# Patient Record
Sex: Female | Born: 1970 | Race: Black or African American | Hispanic: No | Marital: Married | Smoking: Former smoker
Health system: Southern US, Community
[De-identification: ages and names within clinical notes are randomized; demographics above are authoritative.]

## PROBLEM LIST (undated history)

## (undated) DIAGNOSIS — R42 Dizziness and giddiness: Secondary | ICD-10-CM

## (undated) DIAGNOSIS — I10 Essential (primary) hypertension: Secondary | ICD-10-CM

## (undated) DIAGNOSIS — E119 Type 2 diabetes mellitus without complications: Secondary | ICD-10-CM

## (undated) HISTORY — DX: Essential (primary) hypertension: I10

## (undated) HISTORY — PX: BUNIONECTOMY: SHX129

## (undated) HISTORY — PX: HERNIA REPAIR: SHX51

## (undated) HISTORY — DX: Dizziness and giddiness: R42

## (undated) HISTORY — DX: Type 2 diabetes mellitus without complications: E11.9

## (undated) HISTORY — PX: SHOULDER ARTHROSCOPY: SHX128

## (undated) HISTORY — PX: ABDOMINAL HYSTERECTOMY: SHX81

## (undated) HISTORY — PX: CHOLECYSTECTOMY: SHX55

---

## 2002-03-09 ENCOUNTER — Encounter: Admission: RE | Admit: 2002-03-09 | Discharge: 2002-03-09 | Payer: Self-pay | Admitting: *Deleted

## 2002-04-30 ENCOUNTER — Other Ambulatory Visit: Admission: RE | Admit: 2002-04-30 | Discharge: 2002-04-30 | Payer: Self-pay | Admitting: *Deleted

## 2002-04-30 ENCOUNTER — Encounter: Admission: RE | Admit: 2002-04-30 | Discharge: 2002-04-30 | Payer: Self-pay | Admitting: *Deleted

## 2002-05-14 ENCOUNTER — Encounter: Admission: RE | Admit: 2002-05-14 | Discharge: 2002-05-14 | Payer: Self-pay | Admitting: *Deleted

## 2012-05-25 ENCOUNTER — Other Ambulatory Visit (HOSPITAL_COMMUNITY)
Admission: RE | Admit: 2012-05-25 | Discharge: 2012-05-25 | Disposition: A | Discharge status: Home / Self Care | Payer: Self-pay | Source: Ambulatory Visit | Attending: Obstetrics and Gynecology | Admitting: Obstetrics and Gynecology

## 2012-05-25 ENCOUNTER — Encounter: Payer: Self-pay | Admitting: Obstetrics and Gynecology

## 2012-05-25 ENCOUNTER — Ambulatory Visit (INDEPENDENT_AMBULATORY_CARE_PROVIDER_SITE_OTHER): Payer: Self-pay | Admitting: Obstetrics and Gynecology

## 2012-05-25 VITALS — BP 143/95 | HR 67 | Temp 98.9°F | Ht 60.0 in

## 2012-05-25 DIAGNOSIS — D259 Leiomyoma of uterus, unspecified: Secondary | ICD-10-CM | POA: Insufficient documentation

## 2012-05-25 DIAGNOSIS — N920 Excessive and frequent menstruation with regular cycle: Secondary | ICD-10-CM | POA: Insufficient documentation

## 2012-05-25 DIAGNOSIS — I10 Essential (primary) hypertension: Secondary | ICD-10-CM | POA: Insufficient documentation

## 2012-05-25 DIAGNOSIS — R102 Pelvic and perineal pain: Secondary | ICD-10-CM | POA: Insufficient documentation

## 2012-05-25 HISTORY — DX: Leiomyoma of uterus, unspecified: D25.9

## 2012-05-25 HISTORY — DX: Essential (primary) hypertension: I10

## 2012-05-25 LAB — POCT URINALYSIS DIP (DEVICE)
Glucose, UA: NEGATIVE mg/dL
Hgb urine dipstick: NEGATIVE
Nitrite: NEGATIVE
Urobilinogen, UA: 0.2 mg/dL (ref 0.0–1.0)
pH: 5.5 (ref 5.0–8.0)

## 2012-05-25 NOTE — Progress Notes (Signed)
Subjective:    Patient ID: Lauren Soto, female    DOB: 15-Mar-1971, 41 y.o.   MRN: [id number]  HPI 41 yo presenting as a referral from [facility] for management of menorrhagia and pelvic pain. Patient describes monthly menses that over the past year have lasted for 2 weeks. Prior to her pregnancy 3 years ago, patient had normal cycles lasting 4 days. Following her delivery, she was started on Implanon and reported irregular menses. Following removal of Implanon, patient was started on Micronor secondary to hypertension and has experienced menses that last 2 weeks and are heavy in flow. Patient reports onset of pelvic pain over the past 2-3 months. It starts in the suprapubic area and radiates to the bilateral lower quadrants. She reports frequency and need to strain to urinate. She also reports some constipation. Pain does not seem to have an association with her cycle and is present on a daily basis. She denies any alleviating factors (she tried ibuprofen which put her to sleep) and any worsening factors.   Past Medical History  Diagnosis Date  . Hypertension   . Diabetes mellitus without complication     gestational   Past Surgical History  Procedure Date  . Shoulder arthroscopy   . Cholecystectomy   . Bunionectomy    Family History  Problem Relation Age of Onset  . Hypertension Mother   . Hypertension Father    History  Substance Use Topics  . Smoking status: Former Smoker    Types: Cigarettes  . Smokeless tobacco: Never Used  . Alcohol Use: No     Review of Systems  All other systems reviewed and are negative.       Objective:   Physical Exam  GENERAL: Well-developed, well-nourished female in no acute distress.  HEENT: Normocephalic, atraumatic. Sclerae anicteric.  NECK: Supple. Normal thyroid.  LUNGS: Clear to auscultation bilaterally.  HEART: Regular rate and rhythm. ABDOMEN: Soft, nontender, nondistended. No organomegaly. PELVIC: Normal external  female genitalia. Vagina is pink and rugated.  Normal discharge. Normal appearing cervix. Uterus is normal in size. No adnexal mass or tenderness. EXTREMITIES: No cyanosis, clubbing, or edema, 2+ distal pulses.     04/29/2012 ultrasound- 1.3 cm probable fibroid in uterine fundus without any submucosal component. 3.7 cm simple left ovarian cyst. Right ovary not visualized Assessment & Plan:  41 yo with menorrhagia and fibroid uterus - Discussed need to rule out endometrial cancer with endometrial biopsy.  ENDOMETRIAL BIOPSY     The indications for endometrial biopsy were reviewed.   Risks of the biopsy including cramping, bleeding, infection, uterine perforation, inadequate specimen and need for additional procedures  were discussed. The patient states she understands and agrees to undergo procedure today. Consent was signed. Time out was performed. Urine HCG was negative. A sterile speculum was placed in the patient's vagina and the cervix was prepped with Betadine. A single-toothed tenaculum was placed on the anterior lip of the cervix to stabilize it. The uterine cavity was sounded to a depth of 9 cm using the uterine sound. The 3 mm pipelle was introduced into the endometrial cavity without difficulty, 2 passes were made.  A  moderate amount of tissue was  sent to pathology. The instruments were removed from the patient's vagina. Minimal bleeding from the cervix was noted. The patient tolerated the procedure well.  Routine post-procedure instructions were given to the patient. The patient will follow up in two weeks to review the results and for further management.   -  Discussed medical management with Mirena IUD or depo-provera. Patient seemed to have a preference for Mirena IUD. - A UA was obtained and found to be negative. It was explained to the patient that if her cycles normalize but her pain persists, she will have to be seen by her primary care physician for further evaluation of her  pain. - RTC in 2 weeks for results and further management.

## 2012-05-25 NOTE — Patient Instructions (Signed)

## 2012-06-08 ENCOUNTER — Encounter: Payer: Self-pay | Admitting: Obstetrics and Gynecology

## 2012-06-08 ENCOUNTER — Ambulatory Visit (INDEPENDENT_AMBULATORY_CARE_PROVIDER_SITE_OTHER): Payer: Self-pay | Admitting: Obstetrics and Gynecology

## 2012-06-08 VITALS — BP 153/112 | HR 103 | Temp 99.0°F | Ht 60.0 in | Wt 187.0 lb

## 2012-06-08 DIAGNOSIS — R102 Pelvic and perineal pain unspecified side: Secondary | ICD-10-CM | POA: Insufficient documentation

## 2012-06-08 DIAGNOSIS — N920 Excessive and frequent menstruation with regular cycle: Secondary | ICD-10-CM

## 2012-06-08 DIAGNOSIS — N949 Unspecified condition associated with female genital organs and menstrual cycle: Secondary | ICD-10-CM

## 2012-06-08 DIAGNOSIS — D259 Leiomyoma of uterus, unspecified: Secondary | ICD-10-CM

## 2012-06-08 HISTORY — DX: Pelvic and perineal pain: R10.2

## 2012-06-08 HISTORY — DX: Excessive and frequent menstruation with regular cycle: N92.0

## 2012-06-08 NOTE — Progress Notes (Signed)
Patient ID: Lauren Soto, female   DOB: [birth year], 41 y.o.   MRN: [id number] 41 yo G2P2 with menorrhagia presenting today for results of endometrial biopsy. Results of biopsy were discussed and explained to the patient which demonstrated polypoid type- endometrium and benign for hyperplasia and malignancy.  Patient desires to proceed with Mirena IUD as previously discussed.  RTC for IUD insertion

## 2014-04-21 DIAGNOSIS — J45909 Unspecified asthma, uncomplicated: Secondary | ICD-10-CM | POA: Insufficient documentation

## 2014-04-21 HISTORY — DX: Unspecified asthma, uncomplicated: J45.909

## 2014-05-02 ENCOUNTER — Encounter: Payer: Self-pay | Admitting: Obstetrics and Gynecology

## 2014-08-11 ENCOUNTER — Other Ambulatory Visit (HOSPITAL_COMMUNITY): Payer: Self-pay | Admitting: Family Medicine

## 2014-08-11 ENCOUNTER — Ambulatory Visit (HOSPITAL_COMMUNITY)
Admission: RE | Admit: 2014-08-11 | Discharge: 2014-08-11 | Disposition: A | Discharge status: Home / Self Care | Payer: 59 | Source: Ambulatory Visit | Attending: Family Medicine | Admitting: Family Medicine

## 2014-08-11 DIAGNOSIS — M7989 Other specified soft tissue disorders: Secondary | ICD-10-CM | POA: Diagnosis not present

## 2015-08-23 IMAGING — US US EXTREM LOW*L* LIMITED
1 series · 14 of 25 positions shown · non-contrast
Comparison: Opposite lower leg.

CLINICAL DATA: Swelling of the left leg.

EXAM:
ULTRASOUND LEFT LOWER EXTREMITY LIMITED
TECHNIQUE: Ultrasound examination of the lower extremity soft tissues was
performed in the area of clinical concern.

[Series 1: us extrem low*left* limited · 0.08mm/px · 14 of 36 slices shown]
[im 1/36]
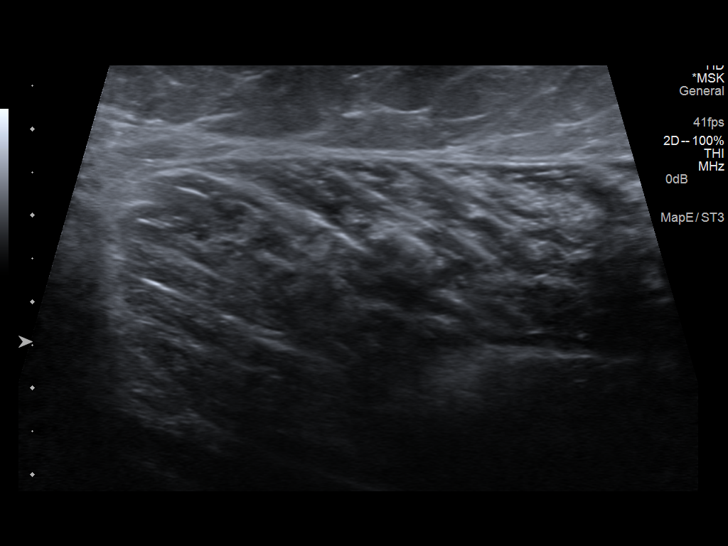
[im 3/36]
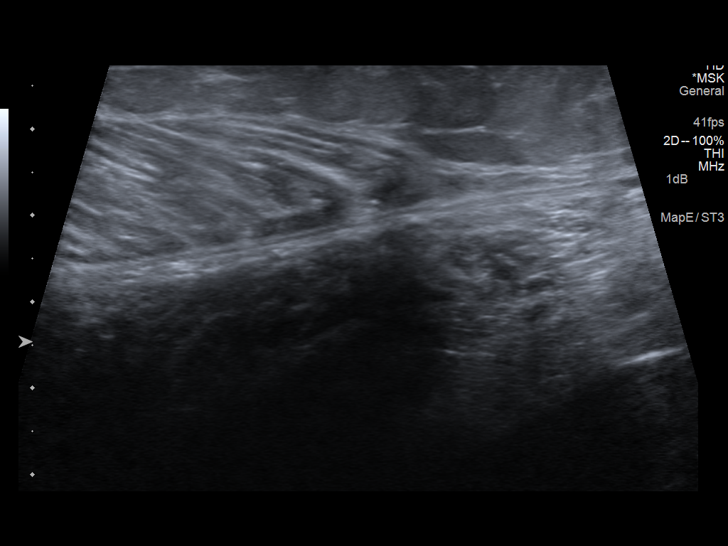
[im 6/36]
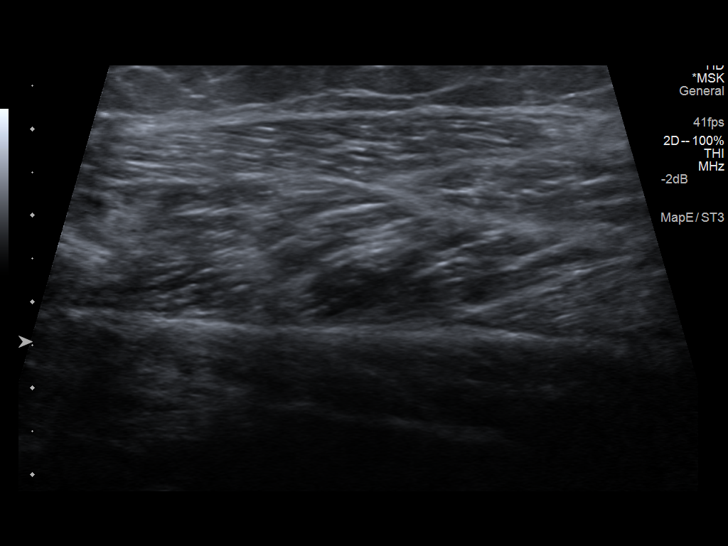
[im 9/36]
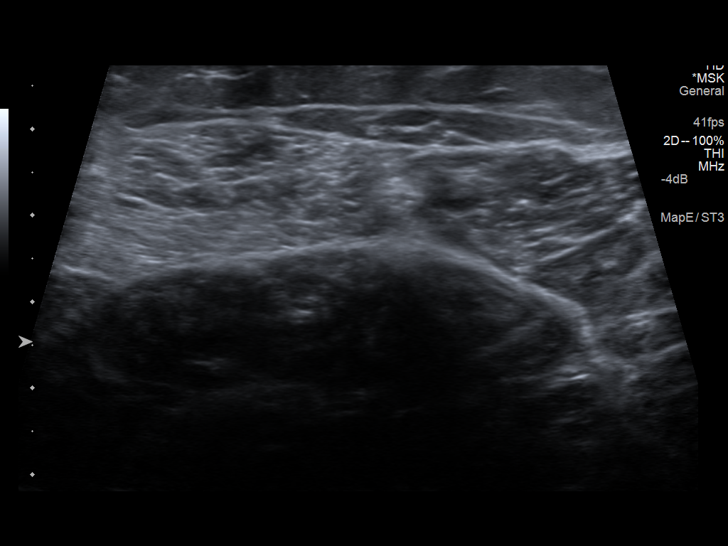
[im 12/36]
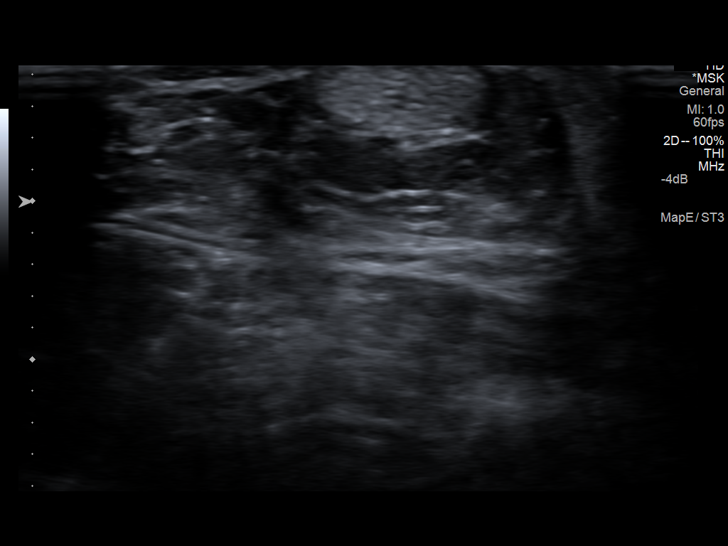
[im 14/36]
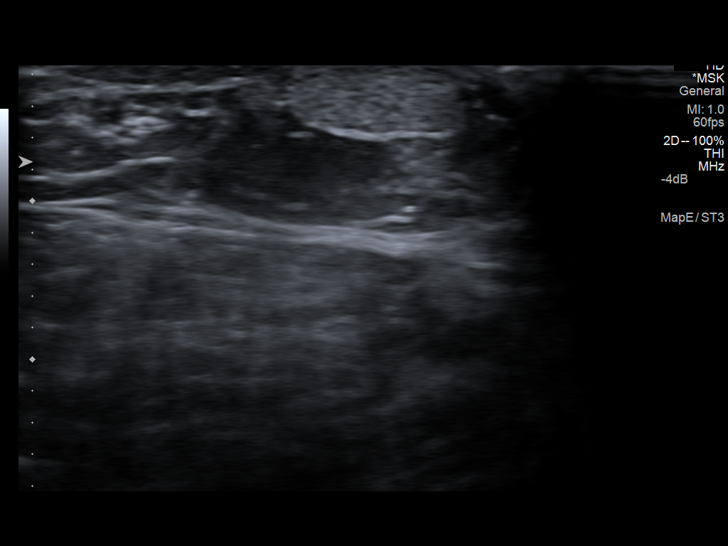
[im 17/36]
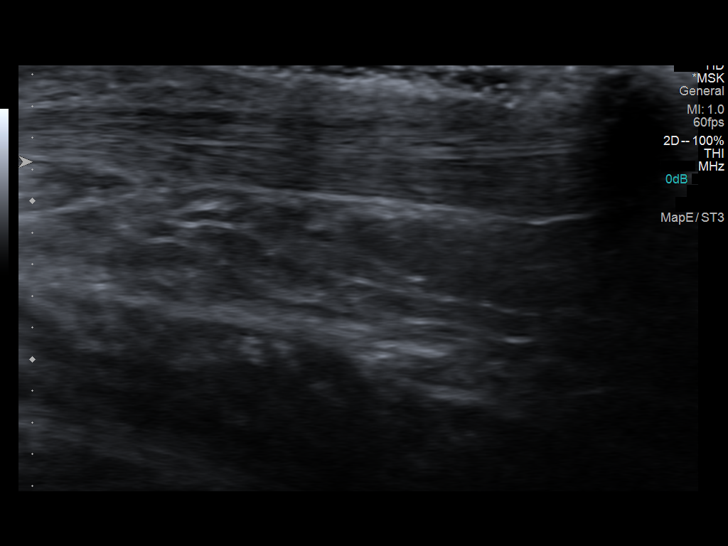
[im 19/36]
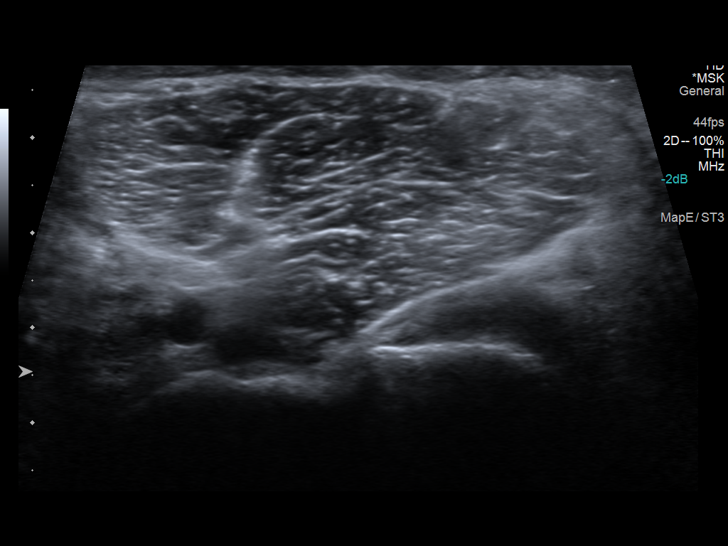
[im 22/36]
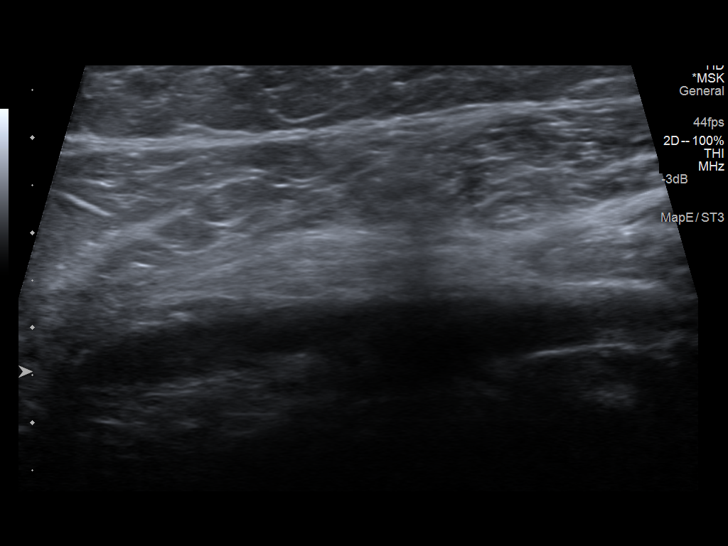
[im 24/36]
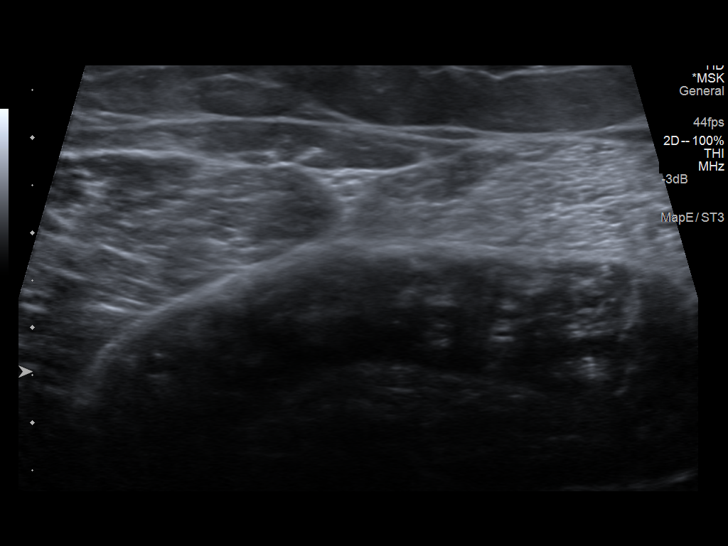
[im 27/36]
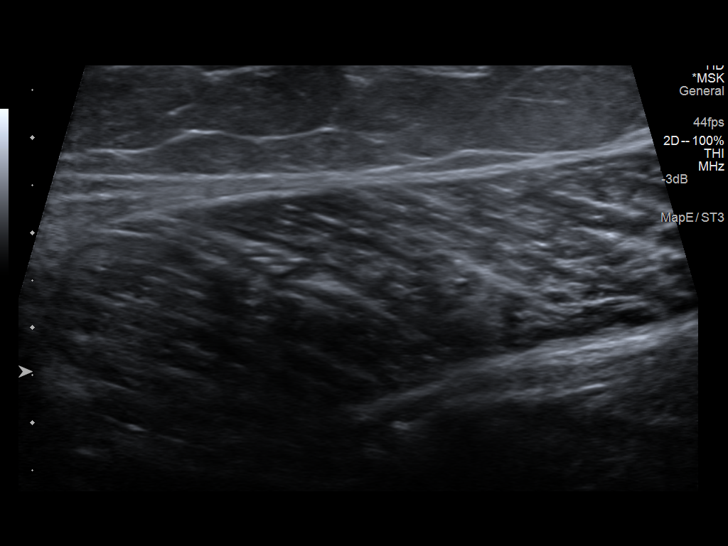
[im 30/36]
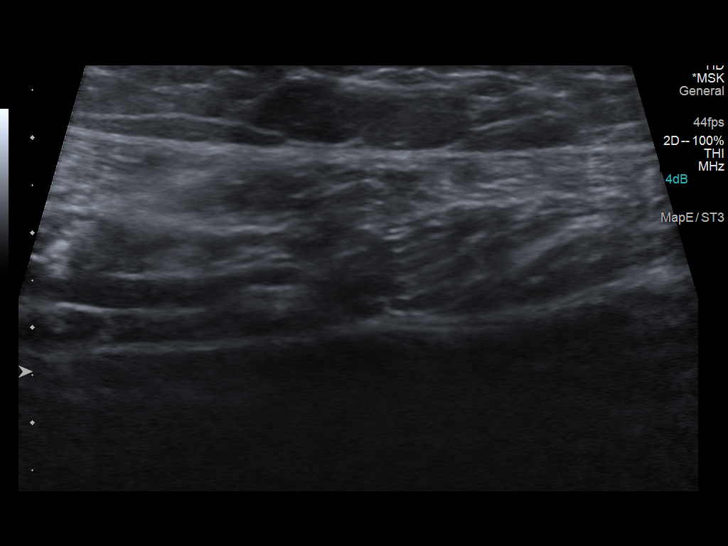
[im 33/36]
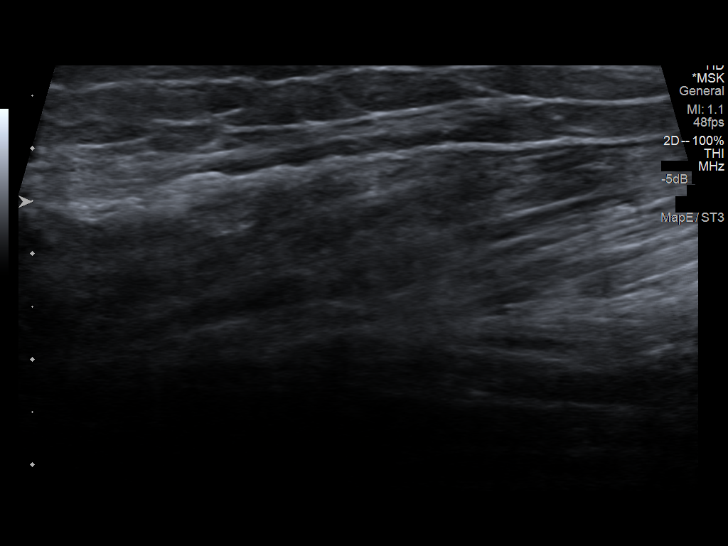
[im 36/36]
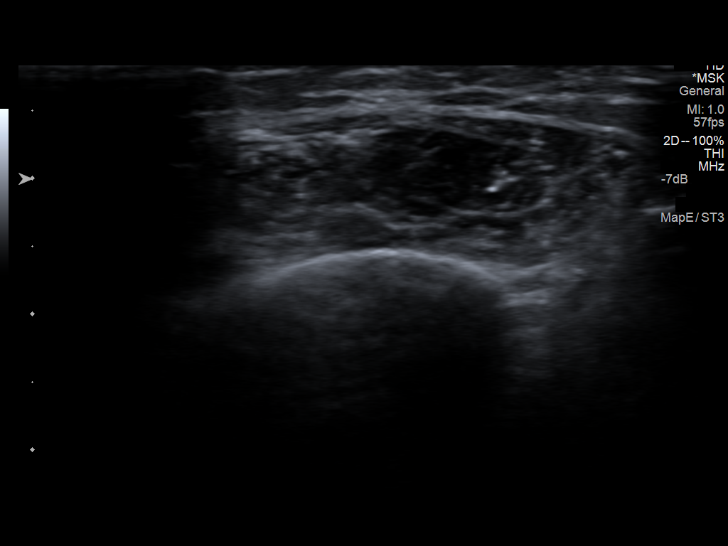

[14 of 25 positions shown; findings below may reference images not displayed]

FINDINGS: The gastrocnemius muscles and Achilles tendon and soleus muscle
appear normal. There is no abscess or mass or fluid in the soft
tissues. There is no Baker's cyst around the knee.
IMPRESSION: No visible abnormality of the soft tissues of the left lower leg. I
do not find an explanation for the patient's lower leg swelling.

## 2017-12-11 ENCOUNTER — Other Ambulatory Visit: Payer: Self-pay

## 2017-12-11 ENCOUNTER — Emergency Department (HOSPITAL_BASED_OUTPATIENT_CLINIC_OR_DEPARTMENT_OTHER): Payer: Self-pay

## 2017-12-11 ENCOUNTER — Emergency Department (HOSPITAL_BASED_OUTPATIENT_CLINIC_OR_DEPARTMENT_OTHER)
Admission: EM | Admit: 2017-12-11 | Discharge: 2017-12-11 | Disposition: A | Discharge status: Home / Self Care | Payer: Self-pay | Attending: Emergency Medicine | Admitting: Emergency Medicine

## 2017-12-11 ENCOUNTER — Encounter (HOSPITAL_BASED_OUTPATIENT_CLINIC_OR_DEPARTMENT_OTHER): Payer: Self-pay

## 2017-12-11 DIAGNOSIS — R079 Chest pain, unspecified: Secondary | ICD-10-CM | POA: Insufficient documentation

## 2017-12-11 DIAGNOSIS — Z79899 Other long term (current) drug therapy: Secondary | ICD-10-CM | POA: Insufficient documentation

## 2017-12-11 DIAGNOSIS — Z87891 Personal history of nicotine dependence: Secondary | ICD-10-CM | POA: Insufficient documentation

## 2017-12-11 DIAGNOSIS — I10 Essential (primary) hypertension: Secondary | ICD-10-CM | POA: Insufficient documentation

## 2017-12-11 LAB — COMPREHENSIVE METABOLIC PANEL
ALK PHOS: 95 U/L (ref 38–126)
ALT: 12 U/L — ABNORMAL LOW (ref 14–54)
ANION GAP: 9 (ref 5–15)
AST: 17 U/L (ref 15–41)
Albumin: 3.9 g/dL (ref 3.5–5.0)
BILIRUBIN TOTAL: 0.4 mg/dL (ref 0.3–1.2)
BUN: 15 mg/dL (ref 6–20)
CALCIUM: 9.4 mg/dL (ref 8.9–10.3)
CO2: 27 mmol/L (ref 22–32)
Chloride: 101 mmol/L (ref 101–111)
Creatinine, Ser: 0.9 mg/dL (ref 0.44–1.00)
GFR calc Af Amer: >60 mL/min (ref >60)
GFR calc non Af Amer: >60 mL/min (ref >60)
Glucose, Bld: 97 mg/dL (ref 65–99)
POTASSIUM: 3.5 mmol/L (ref 3.5–5.1)
SODIUM: 137 mmol/L (ref 135–145)
TOTAL PROTEIN: 7.7 g/dL (ref 6.5–8.1)

## 2017-12-11 LAB — CBC WITH DIFFERENTIAL/PLATELET
BASOS ABS: 0 10*3/uL (ref 0.0–0.1)
Basophils Relative: 0 %
EOS ABS: 0.1 10*3/uL (ref 0.0–0.7)
Eosinophils Relative: 1 %
HEMATOCRIT: 36.1 % (ref 36.0–46.0)
HEMOGLOBIN: 12.4 g/dL (ref 12.0–15.0)
Lymphocytes Relative: 29 %
Lymphs Abs: 2.3 10*3/uL (ref 0.7–4.0)
MCH: 29.7 pg (ref 26.0–34.0)
MCHC: 34.3 g/dL (ref 30.0–36.0)
MCV: 86.4 fL (ref 78.0–100.0)
Monocytes Absolute: 0.7 10*3/uL (ref 0.1–1.0)
Monocytes Relative: 8 %
NEUTROS ABS: 4.9 10*3/uL (ref 1.7–7.7)
NEUTROS PCT: 62 %
Platelets: 301 10*3/uL (ref 150–400)
RBC: 4.18 MIL/uL (ref 3.87–5.11)
RDW: 13.5 % (ref 11.5–15.5)
WBC: 8 10*3/uL (ref 4.0–10.5)

## 2017-12-11 LAB — D-DIMER, QUANTITATIVE: D-Dimer, Quant: 0.35 ug/mL-FEU (ref 0.00–0.50)

## 2017-12-11 LAB — TROPONIN I: Troponin I: <0.03 ng/mL (ref <0.03)

## 2017-12-11 MED ORDER — KETOROLAC TROMETHAMINE 30 MG/ML IJ SOLN
30.0000 mg | Freq: Once | INTRAMUSCULAR | Status: AC
  Administered 2017-12-11: 30 mg via INTRAVENOUS
  Filled 2017-12-11: qty 1

## 2017-12-11 MED ORDER — METHOCARBAMOL 500 MG PO TABS: 500.0000 mg | ORAL_TABLET | Freq: Two times a day (BID) | ORAL | 0 refills | Status: DC

## 2017-12-11 NOTE — ED Triage Notes (Signed)
Centralized chest pain started yesterday while at work. Radiated to left arm and left side of neck. Now reports pain started again 30 minutes ago, constant. Reports shortness of breath. Reports nausea.

## 2017-12-11 NOTE — ED Provider Notes (Signed)
[place] EMERGENCY DEPARTMENT Provider Note   CSN: [id number] Arrival date & time: 12/11/17  1425     History   Chief Complaint Chief Complaint  Patient presents with  . Chest Pain    HPI Lauren Soto is a 47 y.o. female with PMH/o HTN, DM who presents for evaluation of intermittent chest pressure that began yesterday while working.  Patient reports that yesterday, she started having some diffuse chest pressure while she was taking close.  Patient reports that it was worse when she was moving, twisting her torso or moving her arms.  Patient reports that she sat down and after about 30 minutes, pain went away on her own.  Patient reports she had been experiencing some left-sided neck pain that she describes as a "sharp shooting pain" that went down her left upper extremity.  No numbness/weakness.  No preceding trauma, injury, fall.  Patient reports that today, she was at work hanging up close when she felt the same chest pressure began approximately 1:30 PM.  Patient reports that she was still having pain so she came to the emergency department.  Patient reports that she does like she is having to take "shallow breaths because it is hurting" which may make her have some difficulty breathing.  She is also reporting that she is having some mild numbness.  Denies any vomiting.  Patient states she did not have any diaphoresis or nausea with the pain.  She states that the pain was not worse with exertion.  She does report some worsening pain with deep inspiration.  Patient reports she has seen a cardiologist previously but does not know why she had saw them.  No history of MIs.  She is not a current smoker denies any cocaine use. She denies any OCP use, recent immobilization, prior history of DVT/PE, recent surgery, leg swelling, or long travel.  Patient denies any family history of heart attacks.  Patient denies any recent fevers, headaches, vision changes, vomiting, abdominal pain.  The  history is provided by the patient.    Past Medical History:  Diagnosis Date  . Diabetes mellitus without complication ([place])    gestational  . Hypertension     Patient Active Problem List   Diagnosis Date Noted  . Menorrhagia 06/08/2012  . Pelvic pain in female 06/08/2012  . Hypertension 05/25/2012  . Uterine fibroid 05/25/2012    Past Surgical History:  Procedure Laterality Date  . BUNIONECTOMY    . CHOLECYSTECTOMY    . SHOULDER ARTHROSCOPY       OB History    Gravida  2   Para  2   Term  2   Preterm  0   AB  0   Living  2     SAB  0   TAB  0   Ectopic  0   Multiple  0   Live Births               Home Medications    Prior to Admission medications   Medication Sig Start Date End Date Taking? Authorizing Provider  ferrous fumarate (HEMOCYTE - 106 MG FE) 325 (106 FE) MG TABS Take 1 tablet by mouth.    [provider]  lisinopril (PRINIVIL,ZESTRIL) 20 MG tablet Take 20 mg by mouth daily.    [provider]  methocarbamol (ROBAXIN) 500 MG tablet Take 1 tablet (500 mg total) by mouth 2 (two) times daily. 12/11/17   Volanda Napoleon, PA-C  metoprolol  succinate (TOPROL-XL) 50 MG 24 hr tablet Take 50 mg by mouth daily. Take with or immediately following a meal.    [provider]    Family History Family History  Problem Relation Age of Onset  . Hypertension Mother   . Hypertension Father     Social History Social History   Tobacco Use  . Smoking status: Former Smoker    Types: Cigarettes  . Smokeless tobacco: Never Used  Substance Use Topics  . Alcohol use: No  . Drug use: No     Allergies   Patient has no known allergies.   Review of Systems Review of Systems  Constitutional: Negative for fever.  Respiratory: Positive for shortness of breath. Negative for cough.   Cardiovascular: Positive for chest pain. Negative for leg swelling.  Gastrointestinal: Positive for nausea. Negative for abdominal pain and  vomiting.  Genitourinary: Negative for dysuria and hematuria.  Neurological: Negative for headaches.  All other systems reviewed and are negative.    Physical Exam Updated Vital Signs BP 102/68 (BP Location: Right Arm)   Pulse 60   Temp 98.8 F (37.1 C) (Oral)   Resp 16   Ht 5' (1.524 m)   Wt 86.2 kg (190 lb)   LMP 06/04/2012   SpO2 97%   BMI 37.11 kg/m   Physical Exam  Constitutional: She is oriented to person, place, and time. She appears well-developed and well-nourished.  HENT:  Head: Normocephalic and atraumatic.  Mouth/Throat: Oropharynx is clear and moist and mucous membranes are normal.  Eyes: Pupils are equal, round, and reactive to light. Conjunctivae, EOM and lids are normal.  Neck: Full passive range of motion without pain.  Full flexion/extension and lateral movement of neck fully intact.  Tenderness palpation noted to the left-sided paraspinal muscles of the cervical region.  Positive Spurling's maneuver.  No bony midline tenderness. No deformities or crepitus.   Cardiovascular: Normal rate, regular rhythm, normal heart sounds and normal pulses. Exam reveals no gallop and no friction rub.  No murmur heard. Pulses:      Carotid pulses are 2+ on the right side, and 2+ on the left side.      Dorsalis pedis pulses are 2+ on the right side, and 2+ on the left side.  Pulmonary/Chest: Effort normal and breath sounds normal.  Lungs clear to auscultation bilaterally.  Symmetric chest rise.  No wheezing, rales, rhonchi.  Tenderness palpation noted to the anterior chest wall which reproduces patient's pain.  Additionally, pain is reproduced with movement of her upper extremities.  No deformity or crepitus noted.  Abdominal: Soft. Normal appearance. There is no tenderness. There is no rigidity and no guarding.  Musculoskeletal: Normal range of motion.  Bilateral lower extremities are symmetric in appearance without any overlying edema, erythema.  Neurological: She is alert  and oriented to person, place, and time.  Skin: Skin is warm and dry. Capillary refill takes less than 2 seconds.  Psychiatric: She has a normal mood and affect. Her speech is normal.  Nursing note and vitals reviewed.   ED Treatments / Results  Labs (all labs ordered are listed, but only abnormal results are displayed) Labs Reviewed  COMPREHENSIVE METABOLIC PANEL - Abnormal; Notable for the following components:      Result Value   ALT 12 (*)    All other components within normal limits  CBC WITH DIFFERENTIAL/PLATELET  TROPONIN I  D-DIMER, QUANTITATIVE (NOT AT [facility])  TROPONIN I    EKG None   EKG  shows normal sinus rhythm, rate 66.  QTc is 421.  Radiology Dg Chest 2 View  Result Date: 12/11/2017 CLINICAL DATA:  Chest pain and shortness of breath EXAM: CHEST - 2 VIEW COMPARISON:  August 28, 2011 FINDINGS: There is no edema or consolidation. The heart size and pulmonary vascularity are normal. No adenopathy. No pneumothorax. No bone lesions. IMPRESSION: No edema or consolidation. Electronically Signed   By: Lowella Grip III M.D.   On: 12/11/2017 15:31    Procedures Procedures (including critical care time)  Medications Ordered in ED Medications  ketorolac (TORADOL) 30 MG/ML injection 30 mg (30 mg Intravenous Given 12/11/17 1647)     Initial Impression / Assessment and Plan / ED Course  I have reviewed the triage vital signs and the nursing notes.  Pertinent labs & imaging results that were available during my care of the patient were reviewed by me and considered in my medical decision making (see chart for details).     47 year old female who presents for evaluation of chest pressure.  Initially began yesterday but resolved.  Reports it returned today approxi-1:30 PM had some nausea with it.  Also reports that she feels like she is having to take shallow breaths because the pain.  Also reports she had some left-sided neck and arm pain prior to onset of symptoms.  Patient is afebrile, non-toxic appearing, sitting comfortably on examination table. Vital signs reviewed and stable.  On exam, pain is reproduced with palpation of anterior chest wall and with movement of her upper extremities.  Additionally, patient has positive Spurling's maneuver.  Suspect the patient may be having some radiculopathy pain that is concerning the neck and arm pain.  It sounds like this pain was going on before the chest pain so that it may not be related to her chest pain at all.  Additionally, consider ACS etiology versus infectious etiology versus electrolyte imbalance.  Low suspicion for PE but given deep inspiration component, also consideration.  Plan to check basic labs, EKG, chest x-ray.  Troponin negative.  CBC without any significant leukocytosis, anemia.  CMP without any acute abnormality.  D-dimer negative.  Chest x-ray negative for any acute infectious etiology.  Given patient's risk factors, presentation, she has a heart score of 3.  Will plan to delta troponin.  Updated patient on plan.  She reports some improvement in pain after analgesics.  Vital signs are stable.  We will plan for delta troponin.  Repeat troponin negative.  Discussed results with patient.  Vital signs are stable.  Suspect that this is musculoskeletal strain given distribution of pain and reproducible with palpation.  We will plan to treat with Robaxin.  Patient is here to follow-up with primary care doctor in the next 2 to 4 days for further evaluation.  At this time, do not suspect any acute life-threatening presentation.  Patient stable for discharge at this time. Patient had ample opportunity for questions and discussion. All patient's questions were answered with full understanding. Strict return precautions discussed. Patient expresses understanding and agreement to plan.   Final Clinical Impressions(s) / ED Diagnoses   Final diagnoses:  Chest pain, unspecified type    ED Discharge Orders         Ordered    methocarbamol (ROBAXIN) 500 MG tablet  2 times daily     12/11/17 1842       Volanda Napoleon, PA-C 12/12/17 0114    Tanna Furry, MD 12/20/17 2315

## 2017-12-11 NOTE — Discharge Instructions (Signed)
You can take Tylenol or Ibuprofen as directed for pain. You can alternate Tylenol and Ibuprofen every 4 hours. If you take Tylenol at 1pm, then you can take Ibuprofen at 5pm. Then you can take Tylenol again at 9pm.   Take Robaxin as prescribed. This medication will make you drowsy so do not drive or drink alcohol when taking it.  Follow-up with your primary doctor or the referred [facility].   Return to the Emergency Department immediately if you experiencing worsening chest pain, difficulty breathing, nausea/vomiting, get very sweaty, headache or any other worsening or concerning symptoms.

## 2018-06-18 DIAGNOSIS — E119 Type 2 diabetes mellitus without complications: Secondary | ICD-10-CM

## 2018-06-18 HISTORY — DX: Type 2 diabetes mellitus without complications: E11.9

## 2018-07-06 DIAGNOSIS — G43809 Other migraine, not intractable, without status migrainosus: Secondary | ICD-10-CM | POA: Insufficient documentation

## 2018-07-06 HISTORY — DX: Other migraine, not intractable, without status migrainosus: G43.809

## 2018-12-23 IMAGING — CR DG CHEST 2V
2 series · 2 of 2 positions shown · non-contrast
Comparison: August 28, 2011

CLINICAL DATA: Chest pain and shortness of breath

EXAM:
CHEST - 2 VIEW

[w chest pa]
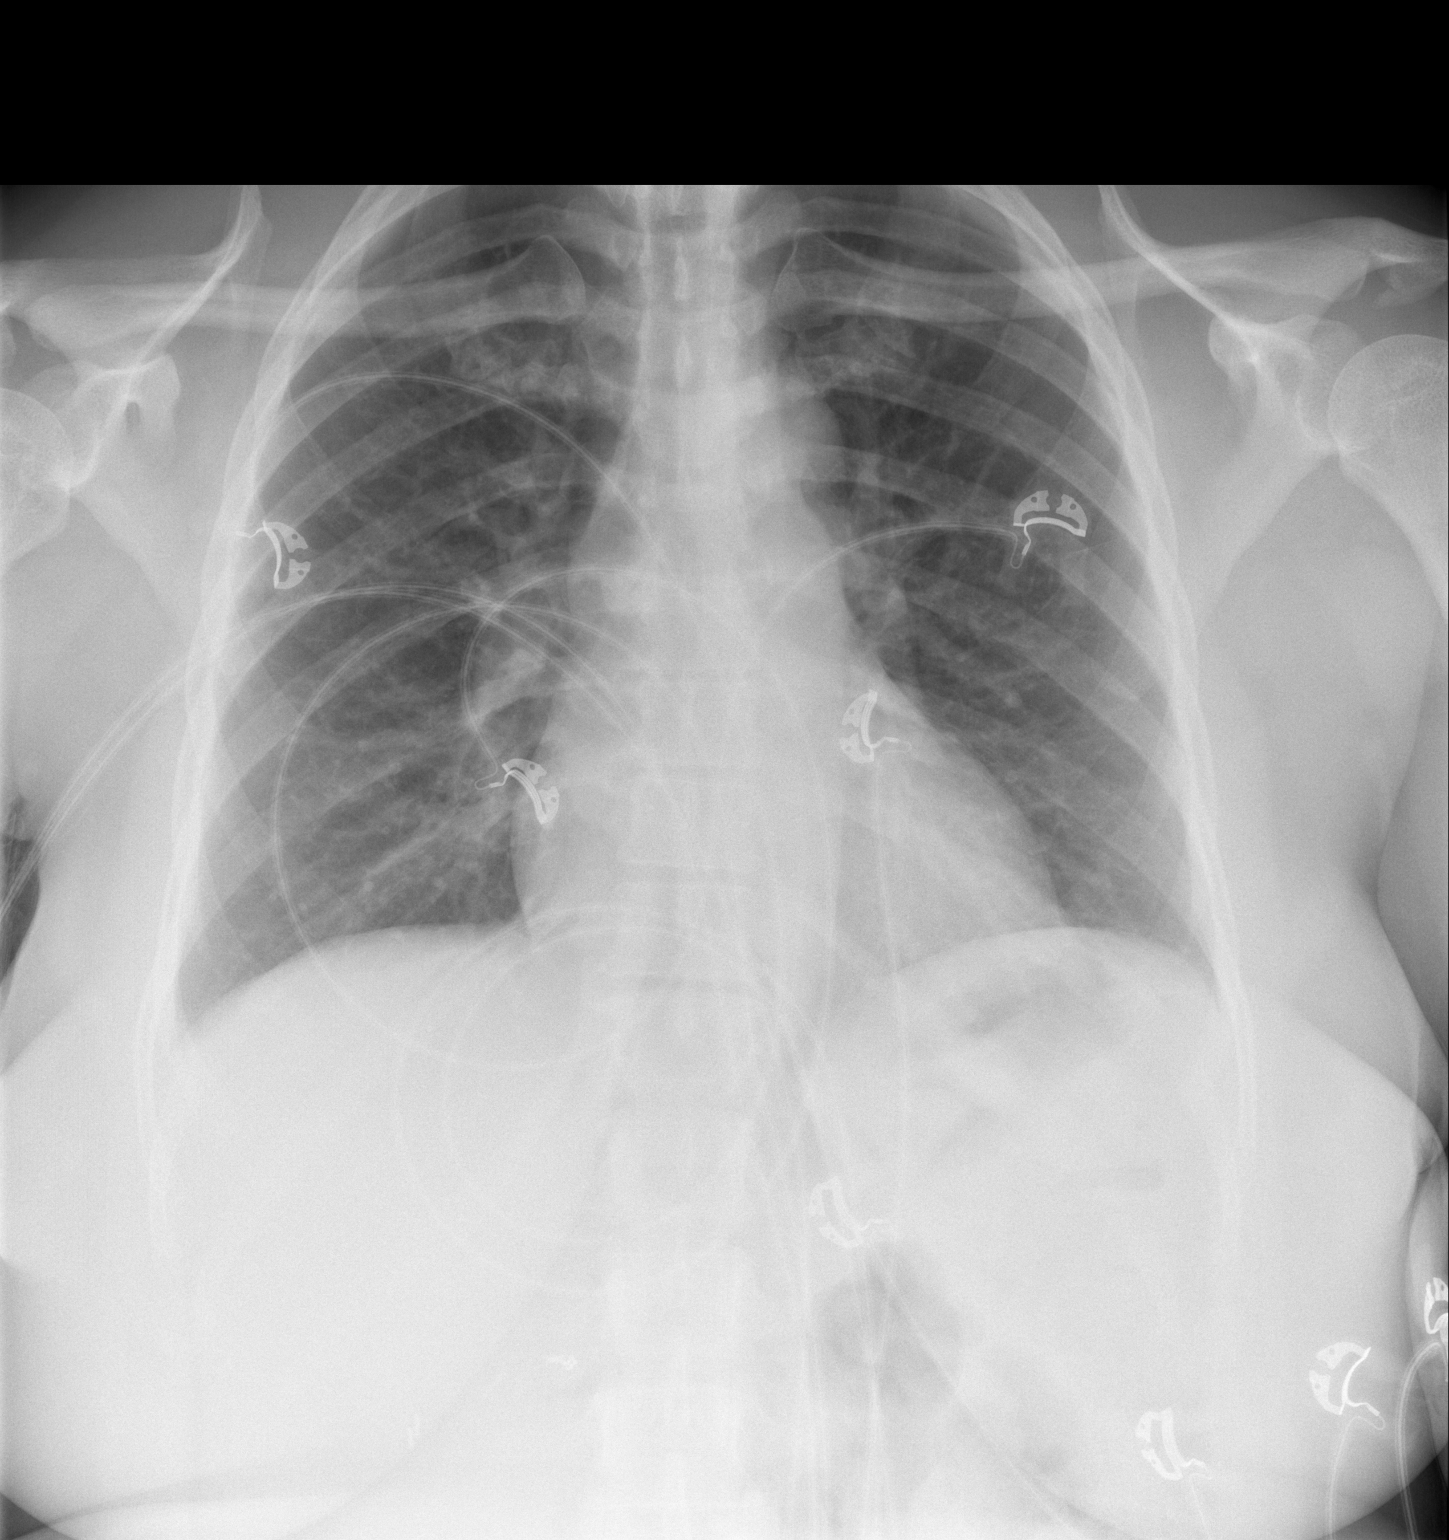

[w chest lat]
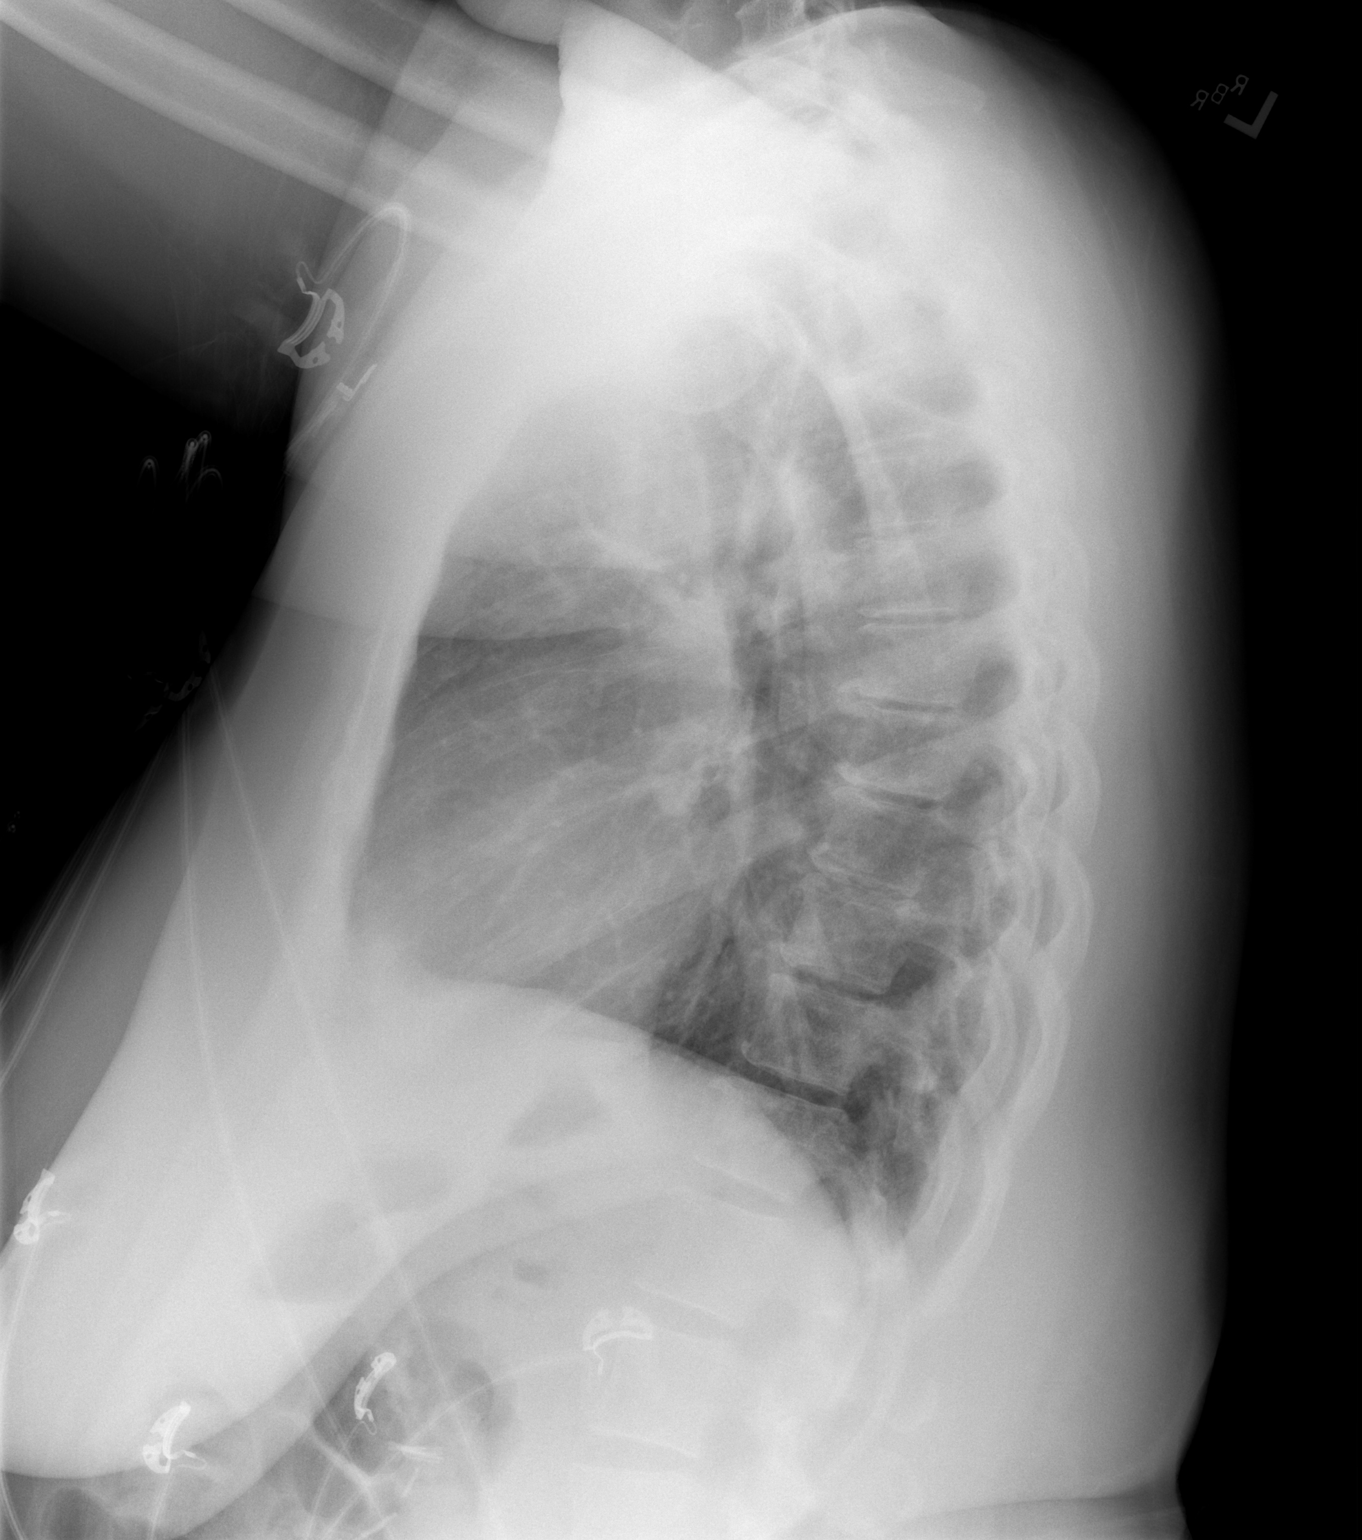

[2 of 2 positions shown; findings below may reference images not displayed]

FINDINGS: There is no edema or consolidation. The heart size and pulmonary
vascularity are normal. No adenopathy. No pneumothorax. No bone
lesions.
IMPRESSION: No edema or consolidation.

## 2020-10-01 ENCOUNTER — Encounter (HOSPITAL_BASED_OUTPATIENT_CLINIC_OR_DEPARTMENT_OTHER): Payer: Self-pay | Admitting: *Deleted

## 2020-10-01 ENCOUNTER — Emergency Department (HOSPITAL_BASED_OUTPATIENT_CLINIC_OR_DEPARTMENT_OTHER)
Admission: EM | Admit: 2020-10-01 | Discharge: 2020-10-01 | Disposition: A | Discharge status: Home / Self Care | Payer: Self-pay | Attending: Physician Assistant | Admitting: Physician Assistant

## 2020-10-01 ENCOUNTER — Other Ambulatory Visit: Payer: Self-pay

## 2020-10-01 DIAGNOSIS — G8929 Other chronic pain: Secondary | ICD-10-CM | POA: Insufficient documentation

## 2020-10-01 DIAGNOSIS — E119 Type 2 diabetes mellitus without complications: Secondary | ICD-10-CM | POA: Insufficient documentation

## 2020-10-01 DIAGNOSIS — Z87891 Personal history of nicotine dependence: Secondary | ICD-10-CM | POA: Insufficient documentation

## 2020-10-01 DIAGNOSIS — M79602 Pain in left arm: Secondary | ICD-10-CM | POA: Insufficient documentation

## 2020-10-01 DIAGNOSIS — Z79899 Other long term (current) drug therapy: Secondary | ICD-10-CM | POA: Insufficient documentation

## 2020-10-01 DIAGNOSIS — M2550 Pain in unspecified joint: Secondary | ICD-10-CM | POA: Insufficient documentation

## 2020-10-01 DIAGNOSIS — I10 Essential (primary) hypertension: Secondary | ICD-10-CM | POA: Insufficient documentation

## 2020-10-01 DIAGNOSIS — M542 Cervicalgia: Secondary | ICD-10-CM | POA: Insufficient documentation

## 2020-10-01 DIAGNOSIS — M79601 Pain in right arm: Secondary | ICD-10-CM | POA: Insufficient documentation

## 2020-10-01 MED ORDER — CYCLOBENZAPRINE HCL 10 MG PO TABS: 10.0000 mg | ORAL_TABLET | Freq: Two times a day (BID) | ORAL | 0 refills | Status: DC | PRN

## 2020-10-01 NOTE — ED Triage Notes (Signed)
'  upper body pain' x several weeks. Bilateral arm, neck pain. Pt has been seen by provider and told that she had carpal tunnel. Ambulatory. No acute distress noted.

## 2020-10-01 NOTE — ED Provider Notes (Signed)
[facility] EMERGENCY DEPARTMENT Provider Note   CSN: [id number] Arrival date & time: 10/01/20  1044     History Chief Complaint  Patient presents with  . Generalized Body Aches    Lauren Soto is a 50 y.o. female with past medical history of HTN who presents the ED with complaints of bilateral arm and neck discomfort.  I reviewed patient's medical record and she was evaluated by [facility] at [facility] on 07/06/2020 for her symptoms of bilateral arm and hand numbness and tingling discomfort.  She reported that her symptoms have been going on since her 90s, but has been increasingly problematic for her.  Her symptoms had not improved with meloxicam or gabapentin.  She was offered EMG/NCV to evaluate for carpal tunnel syndrome, but she declined.  That was his suspected diagnosis.  She was encouraged to return to the clinic in 2 months.  She was also started on Lyrica.  On my examination, patient states that she has been having continued if not worsening pain in her bilateral arms and bilateral side of neck.  No new traumas.  She states that she is working on paperwork to help with financial costs of proceeding with EMG with her neurologist.  She plans to call them to schedule appointment for ongoing evaluation early next week.  She also states that she has been having joint pains diffusely.  Denies any redness or severe pain, but states that they are "achy".  This has been going on for months.  She is concerned for possible autoimmune or inflammatory disorder.  She has never been seen by rheumatology.  She does have a primary care provider with [facility] and [place], [place].  She understands that she needs to see them to as her blood pressure was mildly elevated here in the ED.    She states that she has been taking her pregabalin, with no significant relief.  She has not been on any steroids or continued NSAIDs.  She states that she has spasm discomfort in her upper  neck and feels tense.  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  She denies any chest pain, difficult breathing, no recent injuries, inability to move her neck, blurred vision, headache, or other neurologic deficits.   HPI     Past Medical History:  Diagnosis Date  . Diabetes mellitus without complication ([place])    gestational  . Hypertension     Patient Active Problem List   Diagnosis Date Noted  . Menorrhagia 06/08/2012  . Pelvic pain in female 06/08/2012  . Hypertension 05/25/2012  . Uterine fibroid 05/25/2012    Past Surgical History:  Procedure Laterality Date  . BUNIONECTOMY    . CHOLECYSTECTOMY    . SHOULDER ARTHROSCOPY       OB History    Gravida  2   Para  2   Term  2   Preterm  0   AB  0   Living  2     SAB  0   IAB  0   Ectopic  0   Multiple  0   Live Births              Family History  Problem Relation Age of Onset  . Hypertension Mother   . Hypertension Father     Social History   Tobacco Use  . Smoking status: Former Smoker    Types: Cigarettes  . Smokeless tobacco:  Never Used  Substance Use Topics  . Alcohol use: No  . Drug use: No    Home Medications Prior to Admission medications   Medication Sig Start Date End Date Taking? Authorizing Provider  cyclobenzaprine (FLEXERIL) 10 MG tablet Take 1 tablet (10 mg total) by mouth 2 (two) times daily as needed for muscle spasms. 10/01/20  Yes Corena Herter, PA-C  ferrous fumarate (HEMOCYTE - 106 MG FE) 325 (106 FE) MG TABS Take 1 tablet by mouth.    [provider]  lisinopril (PRINIVIL,ZESTRIL) 20 MG tablet Take 20 mg by mouth daily.    [provider]  methocarbamol (ROBAXIN) 500 MG tablet Take 1 tablet (500 mg total) by mouth 2 (two) times daily. 12/11/17   Volanda Napoleon, PA-C  metoprolol succinate (TOPROL-XL) 50 MG 24 hr tablet Take 50 mg by mouth daily. Take with or immediately following a meal.     [provider]    Allergies    Patient has no known allergies.  Review of Systems   Review of Systems  All other systems reviewed and are negative.   Physical Exam Updated Vital Signs BP (!) 155/108 (BP Location: Left Arm) Comment: noncompliant with BP meds  Pulse 75   Temp 98.2 F (36.8 C) (Oral)   Resp 18   Ht 5' (1.524 m)   Wt 88.5 kg   LMP 06/04/2012   SpO2 96%   BMI 38.08 kg/m   Physical Exam Vitals and nursing note reviewed. Exam conducted with a chaperone present.  Constitutional:      Appearance: Normal appearance.  HENT:     Head: Normocephalic and atraumatic.  Eyes:     General: No scleral icterus.    Conjunctiva/sclera: Conjunctivae normal.  Cardiovascular:     Rate and Rhythm: Normal rate.     Pulses: Normal pulses.  Pulmonary:     Effort: Pulmonary effort is normal. No respiratory distress.  Musculoskeletal:        General: Tenderness present. No swelling, deformity or signs of injury. Normal range of motion.     Comments: Tenderness over trapezius bilaterally.  No significant midline cervical pain.  No overlying skin changes.  No masses.  No midline spinal tenderness. Positive Tinel's sign bilaterally.  Tenderness over biceps bilaterally.  No redness or overlying skin changes.  No swelling or inflammatory changes.  She is able to demonstrate full ROM, albeit slowed due to discomfort.  She denies weakness and insists that the pain is limiting factor.  Sensation is grossly intact bilaterally and peripherally.  Radial pulses intact and symmetric.  Skin:    General: Skin is dry.  Neurological:     Mental Status: She is alert.     GCS: GCS eye subscore is 4. GCS verbal subscore is 5. GCS motor subscore is 6.  Psychiatric:        Mood and Affect: Mood normal.        Behavior: Behavior normal.        Thought Content: Thought content normal.     ED Results / Procedures / Treatments   Labs (all labs ordered are listed, but only abnormal results  are displayed) Labs Reviewed - No data to display  EKG None  Radiology No results found.  Procedures Procedures   Medications Ordered in ED Medications - No data to display  ED Course  I have reviewed the triage vital signs and the nursing notes.  Pertinent labs & imaging results that were available during  my care of the patient were reviewed by me and considered in my medical decision making (see chart for details).    MDM Rules/Calculators/A&P                          Lauren Soto was evaluated in Emergency Department on 10/01/2020 for the symptoms described in the history of present illness. She was evaluated in the context of the global COVID-19 pandemic, which necessitated consideration that the patient might be at risk for infection with the SARS-CoV-2 virus that causes COVID-19. Institutional protocols and algorithms that pertain to the evaluation of patients at risk for COVID-19 are in a state of rapid change based on information released by regulatory bodies including the CDC and federal and state organizations. These policies and algorithms were followed during the patient's care in the ED.  I personally reviewed patient's medical chart and all notes from triage and staff during today's encounter. I have also ordered and reviewed all labs and imaging that I felt to be medically necessary in the evaluation of this patient's complaints and with consideration of their physical exam. If needed, translation services were available and utilized.   Patient with chronic pains in her arms bilaterally.  No concerning findings on physical exam.  She also endorses generalized achy joints and is concerned for autoimmune disorder.  Cannot exclude RA or other autoimmune polyarthropathy, will refer to rheumatology for ongoing evaluation and management.  In the interim, will prescribe Flexeril as she states that this has helped her symptoms in the past.  Encouraged her to combine with her Lyrica  and NSAIDs as needed.  She denies any IVDA.  Her joints are not erythematous or swollen.  Do not feel as though imaging is warranted.  Patient can demonstrate full ROM.  She is not ill-appearing.  She will also follow-up with her neurologist for ongoing evaluation and management.  Encouraging her to see her primary care provider, as well, given elevated blood pressures here in the ED.  ED return precautions discussed.  Patient voices understanding and is agreeable to the plan.  Final Clinical Impression(s) / ED Diagnoses Final diagnoses:  Chronic pain of both upper extremities    Rx / DC Orders ED Discharge Orders         Ordered    cyclobenzaprine (FLEXERIL) 10 MG tablet  2 times daily PRN        10/01/20 1134           Reita Chard 10/01/20 1144    Long, Wonda Old[facility], MD 10/03/20 1011

## 2020-10-01 NOTE — Discharge Instructions (Addendum)
You were given a prescription for Flexeril which is a muscle relaxer.  You should not drive, work, consume alcohol, or operate machinery while taking this medication as it can make you very drowsy.    Continue to take your NSAIDs as needed as well as your Lyrica.  Please call your neurologist regarding today's encounter and to schedule appointment for close evaluation.  Have also provided referral to rheumatology given your concern for multiple joint involvement /inflammatory condition.  Your blood pressure was mildly elevated here in the ED, please see your primary care provider [facility].  You may need to have your blood pressure medications adjusted.  Return to the ED or seek immediate medical attention should you experience any new or worsening symptoms.

## 2021-11-21 ENCOUNTER — Emergency Department (HOSPITAL_BASED_OUTPATIENT_CLINIC_OR_DEPARTMENT_OTHER): Payer: Self-pay

## 2021-11-21 ENCOUNTER — Encounter (HOSPITAL_BASED_OUTPATIENT_CLINIC_OR_DEPARTMENT_OTHER): Payer: Self-pay | Admitting: Emergency Medicine

## 2021-11-21 ENCOUNTER — Emergency Department (HOSPITAL_BASED_OUTPATIENT_CLINIC_OR_DEPARTMENT_OTHER)
Admission: EM | Admit: 2021-11-21 | Discharge: 2021-11-21 | Disposition: A | Discharge status: Home / Self Care | Payer: Self-pay | Attending: Emergency Medicine | Admitting: Emergency Medicine

## 2021-11-21 ENCOUNTER — Other Ambulatory Visit: Payer: Self-pay

## 2021-11-21 DIAGNOSIS — R2240 Localized swelling, mass and lump, unspecified lower limb: Secondary | ICD-10-CM | POA: Insufficient documentation

## 2021-11-21 DIAGNOSIS — R0602 Shortness of breath: Secondary | ICD-10-CM | POA: Insufficient documentation

## 2021-11-21 DIAGNOSIS — M7989 Other specified soft tissue disorders: Secondary | ICD-10-CM

## 2021-11-21 DIAGNOSIS — I10 Essential (primary) hypertension: Secondary | ICD-10-CM | POA: Insufficient documentation

## 2021-11-21 LAB — CBC WITH DIFFERENTIAL/PLATELET
Abs Immature Granulocytes: 0.02 10*3/uL (ref 0.00–0.07)
Basophils Absolute: 0 10*3/uL (ref 0.0–0.1)
Basophils Relative: 0 %
Eosinophils Absolute: 0.1 10*3/uL (ref 0.0–0.5)
Eosinophils Relative: 1 %
HCT: 34.5 % — ABNORMAL LOW (ref 36.0–46.0)
Hemoglobin: 11.6 g/dL — ABNORMAL LOW (ref 12.0–15.0)
Immature Granulocytes: 0 %
Lymphocytes Relative: 38 %
Lymphs Abs: 3.4 10*3/uL (ref 0.7–4.0)
MCH: 29.4 pg (ref 26.0–34.0)
MCHC: 33.6 g/dL (ref 30.0–36.0)
MCV: 87.3 fL (ref 80.0–100.0)
Monocytes Absolute: 0.7 10*3/uL (ref 0.1–1.0)
Monocytes Relative: 8 %
Neutro Abs: 4.6 10*3/uL (ref 1.7–7.7)
Neutrophils Relative %: 53 %
Platelets: 299 10*3/uL (ref 150–400)
RBC: 3.95 MIL/uL (ref 3.87–5.11)
RDW: 15.9 % — ABNORMAL HIGH (ref 11.5–15.5)
WBC: 8.8 10*3/uL (ref 4.0–10.5)
nRBC: 0 % (ref 0.0–0.2)

## 2021-11-21 LAB — COMPREHENSIVE METABOLIC PANEL
ALT: 17 U/L (ref 0–44)
AST: 19 U/L (ref 15–41)
Albumin: 3.5 g/dL (ref 3.5–5.0)
Alkaline Phosphatase: 88 U/L (ref 38–126)
Anion gap: 8 (ref 5–15)
BUN: 9 mg/dL (ref 6–20)
CO2: 30 mmol/L (ref 22–32)
Calcium: 9.2 mg/dL (ref 8.9–10.3)
Chloride: 102 mmol/L (ref 98–111)
Creatinine, Ser: 0.72 mg/dL (ref 0.44–1.00)
GFR, Estimated: >60 mL/min (ref >60)
Glucose, Bld: 121 mg/dL — ABNORMAL HIGH (ref 70–99)
Potassium: 3.2 mmol/L — ABNORMAL LOW (ref 3.5–5.1)
Sodium: 140 mmol/L (ref 135–145)
Total Bilirubin: 0.4 mg/dL (ref 0.3–1.2)
Total Protein: 7.1 g/dL (ref 6.5–8.1)

## 2021-11-21 LAB — URINALYSIS, ROUTINE W REFLEX MICROSCOPIC
Bilirubin Urine: NEGATIVE
Glucose, UA: NEGATIVE mg/dL
Hgb urine dipstick: NEGATIVE
Ketones, ur: NEGATIVE mg/dL
Leukocytes,Ua: NEGATIVE
Nitrite: NEGATIVE
Protein, ur: NEGATIVE mg/dL
Specific Gravity, Urine: >=1.03 (ref 1.005–1.030)
pH: 5.5 (ref 5.0–8.0)

## 2021-11-21 LAB — TROPONIN I (HIGH SENSITIVITY): Troponin I (High Sensitivity): 2 ng/L (ref <18)

## 2021-11-21 LAB — BRAIN NATRIURETIC PEPTIDE: B Natriuretic Peptide: 12.6 pg/mL (ref 0.0–100.0)

## 2021-11-21 MED ORDER — FUROSEMIDE 10 MG/ML IJ SOLN
40.0000 mg | Freq: Once | INTRAMUSCULAR | Status: AC
  Administered 2021-11-21: 40 mg via INTRAVENOUS
  Filled 2021-11-21: qty 4

## 2021-11-21 MED ORDER — FUROSEMIDE 20 MG PO TABS: 20.0000 mg | ORAL_TABLET | Freq: Every day | ORAL | 0 refills | Status: DC

## 2021-11-21 MED ORDER — POTASSIUM CHLORIDE CRYS ER 20 MEQ PO TBCR
40.0000 meq | EXTENDED_RELEASE_TABLET | Freq: Once | ORAL | Status: AC
  Administered 2021-11-21: 40 meq via ORAL
  Filled 2021-11-21: qty 2

## 2021-11-21 NOTE — ED Triage Notes (Signed)
Pt reports BLE and bil hand swelling and SHOB since Sat; saw PCP today and was referred here for further workup

## 2021-11-21 NOTE — Discharge Instructions (Signed)
Take Lasix 20 mg daily for 3 days  Please decrease your salt intake  See your doctor next week to recheck your kidney function  Return to ER if you have worse shortness of breath, leg swelling

## 2021-11-21 NOTE — ED Provider Notes (Signed)
[facility] Provider Note   CSN: [id number] Arrival date & time: 11/21/21  1606     History  Chief Complaint  Patient presents with   Shortness of Breath   Leg Swelling    Lauren Soto is a 51 y.o. female history of hypertension, here presenting with shortness of breath and leg swelling.  Patient states that for the last months she has been having shortness of breath.  She states that it is worse when she walks around.  She also has bilateral leg swelling for the last week or so.  Patient went to see her primary care doctor and was noted to have abnormal EKG and was sent here for further evaluation.  Patient denies any chest pain.  The history is provided by the patient.      Home Medications Prior to Admission medications   Medication Sig Start Date End Date Taking? Authorizing Provider  cyclobenzaprine (FLEXERIL) 10 MG tablet Take 1 tablet (10 mg total) by mouth 2 (two) times daily as needed for muscle spasms. 10/01/20   Corena Herter, PA-C  ferrous fumarate (HEMOCYTE - 106 MG FE) 325 (106 FE) MG TABS Take 1 tablet by mouth.    [provider]  lisinopril (PRINIVIL,ZESTRIL) 20 MG tablet Take 20 mg by mouth daily.    [provider]  methocarbamol (ROBAXIN) 500 MG tablet Take 1 tablet (500 mg total) by mouth 2 (two) times daily. 12/11/17   Volanda Napoleon, PA-C  metoprolol succinate (TOPROL-XL) 50 MG 24 hr tablet Take 50 mg by mouth daily. Take with or immediately following a meal.    [provider]      Allergies    Patient has no known allergies.    Review of Systems   Review of Systems  Respiratory:  Positive for shortness of breath.   All other systems reviewed and are negative.  Physical Exam Updated Vital Signs BP 122/83   Pulse 94   Temp 98 F (36.7 C) (Oral)   Resp 18   Ht 5' (1.524 m)   Wt 94.3 kg   LMP 06/04/2012   SpO2 99%   BMI 40.62 kg/m  Physical Exam Vitals and nursing note reviewed.   Constitutional:      Comments: Slightly uncomfortable and tachypneic   HENT:     Head: Normocephalic.  Eyes:     Extraocular Movements: Extraocular movements intact.     Pupils: Pupils are equal, round, and reactive to light.  Cardiovascular:     Rate and Rhythm: Normal rate and regular rhythm.  Pulmonary:     Comments: Tachypneic, diminished bilateral bases Abdominal:     General: Bowel sounds are normal.     Palpations: Abdomen is soft.  Musculoskeletal:     Cervical back: Normal range of motion and neck supple.     Comments: 2+ edema bilateral legs.  No obvious calf tenderness  Skin:    General: Skin is warm.     Capillary Refill: Capillary refill takes less than 2 seconds.  Neurological:     General: No focal deficit present.     Mental Status: She is alert and oriented to person, place, and time.  Psychiatric:        Mood and Affect: Mood normal.        Behavior: Behavior normal.    ED Results / Procedures / Treatments   Labs (all labs ordered are listed, but only abnormal results are displayed) Labs Reviewed  CBC WITH DIFFERENTIAL/PLATELET - Abnormal; Notable for the following components:      Result Value   Hemoglobin 11.6 (*)    HCT 34.5 (*)    RDW 15.9 (*)    All other components within normal limits  COMPREHENSIVE METABOLIC PANEL - Abnormal; Notable for the following components:   Potassium 3.2 (*)    Glucose, Bld 121 (*)    All other components within normal limits  BRAIN NATRIURETIC PEPTIDE  URINALYSIS, ROUTINE W REFLEX MICROSCOPIC  TROPONIN I (HIGH SENSITIVITY)  TROPONIN I (HIGH SENSITIVITY)    EKG EKG Interpretation  Date/Time:  Wednesday Nov 21 2021 16:20:24 EDT Ventricular Rate:  96 PR Interval:  130 QRS Duration: 74 QT Interval:  418 QTC Calculation: 528 R Axis:   85 Text Interpretation: Normal sinus rhythm Nonspecific T wave abnormality Prolonged QT Abnormal ECG When compared with ECG of 11-Dec-2017 14:31, PREVIOUS ECG IS PRESENT prolonged  QT new since previous Confirmed by Wandra Arthurs [phone]) on 11/21/2021 4:32:38 PM  Radiology DG Chest Port 1 View  Result Date: 11/21/2021 CLINICAL DATA:  Shortness of breath EXAM: PORTABLE CHEST 1 VIEW COMPARISON:  12/11/2017 FINDINGS: The heart size and mediastinal contours are within normal limits. Both lungs are clear. The visualized skeletal structures are unremarkable. IMPRESSION: No active disease. Electronically Signed   By: Donavan Foil M.D.   On: 11/21/2021 17:19    Procedures Procedures    Medications Ordered in ED Medications  potassium chloride SA (KLOR-CON M) CR tablet 40 mEq (has no administration in time range)  furosemide (LASIX) injection 40 mg (40 mg Intravenous Given 11/21/21 1742)    ED Course/ Medical Decision Making/ A&P                           Medical Decision Making MORRISON MASSER is a 51 y.o. female here presenting with shortness of breath and leg swelling.  Concern for possible new onset CHF versus renal failure.  Patient does have prolonged QT that is new since previous.  Concern for possible hyperkalemia or electrolyte abnormality.  Plan to get CBC, CMP and troponin and BNP and chest x-ray and urinalysis.   6:10 PM I reviewed patient's labs and independently interpreted her chest x-ray.  Her labs showed creatinine of 1.7 and potassium 3.2.  Her heart function and BNP is normal.  Chest x-ray is clear.  I do not know why she has edema bilateral legs.  We will give short course of diuretics and told her to decrease salt intake.  We will have her follow-up with PCP next week to  Problems Addressed: Leg swelling: acute illness or injury Shortness of breath: acute illness or injury  Amount and/or Complexity of Data Reviewed Labs: ordered. Decision-making details documented in ED Course. Radiology: ordered and independent interpretation performed. Decision-making details documented in ED Course. ECG/medicine tests: ordered.  Risk Prescription drug  management.   Final Clinical Impression(s) / ED Diagnoses Final diagnoses:  None    Rx / DC Orders ED Discharge Orders     None         Drenda Freeze, MD 11/21/21 [phone]

## 2022-01-29 ENCOUNTER — Emergency Department (HOSPITAL_BASED_OUTPATIENT_CLINIC_OR_DEPARTMENT_OTHER)
Admission: EM | Admit: 2022-01-29 | Discharge: 2022-01-29 | Disposition: A | Discharge status: Home / Self Care | Payer: Self-pay | Attending: Emergency Medicine | Admitting: Emergency Medicine

## 2022-01-29 ENCOUNTER — Other Ambulatory Visit: Payer: Self-pay

## 2022-01-29 ENCOUNTER — Emergency Department (HOSPITAL_BASED_OUTPATIENT_CLINIC_OR_DEPARTMENT_OTHER): Payer: Self-pay

## 2022-01-29 DIAGNOSIS — Z79899 Other long term (current) drug therapy: Secondary | ICD-10-CM | POA: Insufficient documentation

## 2022-01-29 DIAGNOSIS — R000 Tachycardia, unspecified: Secondary | ICD-10-CM | POA: Insufficient documentation

## 2022-01-29 DIAGNOSIS — E119 Type 2 diabetes mellitus without complications: Secondary | ICD-10-CM | POA: Insufficient documentation

## 2022-01-29 DIAGNOSIS — R079 Chest pain, unspecified: Secondary | ICD-10-CM

## 2022-01-29 DIAGNOSIS — J45909 Unspecified asthma, uncomplicated: Secondary | ICD-10-CM | POA: Insufficient documentation

## 2022-01-29 DIAGNOSIS — I10 Essential (primary) hypertension: Secondary | ICD-10-CM | POA: Insufficient documentation

## 2022-01-29 DIAGNOSIS — R0602 Shortness of breath: Secondary | ICD-10-CM | POA: Insufficient documentation

## 2022-01-29 DIAGNOSIS — R42 Dizziness and giddiness: Secondary | ICD-10-CM

## 2022-01-29 LAB — CBC
HCT: 38.5 % (ref 36.0–46.0)
Hemoglobin: 12.7 g/dL (ref 12.0–15.0)
MCH: 28.7 pg (ref 26.0–34.0)
MCHC: 33 g/dL (ref 30.0–36.0)
MCV: 86.9 fL (ref 80.0–100.0)
Platelets: 345 10*3/uL (ref 150–400)
RBC: 4.43 MIL/uL (ref 3.87–5.11)
RDW: 14.3 % (ref 11.5–15.5)
WBC: 11.7 10*3/uL — ABNORMAL HIGH (ref 4.0–10.5)
nRBC: 0 % (ref 0.0–0.2)

## 2022-01-29 LAB — CBC WITH DIFFERENTIAL/PLATELET
Abs Immature Granulocytes: 0.03 10*3/uL (ref 0.00–0.07)
Basophils Absolute: 0 10*3/uL (ref 0.0–0.1)
Basophils Relative: 0 %
Eosinophils Absolute: 0.1 10*3/uL (ref 0.0–0.5)
Eosinophils Relative: 1 %
HCT: 38.1 % (ref 36.0–46.0)
Hemoglobin: 12.7 g/dL (ref 12.0–15.0)
Immature Granulocytes: 0 %
Lymphocytes Relative: 33 %
Lymphs Abs: 3.8 10*3/uL (ref 0.7–4.0)
MCH: 29 pg (ref 26.0–34.0)
MCHC: 33.3 g/dL (ref 30.0–36.0)
MCV: 87 fL (ref 80.0–100.0)
Monocytes Absolute: 0.9 10*3/uL (ref 0.1–1.0)
Monocytes Relative: 8 %
Neutro Abs: 6.6 10*3/uL (ref 1.7–7.7)
Neutrophils Relative %: 58 %
Platelets: 356 10*3/uL (ref 150–400)
RBC: 4.38 MIL/uL (ref 3.87–5.11)
RDW: 14.4 % (ref 11.5–15.5)
WBC: 11.6 10*3/uL — ABNORMAL HIGH (ref 4.0–10.5)
nRBC: 0 % (ref 0.0–0.2)

## 2022-01-29 LAB — BASIC METABOLIC PANEL
Anion gap: 11 (ref 5–15)
BUN: 16 mg/dL (ref 6–20)
CO2: 23 mmol/L (ref 22–32)
Calcium: 9.4 mg/dL (ref 8.9–10.3)
Chloride: 102 mmol/L (ref 98–111)
Creatinine, Ser: 1.01 mg/dL — ABNORMAL HIGH (ref 0.44–1.00)
GFR, Estimated: >60 mL/min (ref >60)
Glucose, Bld: 121 mg/dL — ABNORMAL HIGH (ref 70–99)
Potassium: 3.6 mmol/L (ref 3.5–5.1)
Sodium: 136 mmol/L (ref 135–145)

## 2022-01-29 LAB — BRAIN NATRIURETIC PEPTIDE: B Natriuretic Peptide: 14.8 pg/mL (ref 0.0–100.0)

## 2022-01-29 LAB — TROPONIN I (HIGH SENSITIVITY)
Troponin I (High Sensitivity): 6 ng/L (ref <18)
Troponin I (High Sensitivity): 5 ng/L (ref <18)

## 2022-01-29 LAB — D-DIMER, QUANTITATIVE: D-Dimer, Quant: <0.27 ug{FEU}/mL (ref 0.00–0.50)

## 2022-01-29 MED ORDER — MECLIZINE HCL 25 MG PO TABS: 25.0000 mg | ORAL_TABLET | Freq: Three times a day (TID) | ORAL | 0 refills | Status: DC | PRN

## 2022-01-29 MED ORDER — MECLIZINE HCL 25 MG PO TABS
25.0000 mg | ORAL_TABLET | Freq: Once | ORAL | Status: AC
  Administered 2022-01-29: 25 mg via ORAL
  Filled 2022-01-29: qty 1

## 2022-01-29 NOTE — Discharge Instructions (Addendum)
Your work-up today overall was negative for evidence of a heart attack A referral for cardiology has been placed on your behalf.  I have also provided the contact information, so if they have not reached out to you within the next 72 hours you may call to schedule an appointment.  Please follow-up within the next week or so.  Please also follow-up with your PCP within the next 2 to 3 days for reevaluation continue medical management.  If you do not have one, resources have been provided for you below.  A prescription for a dizziness medication by the name of meclizine has been sent to your pharmacy.  You may take 1 tablet every 8 hours as needed for dizziness.  Return to the ED for new or worsening symptoms as discussed.

## 2022-01-29 NOTE — ED Provider Notes (Signed)
[facility] EMERGENCY DEPARTMENT Provider Note   CSN: [id number] Arrival date & time: 01/29/22  1404     History {Add pertinent medical, surgical, social history, OB history to HPI:1} Chief Complaint  Patient presents with  . Dizziness  . Chest Pain  . Shortness of Breath    Lauren Soto is a 51 y.o. female with history of HTN and DMT2 presenting today with chest pain that begun last night.  Preceded by shortness of breath, mild dizziness, and elevated blood pressure over last 7 days.  Had recent change in BP meds, but has not yet started it.    Dizziness and shortness of breath began unprovoked 1 week ago.  Patient with history of BPPV, although states this feels very different.  Usually with her BPPV, feels like the room is spinning.  These past few days has felt like Lauren Soto/her head has been spinning.  Also with new accompanying symptoms of intermittent blurry vision and presyncopal sensation.  No vision changes at this time.  Denies active swelling or unilateral physical changes of her lower extremities, however endorses intermittent pain of her left calf for at least a week.  Not on anticoagulation.  No Hx of DVT, MI, PE, or CVA.  Patient also with history of asthma, though does not believe Lauren Soto has had any wheezing.  No recent upper respiratory infection, sore throat, congestion, fevers, stiff neck, or cough.  Shortness of breath worsened when Lauren Soto exerts herself, although also occurs when is sitting at rest.  Yesterday, began experiencing centralized chest pain, described as sharp and nonradiating.  This is overall somewhat constant, with no known aggravators.  Does experience increased shortness of breath with the chest pain, again worsened by exertion.  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  Endorses feeling overall "fatigued and tired".  Denies focal deficits, difficulty walking, disequilibrium, difficulty finding words,  or facial asymmetry.    The history is provided by the patient and medical records.  Dizziness Associated symptoms: chest pain and shortness of breath   Chest Pain Associated symptoms: dizziness and shortness of breath   Shortness of Breath Associated symptoms: chest pain        Home Medications Prior to Admission medications   Medication Sig Start Date End Date Taking? Authorizing Provider  meclizine (ANTIVERT) 25 MG tablet Take 1 tablet (25 mg total) by mouth 3 (three) times daily as needed for dizziness. 01/29/22  Yes Prince Rome, PA-C  cyclobenzaprine (FLEXERIL) 10 MG tablet Take 1 tablet (10 mg total) by mouth 2 (two) times daily as needed for muscle spasms. 10/01/20   Corena Herter, PA-C  ferrous fumarate (HEMOCYTE - 106 MG FE) 325 (106 FE) MG TABS Take 1 tablet by mouth.    [provider]  furosemide (LASIX) 20 MG tablet Take 1 tablet (20 mg total) by mouth daily. 11/21/21   Drenda Freeze, MD  lisinopril (PRINIVIL,ZESTRIL) 20 MG tablet Take 20 mg by mouth daily.    [provider]  methocarbamol (ROBAXIN) 500 MG tablet Take 1 tablet (500 mg total) by mouth 2 (two) times daily. 12/11/17   Volanda Napoleon, PA-C  metoprolol succinate (TOPROL-XL) 50 MG 24 hr tablet Take 50 mg by mouth daily. Take with or immediately following a meal.    [provider]      Allergies    Patient has no known allergies.    Review of Systems   Review  of Systems  Respiratory:  Positive for shortness of breath.   Cardiovascular:  Positive for chest pain.  Neurological:  Positive for dizziness.    Physical Exam Updated Vital Signs BP 115/84   Pulse (!) 103   Temp 98.7 F (37.1 C) (Oral)   Resp 16   Wt 93.4 kg   LMP 06/04/2012   SpO2 98%   BMI 40.23 kg/m  Physical Exam Vitals and nursing note reviewed.  Constitutional:      General: Lauren Soto is not in acute distress.    Appearance: Lauren Soto is well-developed. Lauren Soto is not ill-appearing, toxic-appearing or  diaphoretic.  HENT:     Head: Normocephalic and atraumatic.  Eyes:     General: Lids are normal. Vision grossly intact. Gaze aligned appropriately.     Extraocular Movements: Extraocular movements intact.     Conjunctiva/sclera: Conjunctivae normal.     Right eye: Right conjunctiva is not injected.     Left eye: Left conjunctiva is not injected.     Pupils: Pupils are equal, round, and reactive to light.  Neck:     Comments: Very supple on exam.  No meningismus. Cardiovascular:     Rate and Rhythm: Regular rhythm. Tachycardia present.     Pulses:          Radial pulses are 2+ on the right side and 2+ on the left side.       Dorsalis pedis pulses are 2+ on the right side and 2+ on the left side.       Posterior tibial pulses are 2+ on the right side and 2+ on the left side.     Heart sounds: Normal heart sounds. No murmur heard. Pulmonary:     Effort: Pulmonary effort is normal. No tachypnea, accessory muscle usage or respiratory distress.     Breath sounds: Normal breath sounds. No stridor. No decreased breath sounds, wheezing, rhonchi or rales.  Chest:     Chest wall: No deformity, swelling, tenderness or crepitus.  Abdominal:     Palpations: Abdomen is soft.     Tenderness: There is no abdominal tenderness.  Musculoskeletal:        General: No swelling.     Cervical back: Neck supple.     Right lower leg: No edema.     Left lower leg: No edema.     Comments: Without swelling, erythema, warmth, or edema to lower extremities.  However mild localized tenderness of the left posterior calf.  No evidence of ecchymosis or bruising.  Skin:    General: Skin is warm and dry.     Capillary Refill: Capillary refill takes less than 2 seconds.     Coloration: Skin is not cyanotic or pale.     Findings: No erythema.  Neurological:     Mental Status: Lauren Soto is alert and oriented to person, place, and time.     GCS: GCS eye subscore is 4. GCS verbal subscore is 5. GCS motor subscore is 6.      Comments: No facial asymmetry, seizure activity, tremor, truncal weakness or deviation, abnormal EOMs, abnormal finger-nose-finger or heel-to-shin, dysarthria, or extremity weakness.  PERRLA.  No tongue deviation.  Gag reflex present.  Cranial nerve XI intact.  Psychiatric:        Mood and Affect: Mood normal.    ED Results / Procedures / Treatments   Labs (all labs ordered are listed, but only abnormal results are displayed) Labs Reviewed  BASIC METABOLIC PANEL - Abnormal; Notable for the  following components:      Result Value   Glucose, Bld 121 (*)    Creatinine, Ser 1.01 (*)    All other components within normal limits  CBC - Abnormal; Notable for the following components:   WBC 11.7 (*)    All other components within normal limits  CBC WITH DIFFERENTIAL/PLATELET - Abnormal; Notable for the following components:   WBC 11.6 (*)    All other components within normal limits  BRAIN NATRIURETIC PEPTIDE  D-DIMER, QUANTITATIVE  TROPONIN I (HIGH SENSITIVITY)  TROPONIN I (HIGH SENSITIVITY)    EKG EKG Interpretation  Date/Time:  Tuesday January 29 2022 14:17:17 EDT Ventricular Rate:  117 PR Interval:  131 QRS Duration: 85 QT Interval:  345 QTC Calculation: 482 R Axis:   72 Text Interpretation: Sinus tachycardia Right atrial enlargement Low voltage, precordial leads No significant change was found Confirmed by Ezequiel Essex [id number]) on 01/29/2022 2:31:06 PM  Radiology DG Chest 2 View  Result Date: 01/29/2022 CLINICAL DATA:  Chest pain, dizziness, shortness of breath and elevated blood pressure for 1 week EXAM: CHEST - 2 VIEW COMPARISON:  11/21/2021 FINDINGS: Normal heart size, mediastinal contours, and pulmonary vascularity. Lungs clear. No pleural effusion or pneumothorax. Bones unremarkable. IMPRESSION: Normal exam. Electronically Signed   By: Lavonia Dana M.D.   On: 01/29/2022 14:47    Procedures Procedures  {Document cardiac monitor, telemetry assessment procedure when  appropriate:1}  Medications Ordered in ED Medications  meclizine (ANTIVERT) tablet 25 mg (25 mg Oral Given 01/29/22 1800)    ED Course/ Medical Decision Making/ A&P                           Medical Decision Making Amount and/or Complexity of Data Reviewed Labs: ordered. Radiology: ordered.   51 y.o. female presents to the ED for concern of Dizziness, Chest Pain, and Shortness of Breath     This involves an extensive number of treatment options, and is a complaint that carries with it a high risk of complications and morbidity.  The emergent differential diagnosis prior to evaluation includes, but is not limited to: ACS, pneumonia, pneumothorax, pulmonary embolism,pericarditis/myocarditis, GERD, PUD, musculoskeletal  This is not an exhaustive differential.   Past Medical History / Co-morbidities / Social History: Hx of HTN, DMT2 Social Determinants of Health include: None  Additional History:  Internal and external records from outside source obtained and reviewed including recent ED visit --presented with shortness of breath and lower extremity swelling.  Negative work-up.  See note for details.  Lab Tests: I ordered, and personally interpreted labs.  The pertinent results include:   CBC: WBC 11.7 no leukocytosis CMP/BMP: Glucose 121, Creatinine 1.01 BNP: 14.8 Troponin: initial 6, sequential 5 D-Dimer: < 0.27   Imaging Studies: I ordered imaging studies including CXR .   I independently visualized and interpreted imaging which showed no evidence of pulmonary congestion, widened mediastinum, or cardiomegaly.  Negative for pneumonia or pneumothorax as well. I agree with the radiologist interpretation.  Cardiac Monitoring: The patient was maintained on a cardiac monitor.  I personally viewed and interpreted the cardiac monitored which showed an underlying rhythm of: sinus tachycardia I personally ordered and interpreted EKG 12 lead findings, which showed: sinus  tachycardia  ED Course / Critical Interventions: Pt well-appearing on exam.  Nontoxic, nonseptic appearing in NAD.  Presenting with 1 to 2 days of chest pain with 1 week of preceding dizziness, nausea, and shortness of breath.  ABCs  intact.  Patient sitting comfortably.  No active nausea.  Satting at 95-98% on room air.  Mildly tachycardic range between 100 to 118 bpm on average.  Does not appear to be in respiratory distress, lungs CTAB, able to communicate without difficulty for long periods.  HEART score of 3 due to history, age, BMI, and risk factors -- mild-moderate risk.  However troponins negative, EKG unremarkable, BMP unremarkable, and chest x-ray negative for acute cardiopulmonary process.  Aspirin and nitroglycerin withheld at this time.  Well's score 0, low probability.  D-dimer negative, therefore low suspicion for pulmonary embolism or DVT.  Labs unremarkable.  Unlikely pneumonia, no cough, no leukocytosis, no fevers, CXR and exam without acute findings.  Unlikely pneumothorax, no findings on CXR.  Unlikely pericarditis/myocarditis, GERD, PUD, as does not fit clinical picture.  Chest pain at times is exertional and at times occurs with rest; appears to occur at random.  No evidence of pleural effusion or pulmonary edema on CXR.  Unlikely dissection, no pulse deficit, no tearing chest pain, no neurologic complaints.  Due to labs, history, and presentation lower suspicion of ACS at this time.   Neuro exam overall unremarkable.  Without significant headache or active visual changes.  Low suspicion for CVA or ICH.  Pulses strong and equal of all extremities.  No appreciated unilateral or upper versus lower extremity weakness.  PERRLA.  Without facial asymmetry, aphasia, or abnormal EOMs.  Again patient with history of BPPV, although has not have an episode in quite some time.  Plan to provide 1 dose of Antivert and reassess.  If dizziness has improved, plan to walk the patient and observe O2  sats. Upon reevaluation, ***.   I have reviewed the patients home medicines and have made adjustments as needed.  Disposition: Considered admission and after reviewing the patient's encounter today, I feel that the patient would benefit from close outpatient cardiology follow-up.  Also advised short course of Antivert with close PCP follow-up.  Discussed course of treatment with the patient, whom demonstrated understanding.  Patient in agreement and has no further questions.    I discussed this case with my attending, Dr. Rogene Houston, who agreed with the proposed treatment course and cosigned this note including patient's presenting symptoms, physical exam, and planned diagnostics and interventions.  Attending physician stated agreement with plan or made changes to plan which were implemented.     This chart was dictated using voice recognition software.  Despite best efforts to proofread, errors can occur which can change the documentation meaning.   {Document critical care time when appropriate:1} {Document review of labs and clinical decision tools ie heart score, Chads2Vasc2 etc:1}  {Document your independent review of radiology images, and any outside records:1} {Document your discussion with family members, caretakers, and with consultants:1} {Document social determinants of health affecting pt's care:1} {Document your decision making why or why not admission, treatments were needed:1} Final Clinical Impression(s) / ED Diagnoses Final diagnoses:  Hypertension, unspecified type  Nonspecific chest pain  Dizziness  Shortness of breath    Rx / DC Orders ED Discharge Orders          Ordered    Ambulatory referral to Cardiology       Comments: If you have not heard from the Cardiology office within the next 72 hours please call [phone].   01/29/22 1744    meclizine (ANTIVERT) 25 MG tablet  3 times daily PRN        01/29/22 1747

## 2022-01-29 NOTE — ED Provider Triage Note (Signed)
Emergency Medicine Provider Triage Evaluation Note  Belita Warsame Steed-Matthews , a 51 y.o. female  was evaluated in triage.  Pt complains of shortness of breath, central chest pain, dizziness, lightheadedness, headache since yesterday.  Has not had her metoprolol for over a year due to prescription problem but she is taking lisinopril hydrochlorothiazide.  Complains of central chest pain, difficulty breathing intermittent headache and blurred vision..  Review of Systems  Positive: Chest pain, headache, shortness of breath, palpitations Negative: Unilateral weakness, numbness or tingling.,  Cough, fever  Physical Exam  BP 107/75 (BP Location: Right Arm)   Pulse (!) 119   Temp 98.7 F (37.1 C) (Oral)   Resp (!) 22 Comment: shallow  Wt 93.4 kg   LMP 06/04/2012   SpO2 95%   BMI 40.23 kg/m  Gen:   Awake, no distress   Resp:  Normal effort  MSK:   Moves extremities without difficulty  Other:  Tachycardic 120s  Medical Decision Making  Medically screening exam initiated at 2:41 PM.  Appropriate orders placed.  Graceanne Guin Steed-Matthews was informed that the remainder of the evaluation will be completed by another provider, this initial triage assessment does not replace that evaluation, and the importance of remaining in the ED until their evaluation is complete.  Chest pain, shortness of breath, blurry vision, headache, no gross neurological deficits.  EKG without acute ST elevation.   Ezequiel Essex, MD 01/29/22 [phone]

## 2022-01-29 NOTE — ED Notes (Signed)
Patient complaining of weakness, blurred vision, shortness of breath as well as chest discomfort.

## 2022-01-29 NOTE — ED Notes (Signed)
Pt ambulated 2 laps around unit with a steady gait and sats between 94%-96%, states feeling "a little SOB" at moments.

## 2022-01-29 NOTE — ED Triage Notes (Signed)
Pt POV c/o chest pain (started yesterday), ShOB, mild dizziness, and elevated BP's since 01/22/22.  Pt reports recent change in BP meds, has yet to start new regimen.

## 2022-05-03 LAB — HM MAMMOGRAPHY

## 2022-09-13 LAB — HM COLONOSCOPY

## 2022-11-06 ENCOUNTER — Encounter: Payer: Self-pay | Admitting: Family Medicine

## 2022-11-06 ENCOUNTER — Ambulatory Visit (INDEPENDENT_AMBULATORY_CARE_PROVIDER_SITE_OTHER): Payer: Medicaid Other | Admitting: Family Medicine

## 2022-11-06 ENCOUNTER — Ambulatory Visit (HOSPITAL_BASED_OUTPATIENT_CLINIC_OR_DEPARTMENT_OTHER)
Admission: RE | Admit: 2022-11-06 | Discharge: 2022-11-06 | Disposition: A | Discharge status: Home / Self Care | Payer: Medicaid Other | Source: Ambulatory Visit | Attending: Family Medicine | Admitting: Family Medicine

## 2022-11-06 VITALS — BP 118/83 | HR 93 | Ht 60.0 in | Wt 207.0 lb

## 2022-11-06 DIAGNOSIS — M25551 Pain in right hip: Secondary | ICD-10-CM | POA: Diagnosis not present

## 2022-11-06 DIAGNOSIS — Z1159 Encounter for screening for other viral diseases: Secondary | ICD-10-CM

## 2022-11-06 DIAGNOSIS — E785 Hyperlipidemia, unspecified: Secondary | ICD-10-CM

## 2022-11-06 DIAGNOSIS — Z114 Encounter for screening for human immunodeficiency virus [HIV]: Secondary | ICD-10-CM | POA: Diagnosis not present

## 2022-11-06 DIAGNOSIS — G8929 Other chronic pain: Secondary | ICD-10-CM

## 2022-11-06 DIAGNOSIS — E119 Type 2 diabetes mellitus without complications: Secondary | ICD-10-CM

## 2022-11-06 DIAGNOSIS — I10 Essential (primary) hypertension: Secondary | ICD-10-CM | POA: Diagnosis not present

## 2022-11-06 DIAGNOSIS — Z7984 Long term (current) use of oral hypoglycemic drugs: Secondary | ICD-10-CM

## 2022-11-06 DIAGNOSIS — R748 Abnormal levels of other serum enzymes: Secondary | ICD-10-CM

## 2022-11-06 HISTORY — DX: Long term (current) use of oral hypoglycemic drugs: Z79.84

## 2022-11-06 HISTORY — DX: Other chronic pain: G89.29

## 2022-11-06 HISTORY — DX: Hyperlipidemia, unspecified: E78.5

## 2022-11-06 NOTE — Assessment & Plan Note (Signed)
Blood pressure is at goal for age and co-morbidities.   Recommendations: continue hctz 25 mg daily, lisinopril 40 mg daily - BP goal <130/80 - monitor and log blood pressures at home - check around the same time each day in a relaxed setting - Limit salt to <2000 mg/day - Follow DASH eating plan (heart healthy diet) - limit alcohol to 2 standard drinks per day for men and 1 per day for women - avoid tobacco products - get at least 2 hours of regular aerobic exercise weekly Patient aware of signs/symptoms requiring further/urgent evaluation. Labs updated today.

## 2022-11-06 NOTE — Assessment & Plan Note (Signed)
Getting some relief with meloxicam Referral for new ortho, requesting [facility] Starting with xrays today

## 2022-11-06 NOTE — Assessment & Plan Note (Signed)
Medication management: Lipitor 20 mg daily Lifestyle factors for lowering cholesterol include: Diet therapy - heart-healthy diet rich in fruits, veggies, fiber-rich whole grains, lean meats, chicken, fish (at least twice a week), fat-free or 1% dairy products; foods low in saturated/trans fats, cholesterol, sodium, and sugar. Mediterranean diet has shown to be very heart healthy. Regular exercise - recommend at least 30 minutes a day, 5 times per week Weight management  Repeat CMP and lipid panel today

## 2022-11-06 NOTE — Assessment & Plan Note (Signed)
Unsure of last A1c. Recheck today  Continue current medications: metformin 850 mg BID UTD on vaccines, eye exam, foot exam On ACEi  On Statin  Discussed diet and exercise F/u in 3 months

## 2022-11-06 NOTE — Progress Notes (Signed)
New Patient Office Visit  Subjective    Patient ID: Lauren Soto, female    DOB: [birth year]  Age: 52 y.o. MRN: [id number]  CC:  Chief Complaint  Patient presents with   Establish Care    HPI Lauren Soto presents to establish care. She was previously following at the [facility] in [place]. She was working at [employer] (working with her hands a lot was bothersome), now she does some private sitting jobs.    Hypertension: - Medications: hctz 25 mg daily, lisinopril 40 mg daily - Compliance: good - Checking BP at home: no - Denies any SOB, recurrent headaches, CP, vision changes, LE edema, dizziness, palpitations, or medication side effects.  Diabetes: - Checking glucose at home: no - Medications: metformin 850 mg BID - Compliance: good - Diet: general - Exercise: minimal due to chronic pain - Eye exam: patient to schedule - Foot exam: deferred - Microalbumin: today - Denies symptoms of hypoglycemia, polyuria, polydipsia, numbness extremities, foot ulcers/trauma, wounds that are not healing, medication side effects   Hyperlipidemia: - medications: atorvastatin 20 mg daily - compliance: good - medication SEs: none The ASCVD Risk score (Arnett DK, et al., 2019) failed to calculate for the following reasons:   Cannot find a previous HDL lab   Cannot find a previous total cholesterol lab  GERD: - Manageable with Prilosec  Headaches/vertigo: - She gets occasional headaches and sometimes will have vertigo issues as well.  - Takes occasional meclizine and Zofran.   Pain: - DDD cervical, lumbar - She does well with meloxicam daily. - She was following with Ortho, but couldn't afford until now getting Medicaid. She would like a new referral for [facility].  - Most bothersome right now is the right hip pain that started worsening last fall          Outpatient Encounter Medications as of 11/06/2022  Medication Sig   atorvastatin (LIPITOR) 20  MG tablet Take 20 mg by mouth daily.   cyclobenzaprine (FLEXERIL) 10 MG tablet Take 1 tablet (10 mg total) by mouth 2 (two) times daily as needed for muscle spasms.   diclofenac Sodium (VOLTAREN) 1 % GEL Apply topically 4 (four) times daily.   hydrochlorothiazide (HYDRODIURIL) 25 MG tablet Take 25 mg by mouth daily.   lisinopril (ZESTRIL) 40 MG tablet Take 40 mg by mouth daily.   meclizine (ANTIVERT) 25 MG tablet Take 1 tablet (25 mg total) by mouth 3 (three) times daily as needed for dizziness.   meloxicam (MOBIC) 15 MG tablet Take 15 mg by mouth daily as needed.   metFORMIN (GLUCOPHAGE) 850 MG tablet Take 850 mg by mouth 2 (two) times daily.   omeprazole (PRILOSEC) 20 MG capsule Take 20 mg by mouth every morning.   ondansetron (ZOFRAN) 8 MG tablet Take by mouth every 8 (eight) hours as needed for nausea or vomiting.   [DISCONTINUED] divalproex (DEPAKOTE ER) 500 MG 24 hr tablet Take 500 mg by mouth daily.   [DISCONTINUED] pregabalin (LYRICA) 75 MG capsule Take 1 capsule by mouth 3 (three) times daily.   [DISCONTINUED] ferrous fumarate (HEMOCYTE - 106 MG FE) 325 (106 FE) MG TABS Take 1 tablet by mouth.   [DISCONTINUED] furosemide (LASIX) 20 MG tablet Take 1 tablet (20 mg total) by mouth daily.   [DISCONTINUED] lisinopril (PRINIVIL,ZESTRIL) 20 MG tablet Take 20 mg by mouth daily.   [DISCONTINUED] methocarbamol (ROBAXIN) 500 MG tablet Take 1 tablet (500 mg total) by mouth 2 (two) times daily.   [  DISCONTINUED] metoprolol succinate (TOPROL-XL) 50 MG 24 hr tablet Take 50 mg by mouth daily. Take with or immediately following a meal.   No facility-administered encounter medications on file as of 11/06/2022.    Past Medical History:  Diagnosis Date   Diabetes mellitus without complication (HCC)    Hypertension    Vertigo     Past Surgical History:  Procedure Laterality Date   ABDOMINAL HYSTERECTOMY     BUNIONECTOMY     CHOLECYSTECTOMY     SHOULDER ARTHROSCOPY      Family History  Problem  Relation Age of Onset   Hypertension Mother    Multiple myeloma Mother    Arthritis Mother    Hypertension Father     Social History   Socioeconomic History   Marital status: Married    Spouse name: Not on file   Number of children: Not on file   Years of education: Not on file   Highest education level: Not on file  Occupational History   Not on file  Tobacco Use   Smoking status: Former    Types: Cigarettes    Quit date: 2009    Years since quitting: 15.3   Smokeless tobacco: Never   Tobacco comments:    "Every once in awhile will vape"  Substance and Sexual Activity   Alcohol use: No   Drug use: No   Sexual activity: Yes    Birth control/protection: Surgical  Other Topics Concern   Not on file  Social History Narrative   Not on file   Social Determinants of Health   Financial Resource Strain: Not on file  Food Insecurity: Not on file  Transportation Needs: Not on file  Physical Activity: Not on file  Stress: Not on file  Social Connections: Not on file  Intimate Partner Violence: Not on file    ROS All review of systems negative except what is listed in the HPI      Objective    BP 118/83   Pulse 93   Ht 5' (1.524 m)   Wt 207 lb (93.9 kg)   LMP 06/04/2012   SpO2 96%   BMI 40.43 kg/m   Physical Exam Vitals reviewed.  Constitutional:      Appearance: Normal appearance. She is obese.  Cardiovascular:     Rate and Rhythm: Normal rate and regular rhythm.     Pulses: Normal pulses.     Heart sounds: Normal heart sounds.  Pulmonary:     Effort: Pulmonary effort is normal.     Breath sounds: Normal breath sounds.  Feet:     Comments: Diabetic foot exam was performed.  No deformities or other abnormal visual findings.  Posterior tibialis and dorsalis pulse intact bilaterally.  Intact to touch and monofilament testing bilaterally.   Skin:    General: Skin is warm and dry.  Neurological:     Mental Status: She is alert and oriented to person,  place, and time.  Psychiatric:        Mood and Affect: Mood normal.        Behavior: Behavior normal.        Thought Content: Thought content normal.        Judgment: Judgment normal.         Assessment & Plan:   Problem List Items Addressed This Visit     Hypertension (Chronic)    Blood pressure is at goal for age and co-morbidities.   Recommendations: continue hctz 25 mg daily, lisinopril  40 mg daily - BP goal <130/80 - monitor and log blood pressures at home - check around the same time each day in a relaxed setting - Limit salt to <2000 mg/day - Follow DASH eating plan (heart healthy diet) - limit alcohol to 2 standard drinks per day for men and 1 per day for women - avoid tobacco products - get at least 2 hours of regular aerobic exercise weekly Patient aware of signs/symptoms requiring further/urgent evaluation. Labs updated today.       Relevant Medications   lisinopril (ZESTRIL) 40 MG tablet   hydrochlorothiazide (HYDRODIURIL) 25 MG tablet   atorvastatin (LIPITOR) 20 MG tablet   Other Relevant Orders   CBC with Differential/Platelet   Comprehensive metabolic panel   TSH   Type 2 diabetes mellitus without complication (HCC) (Chronic)    Unsure of last A1c. Recheck today  Continue current medications: metformin 850 mg BID UTD on vaccines, eye exam, foot exam On ACEi  On Statin  Discussed diet and exercise F/u in 3 months       Relevant Medications   metFORMIN (GLUCOPHAGE) 850 MG tablet   lisinopril (ZESTRIL) 40 MG tablet   atorvastatin (LIPITOR) 20 MG tablet   Other Relevant Orders   CBC with Differential/Platelet   Comprehensive metabolic panel   Hemoglobin A1c   Lipid panel   Microalbumin / creatinine urine ratio   Long term current use of oral hypoglycemic drug (Chronic)   Relevant Orders   Hemoglobin A1c   Microalbumin / creatinine urine ratio   Chronic right hip pain (Chronic)    Getting some relief with meloxicam Referral for new ortho,  requesting [facility] Starting with xrays today      Relevant Medications   meloxicam (MOBIC) 15 MG tablet   Other Relevant Orders   DG HIP UNILAT W OR W/O PELVIS 2-3 VIEWS RIGHT   Ambulatory referral to Orthopedic Surgery   Hyperlipidemia (Chronic)    Medication management: Lipitor 20 mg daily Lifestyle factors for lowering cholesterol include: Diet therapy - heart-healthy diet rich in fruits, veggies, fiber-rich whole grains, lean meats, chicken, fish (at least twice a week), fat-free or 1% dairy products; foods low in saturated/trans fats, cholesterol, sodium, and sugar. Mediterranean diet has shown to be very heart healthy. Regular exercise - recommend at least 30 minutes a day, 5 times per week Weight management  Repeat CMP and lipid panel today       Relevant Medications   lisinopril (ZESTRIL) 40 MG tablet   hydrochlorothiazide (HYDRODIURIL) 25 MG tablet   atorvastatin (LIPITOR) 20 MG tablet   Other Relevant Orders   Comprehensive metabolic panel   Lipid panel   Other Visit Diagnoses     Encounter for screening for HIV    -  Primary   Relevant Orders   HIV Antibody (routine testing w rflx)   Encounter for hepatitis C screening test for low risk patient       Relevant Orders   Hepatitis C antibody        Return in about 3 months (around 02/06/2023) for routine follow-up, chronic disease management .   Clayborne Dana, NP

## 2022-11-06 NOTE — Patient Instructions (Signed)
Updating labs Referral to ortho Hip xray today  ---------------  Thank you for choosing [facility] for your Primary Care needs. I am excited for the opportunity to partner with you to meet your health care goals. It was a pleasure meeting you today!  Information on diet, exercise, and health maintenance recommendations are listed below. This is information to help you be sure you are on track for optimal health and monitoring.   Please look over this and let us know if you have any questions or if you have completed any of the health maintenance outside of [facility] so that we can be sure your records are up to date.  ___________________________________________________________  MyChart:  For all urgent or time sensitive needs we ask that you please call the office to avoid delays. Our number is [phone]. MyChart is not constantly monitored and due to the large volume of messages a day, replies may take up to 72 business hours.  MyChart Policy: MyChart allows for you to see your visit notes, after visit summary, provider recommendations, lab and tests results, make an appointment, request refills, and contact your provider or the office for non-urgent questions or concerns. Providers are seeing patients during normal business hours and do not have built in time to review MyChart messages.  We ask that you allow a minimum of 3 business days for responses to [employer]. For this reason, please do not send urgent requests through MyChart. Please call the office at [phone]. New and ongoing conditions may require a visit. We have virtual and in-person visits available for your convenience.  Complex MyChart concerns may require a visit. Your provider may request you schedule a virtual or in-person visit to ensure we are providing the best care possible. MyChart messages sent after 11:00 AM on Friday will not be received by the provider until Monday  morning.    Lab and Test Results: You will receive your lab and test results on MyChart as soon as they are completed and results have been sent by the lab or testing facility. Due to this service, you will receive your results BEFORE your provider.  I review lab and test results each morning prior to seeing patients. Some results require collaboration with other providers to ensure you are receiving the most appropriate care. For this reason, we ask that you please allow a minimum of 3-5 business days from the time that ALL results have been received for your provider to receive and review lab and test results and contact you about these.  Most lab and test result comments from the provider will be sent through MyChart. Your provider may recommend changes to the plan of care, follow-up visits, repeat testing, ask questions, or request an office visit to discuss these results. You may reply directly to this message or call the office to provide information for the provider or set up an appointment. In some instances, you will be called with test results and recommendations. Please let us know if this is preferred and we will make note of this in your chart to provide this for you.    If you have not heard a response to your lab or test results in 5 business days from all results returning to MyChart, please call the office to let us know. We ask that you please avoid calling prior to this time unless there is an emergent concern. Due to high call volumes, this can delay the resulting  process.  After Hours: For all non-emergency after hours needs, please call the office at [phone] and select the option to reach the on-call  service. On-call services are shared between multiple [facility] offices and therefore it will not be possible to speak directly with your provider. On-call providers may provide medical advice and recommendations, but are unable to provide refills for maintenance medications.   For all emergency or urgent medical needs after normal business hours, we recommend that you seek care at the closest Urgent Care or Emergency Department to ensure appropriate treatment in a timely manner.  [facility] has a 24 hour emergency room located on the ground floor for your convenience.   Urgent Concerns During the Business Day Providers are seeing patients from 8AM to 5PM with a busy schedule and are most often not able to respond to non-urgent calls until the end of the day or the next business day. If you should have URGENT concerns during the day, please call and speak to the nurse or schedule a same day appointment so that we can address your concern without delay.   Thank you, again, for choosing me as your health care partner. I appreciate your trust and look forward to learning more about you!   Lollie Marrow Reola Calkins, DNP, FNP-C  ___________________________________________________________  Health Maintenance Recommendations Screening Testing Mammogram Every 1-2 years based on history and risk factors Starting at age 12 Pap Smear Ages 21-39 every 3 years Ages 76-65 every 5 years with HPV testing More frequent testing may be required based on results and history Colon Cancer Screening Every 1-10 years based on test performed, risk factors, and history Starting at age 57 Bone Density Screening Every 2-10 years based on history Starting at age 52 for women Recommendations for men differ based on medication usage, history, and risk factors AAA Screening One time ultrasound Men 95-47 years old who have ever smoked Lung Cancer Screening Low Dose Lung CT every 12 months Age 44-80 years with a 20 pack-year smoking history who still smoke or who have quit within the last 15 years  Screening Labs Routine  Labs: Complete Blood Count (CBC), Complete Metabolic Panel (CMP), Cholesterol (Lipid Panel) Every 6-12 months based on history and medications May be recommended  more frequently based on current conditions or previous results Hemoglobin A1c Lab Every 3-12 months based on history and previous results Starting at age 24 or earlier with diagnosis of diabetes, high cholesterol, BMI >26, and/or risk factors Frequent monitoring for patients with diabetes to ensure blood sugar control Thyroid Panel  Every 6 months based on history, symptoms, and risk factors May be repeated more often if on medication HIV One time testing for all patients 13 and older May be repeated more frequently for patients with increased risk factors or exposure Hepatitis C One time testing for all patients 24 and older May be repeated more frequently for patients with increased risk factors or exposure Gonorrhea, Chlamydia Every 12 months for all sexually active persons 13-24 years Additional monitoring may be recommended for those who are considered high risk or who have symptoms PSA Men 27-54 years old with risk factors Additional screening may be recommended from age 54-69 based on risk factors, symptoms, and history  Vaccine Recommendations Tetanus Booster All adults every 10 years Flu Vaccine All patients 6 months and older every year COVID Vaccine All patients 12 years and older Initial dosing with booster May recommend additional booster based on age and health history HPV  Vaccine 2 doses all patients age 39-26 Dosing may be considered for patients over 26 Shingles Vaccine (Shingrix) 2 doses all adults 50 years and older Pneumonia (Pneumovax 23) All adults 65 years and older May recommend earlier dosing based on health history Pneumonia (Prevnar 90) All adults 65 years and older Dosed 1 year after Pneumovax 23 Pneumonia (Prevnar 20) All adults 65 years and older (adults 19-64 with certain conditions or risk factors) 1 dose  For those who have not received Prevnar 13 vaccine previously   Additional Screening, Testing, and Vaccinations may be recommended on  an individualized basis based on family history, health history, risk factors, and/or exposure.  __________________________________________________________  Diet Recommendations for All Patients  I recommend that all patients maintain a diet low in saturated fats, carbohydrates, and cholesterol. While this can be challenging at first, it is not impossible and small changes can make big differences.  Things to try: Decreasing the amount of soda, sweet tea, and/or juice to one or less per day and replace with water While water is always the first choice, if you do not like water you may consider adding a water additive without sugar to improve the taste other sugar free drinks Replace potatoes with a brightly colored vegetable  Use healthy oils, such as canola oil or olive oil, instead of butter or hard margarine Limit your bread intake to two pieces or less a day Replace regular pasta with low carb pasta options Bake, broil, or grill foods instead of frying Monitor portion sizes  Eat smaller, more frequent meals throughout the day instead of large meals  An important thing to remember is, if you love foods that are not great for your health, you don't have to give them up completely. Instead, allow these foods to be a reward when you have done well. Allowing yourself to still have special treats every once in a while is a nice way to tell yourself thank you for working hard to keep yourself healthy.   Also remember that every day is a new day. If you have a bad day and "fall off the wagon", you can still climb right back up and keep moving along on your journey!  We have resources available to help you!  Some websites that may be helpful include: www.http://www.wall-moore.info/  Www.VeryWellFit.com _____________________________________________________________  Activity Recommendations for All Patients  I recommend that all adults get at least 20 minutes of moderate physical activity that elevates your  heart rate at least 5 days out of the week.  Some examples include: Walking or jogging at a pace that allows you to carry on a conversation Cycling (stationary bike or outdoors) Water aerobics Yoga Weight lifting Dancing If physical limitations prevent you from putting stress on your joints, exercise in a pool or seated in a chair are excellent options.  Do determine your MAXIMUM heart rate for activity: 220 - YOUR AGE = MAX Heart Rate   Remember! Do not push yourself too hard.  Start slowly and build up your pace, speed, weight, time in exercise, etc.  Allow your body to rest between exercise and get good sleep. You will need more water than normal when you are exerting yourself. Do not wait until you are thirsty to drink. Drink with a purpose of getting in at least 8, 8 ounce glasses of water a day plus more depending on how much you exercise and sweat.    If you begin to develop dizziness, chest pain, abdominal pain, jaw pain,  shortness of breath, headache, vision changes, lightheadedness, or other concerning symptoms, stop the activity and allow your body to rest. If your symptoms are severe, seek emergency evaluation immediately. If your symptoms are concerning, but not severe, please let us know so that we can recommend further evaluation.

## 2022-11-07 LAB — CBC WITH DIFFERENTIAL/PLATELET
Basophils Absolute: 0.1 10*3/uL (ref 0.0–0.1)
Basophils Relative: 0.7 % (ref 0.0–3.0)
Eosinophils Absolute: 0.2 10*3/uL (ref 0.0–0.7)
Eosinophils Relative: 1.9 % (ref 0.0–5.0)
HCT: 36 % (ref 36.0–46.0)
Hemoglobin: 12 g/dL (ref 12.0–15.0)
Lymphocytes Relative: 29.3 % (ref 12.0–46.0)
Lymphs Abs: 2.9 10*3/uL (ref 0.7–4.0)
MCHC: 33.2 g/dL (ref 30.0–36.0)
MCV: 86 fl (ref 78.0–100.0)
Monocytes Absolute: 0.7 10*3/uL (ref 0.1–1.0)
Monocytes Relative: 6.7 % (ref 3.0–12.0)
Neutro Abs: 6.1 10*3/uL (ref 1.4–7.7)
Neutrophils Relative %: 61.4 % (ref 43.0–77.0)
Platelets: 404 10*3/uL — ABNORMAL HIGH (ref 150.0–400.0)
RBC: 4.18 Mil/uL (ref 3.87–5.11)
RDW: 14.6 % (ref 11.5–15.5)
WBC: 9.9 10*3/uL (ref 4.0–10.5)

## 2022-11-07 LAB — HEMOGLOBIN A1C: Hgb A1c MFr Bld: 7.5 % — ABNORMAL HIGH (ref 4.6–6.5)

## 2022-11-07 LAB — LIPID PANEL
Cholesterol: 157 mg/dL (ref 0–200)
HDL: 51 mg/dL (ref >39.00)
LDL Cholesterol: 75 mg/dL (ref 0–99)
NonHDL: 105.78
Total CHOL/HDL Ratio: 3
Triglycerides: 154 mg/dL — ABNORMAL HIGH (ref 0.0–149.0)
VLDL: 30.8 mg/dL (ref 0.0–40.0)

## 2022-11-07 LAB — COMPREHENSIVE METABOLIC PANEL
ALT: 15 U/L (ref 0–35)
AST: 15 U/L (ref 0–37)
Albumin: 4 g/dL (ref 3.5–5.2)
Alkaline Phosphatase: 162 U/L — ABNORMAL HIGH (ref 39–117)
BUN: 9 mg/dL (ref 6–23)
CO2: 29 mEq/L (ref 19–32)
Calcium: 9.4 mg/dL (ref 8.4–10.5)
Chloride: 100 mEq/L (ref 96–112)
Creatinine, Ser: 0.73 mg/dL (ref 0.40–1.20)
GFR: 95.35 mL/min (ref >60.00)
Glucose, Bld: 115 mg/dL — ABNORMAL HIGH (ref 70–99)
Potassium: 4.1 mEq/L (ref 3.5–5.1)
Sodium: 139 mEq/L (ref 135–145)
Total Bilirubin: 0.3 mg/dL (ref 0.2–1.2)
Total Protein: 7 g/dL (ref 6.0–8.3)

## 2022-11-07 LAB — MICROALBUMIN / CREATININE URINE RATIO
Creatinine,U: 106.8 mg/dL
Microalb Creat Ratio: 1 mg/g (ref 0.0–30.0)
Microalb, Ur: 1.1 mg/dL (ref 0.0–1.9)

## 2022-11-07 LAB — TSH: TSH: 0.94 u[IU]/mL (ref 0.35–5.50)

## 2022-11-07 LAB — HIV ANTIBODY (ROUTINE TESTING W REFLEX): HIV 1&2 Ab, 4th Generation: NONREACTIVE

## 2022-11-07 LAB — HEPATITIS C ANTIBODY: Hepatitis C Ab: NONREACTIVE

## 2022-11-07 NOTE — Addendum Note (Signed)
Addended by: Hyman Hopes B on: 11/07/2022 04:03 PM   Modules accepted: Orders

## 2022-11-15 ENCOUNTER — Ambulatory Visit: Payer: Medicaid Other | Admitting: Surgical

## 2022-11-18 ENCOUNTER — Other Ambulatory Visit (INDEPENDENT_AMBULATORY_CARE_PROVIDER_SITE_OTHER): Payer: Medicaid Other

## 2022-11-18 ENCOUNTER — Encounter: Payer: Self-pay | Admitting: Surgical

## 2022-11-18 ENCOUNTER — Ambulatory Visit (INDEPENDENT_AMBULATORY_CARE_PROVIDER_SITE_OTHER): Payer: Medicaid Other | Admitting: Surgical

## 2022-11-18 DIAGNOSIS — M25551 Pain in right hip: Secondary | ICD-10-CM

## 2022-11-18 DIAGNOSIS — M48061 Spinal stenosis, lumbar region without neurogenic claudication: Secondary | ICD-10-CM

## 2022-11-22 ENCOUNTER — Encounter: Payer: Self-pay | Admitting: Surgical

## 2022-11-22 NOTE — Progress Notes (Signed)
Office Visit Note   Patient: Lauren Soto           Date of Birth: [birth year]           MRN: [id number] Visit Date: 11/18/2022 Requested by: Clayborne Dana, NP [address] [place],  [place] [address] PCP: Clayborne Dana, NP  Subjective: Chief Complaint  Patient presents with   Right Hip - Pain    HPI: Lauren Soto is a 52 y.o. female who presents to the office reporting right hip pain.  Patient states that she has had pain since about September/October 2023.  She denies any history of injury.  She states that she woke up with increased pain that progressed over the course of September and October to the point where it was at its worst in November 2023.  She has low back pain that radiates into the buttock region as well as into her groin.  It has a burning quality to it.  It will also travel down to the posterior aspect of her knee at times.  She is taking Tylenol for pain control.  She saw an orthopedic provider in another practice who felt that this was more trochanteric pain.  She has not had any injections or any physical therapy.  She has no history of hip or back surgery.  She does have history of diabetes with last A1c 7.5.  The right leg feels like it wants to give out on her.  She has no left-sided symptoms aside from calf tingling and cramping.  She will have cramping in both calves.  She has not had any MRI scan.  At times, due to the buttock pain, she will have to lift her but when she sits.  Leaning forward will help some of her pain especially when she goes to the grocery store goes shopping she will lean against the cart in order to give her some support.  She cannot really lay on her right side due to this pain..                ROS: All systems reviewed are negative as they relate to the chief complaint within the history of present illness.  Patient denies fevers or chills.  Assessment & Plan: Visit Diagnoses:  1. Spinal stenosis of  lumbar region, unspecified whether neurogenic claudication present   2. Pain in right hip     Plan: Patient is a 52 year old female who presents for evaluation of right hip pain.  She has chronic hip pain and low back pain that has been ongoing for about 8 to 9 months at this point.  She has radiographs of her right hip that was ordered by her PCP demonstrating no significant pathology.  She has a lot of features of her pain that are concerning for referred pain from the lumbar spine including weakness on exam, radiation of pain, burning quality.  Additionally, she has cramping of her calves and significant improvement in her pain with leaning against a shopping cart which can be indicative of spinal stenosis.  However, she also has reproduction of pain with hip range of motion that reproduces some groin pain as well as tenderness over the ischial tuberosity with some hamstring weakness that could be more related to hamstring tendinopathy rather than weakness from the lumbar spine.  As such, due to the long duration of symptoms with no improvement, we will proceed with MRI lumbar spine to evaluate for spinal stenosis versus  herniated disc as well as MRI of the pelvis to evaluate for right hip pathology such as hamstring tendinopathy.  Follow-up with the office after MRI scans.  Follow-Up Instructions: No follow-ups on file.   Orders:  Orders Placed This Encounter  Procedures   XR Lumbar Spine 2-3 Views   MR Pelvis w/o contrast   MR Lumbar Spine w/o contrast   No orders of the defined types were placed in this encounter.     Procedures: No procedures performed   Clinical Data: No additional findings.  Objective: Vital Signs: LMP 06/04/2012   Physical Exam:  Constitutional: Patient appears well-developed HEENT:  Head: Normocephalic Eyes:EOM are normal Neck: Normal range of motion Cardiovascular: Normal rate Pulmonary/chest: Effort normal Neurologic: Patient is alert Skin: Skin is  warm Psychiatric: Patient has normal mood and affect  Ortho Exam: Ortho exam demonstrates intact hip flexion, quadricep, dorsiflexion, plantarflexion without any significant weakness or reproduction of pain.  She does have some increased pain with FADIR sign mildly reproducing some groin pain.  She has positive Stinchfield sign moderately that reproduces groin pain reliably.  She also has weakness of the hamstring on the right relative to the left with strength rated 4/5 versus 5/5.  There is tenderness over the proximal hamstring attachment on the ischial tuberosity when patient is in the prone position on the right with no tenderness on the left.  She has positive straight leg raise on right, negative on left.  She has tenderness throughout the lower lumbar spine with some mild tenderness over the right-sided SI joint and no tenderness over the left SI joint.  No trochanteric tenderness on either side on exam today.  She ambulates without Trendelenburg gait.  Specialty Comments:  No specialty comments available.  Imaging: No results found.   PMFS History: Patient Active Problem List   Diagnosis Date Noted   Long term current use of oral hypoglycemic drug 11/06/2022   Chronic right hip pain 11/06/2022   Hyperlipidemia 11/06/2022   Vestibular migraine 07/06/2018   Type 2 diabetes mellitus without complication (HCC) 06/18/2018   Asthma 04/21/2014   Menorrhagia 06/08/2012   Pelvic pain in female 06/08/2012   Hypertension 05/25/2012   Uterine fibroid 05/25/2012   Past Medical History:  Diagnosis Date   Diabetes mellitus without complication (HCC)    Hypertension    Vertigo     Family History  Problem Relation Age of Onset   Hypertension Mother    Multiple myeloma Mother    Arthritis Mother    Hypertension Father     Past Surgical History:  Procedure Laterality Date   ABDOMINAL HYSTERECTOMY     BUNIONECTOMY     CHOLECYSTECTOMY     SHOULDER ARTHROSCOPY     Social History    Occupational History   Not on file  Tobacco Use   Smoking status: Former    Types: Cigarettes    Quit date: 2009    Years since quitting: 15.4   Smokeless tobacco: Never   Tobacco comments:    "Every once in awhile will vape"  Substance and Sexual Activity   Alcohol use: No   Drug use: No   Sexual activity: Yes    Birth control/protection: Surgical

## 2022-11-24 ENCOUNTER — Ambulatory Visit
Admission: RE | Admit: 2022-11-24 | Discharge: 2022-11-24 | Disposition: A | Discharge status: Home / Self Care | Payer: Medicaid Other | Source: Ambulatory Visit | Attending: Surgical | Admitting: Surgical

## 2022-11-24 DIAGNOSIS — M1612 Unilateral primary osteoarthritis, left hip: Secondary | ICD-10-CM | POA: Diagnosis not present

## 2022-11-24 DIAGNOSIS — M25551 Pain in right hip: Secondary | ICD-10-CM

## 2022-11-24 DIAGNOSIS — M4317 Spondylolisthesis, lumbosacral region: Secondary | ICD-10-CM | POA: Diagnosis not present

## 2022-11-24 DIAGNOSIS — M48061 Spinal stenosis, lumbar region without neurogenic claudication: Secondary | ICD-10-CM

## 2022-11-24 DIAGNOSIS — M1611 Unilateral primary osteoarthritis, right hip: Secondary | ICD-10-CM | POA: Diagnosis not present

## 2022-11-29 ENCOUNTER — Other Ambulatory Visit: Payer: Self-pay

## 2022-11-29 ENCOUNTER — Telehealth: Payer: Self-pay | Admitting: Family Medicine

## 2022-11-29 ENCOUNTER — Ambulatory Visit (INDEPENDENT_AMBULATORY_CARE_PROVIDER_SITE_OTHER): Payer: Medicaid Other | Admitting: Surgical

## 2022-11-29 ENCOUNTER — Other Ambulatory Visit (INDEPENDENT_AMBULATORY_CARE_PROVIDER_SITE_OTHER): Payer: Medicaid Other

## 2022-11-29 DIAGNOSIS — M25551 Pain in right hip: Secondary | ICD-10-CM | POA: Diagnosis not present

## 2022-11-29 DIAGNOSIS — R748 Abnormal levels of other serum enzymes: Secondary | ICD-10-CM

## 2022-11-29 LAB — GAMMA GT: GGT: 14 U/L (ref 7–51)

## 2022-11-29 NOTE — Telephone Encounter (Signed)
Prescription Request  11/29/2022  Is this a "Controlled Substance" medicine? No  LOV: 11/06/2022  What is the name of the medication or equipment?  Patient stated she needs all of her medications refilled  Have you contacted your pharmacy to request a refill? No   Which pharmacy would you like this sent to?  [id number] - [place], [place] - [address] [place] [address] Phone: [phone] Fax: [phone]  Patient notified that their request is being sent to the clinical staff for review and that they should receive a response within 2 business days.   Please advise at [facility] [phone]

## 2022-11-30 ENCOUNTER — Encounter: Payer: Self-pay | Admitting: Surgical

## 2022-11-30 LAB — ALKALINE PHOSPHATASE, ISOENZYMES

## 2022-11-30 MED ORDER — LIDOCAINE HCL 1 % IJ SOLN
5.0000 mL | INTRAMUSCULAR | Status: AC | PRN
Start: 2022-11-29 — End: 2022-11-29
  Administered 2022-11-29: 5 mL

## 2022-11-30 MED ORDER — BUPIVACAINE HCL 0.25 % IJ SOLN
4.0000 mL | INTRAMUSCULAR | Status: AC | PRN
Start: 2022-11-29 — End: 2022-11-29
  Administered 2022-11-29: 4 mL via INTRA_ARTICULAR

## 2022-11-30 MED ORDER — METHYLPREDNISOLONE ACETATE 40 MG/ML IJ SUSP
40.0000 mg | INTRAMUSCULAR | Status: AC | PRN
Start: 2022-11-29 — End: 2022-11-29
  Administered 2022-11-29: 40 mg via INTRA_ARTICULAR

## 2022-11-30 NOTE — Progress Notes (Signed)
Office Visit Note   Patient: Lauren Soto           Date of Birth: Jul 12, 1970           MRN: [id number] Visit Date: 11/29/2022 Requested by: Clayborne Dana, NP [address] [place],  [place] [address] PCP: Clayborne Dana, NP  Subjective: Chief Complaint  Patient presents with   Other    Review scans    HPI: Lauren Soto is a 52 y.o. female who presents to the office for MRI review. Patient denies any changes in symptoms.  Continues to complain mainly of low back pain with right buttock pain with lateral hip pain that radiates down to the posterior knee.  No red flag signs or symptoms such as bowel/bladder incontinence or saddle anesthesia.  She has had chronic pain for about 9 months at this point.  MRI results revealed: MR Pelvis w/o contrast  Result Date: 11/29/2022 CLINICAL DATA:  eval right hamstring and gluteal tendinopathy EXAM: MRI PELVIS WITHOUT CONTRAST TECHNIQUE: Multiplanar multisequence MR imaging of the pelvis was performed. No intravenous contrast was administered. COMPARISON:  None Available. FINDINGS: Urinary Tract:  No abnormality visualized. Bowel:  Unremarkable visualized pelvic bowel loops. Vascular/Lymphatic: Few prominent pelvic veins.  No lymphadenopathy. Reproductive:  Prior hysterectomy. Other:  None. Musculoskeletal: There is no evidence of acute fracture or avascular necrosis. No focal bone lesion. Mild osteoarthritis of the hips. No significant joint effusion. Mild tendinosis of the distal gluteus minimus tendons bilaterally with associated peritrochanteric edema, left greater than right. There is moderate tendinosis with partial tearing of the proximal hamstring tendons bilaterally. No retracted tendon tear. The hip adductors are intact. Loss of muscle bulk of the distal gluteus medius muscles at the myotendinous junction. Tensor fascia lata atrophy bilaterally. No significant intramuscular edema. No focal fluid collection.  IMPRESSION: Moderate tendinosis with partial tearing of the proximal hamstring tendons bilaterally. Mild tendinosis of the distal gluteus minimus tendons bilaterally with associated peritrochanteric edema, left greater than right. Mild osteoarthritis of the hips. Electronically Signed   By: Caprice Renshaw M.D.   On: 11/29/2022 10:07   MR Lumbar Spine w/o contrast  Result Date: 11/29/2022 CLINICAL DATA:  52 year old female with low back pain. Right leg and hip pain for 6 months. Claudication. EXAM: MRI LUMBAR SPINE WITHOUT CONTRAST TECHNIQUE: Multiplanar, multisequence MR imaging of the lumbar spine was performed. No intravenous contrast was administered. COMPARISON:  Lumbar radiographs 11/18/2022. FINDINGS: Segmentation:  Normal on the comparison. Alignment: Normal to mildly exaggerated lumbar lordosis. Subtle grade 1 anterolisthesis of L4 on L5 (only 2 mm on these images) was more conspicuous on the radiographs. No significant scoliosis. Vertebrae: Visualized bone marrow signal is within normal limits. No marrow edema or evidence of acute osseous abnormality. Intact visible sacrum. Conus medullaris and cauda equina: Conus extends to the L1 level. No lower spinal cord or conus signal abnormality. Paraspinal and other soft tissues: Negative; large body habitus. Disc levels: Multilevel lower thoracic disc desiccation and disc bulging is partially visible (series 3, image 9). Up to mild lower thoracic spinal stenosis at the level of the lower spinal cord (T11-T12:), but no definite cord mass effect or signal abnormality. T12-L1:  Negative. L1-L2:  Negative. L2-L3:  Negative disc.  Mild facet hypertrophy.  No stenosis. L3-L4: Negative disc. Mild right foraminal disc bulge and endplate spurring. Otherwise negative disc. Mild to moderate facet hypertrophy. Borderline to mild right L3 foraminal stenosis. L4-L5: Subtle anterolisthesis. Moderate facet and  ligament flavum hypertrophy. Degenerative facet joint fluid. No  spinal stenosis. Up to mild lateral recess stenosis (L5 nerve levels) and left L4 neural foraminal stenosis. L5-S1: Subtle disc desiccation. Broad-based central to right paracentral disc protrusion (series 6, image 38 and series 3, image 8). Mild facet hypertrophy. No spinal stenosis. Mild mass effect on the descending right S1 nerve roots in the lateral recess. Borderline to mild bilateral L5 foraminal stenosis. IMPRESSION: 1. Subtle anterolisthesis at L4-L5 with at least moderate facet arthropathy. No spinal stenosis but mild lateral recess and left foraminal stenosis at that level. 2. L5-S1 disc degeneration with a small rightward disc herniation most affecting the right lateral recess. Query right S1 radiculitis. 3. Partially visible lower thoracic disc degeneration with up to mild lower thoracic spinal stenosis. Visible lower spinal cord at those levels seems to remain normal. Electronically Signed   By: Odessa Fleming M.D.   On: 11/29/2022 09:56                 ROS: All systems reviewed are negative as they relate to the chief complaint within the history of present illness.  Patient denies fevers or chills.  Assessment & Plan: Visit Diagnoses:  1. Pain in right hip     Plan: Lauren Soto is a 52 y.o. female who presents to the office for review of MRI lumbar spine MRI pelvis.  She has multiple pathologies that are ongoing that may be contributing to her symptoms.  She has some small right-sided disc herniation at L5-S1 that does not really correlate with the symptoms that she is experiencing in the right leg but may be responsible for some of her low back pain.  She also has partial tearing of the proximal hamstring insertion that is worse right versus left but present on both sides which may be responsible for her right-sided symptoms and the hamstring weakness she is experiencing.  We did discuss options available to patient and she would like to try trochanteric injection today.  This was  administered under ultrasound guidance without complication.  Care was taken to avoid the gluteal tendons.  Patient tolerated procedure well and she will let us know how the injection does for her in about a week.  If no improvement, we will plan to set her up for lumbar spine ESI.  If both of those injections fail to provide her any lasting relief, then the most likely source of her pain is the partial tearing of the proximal hamstring which would likely require physical therapy for treatment, does not seem like an operative problem with lack of full-thickness tearing unless she fails physical therapy..  Follow-Up Instructions: No follow-ups on file.   Orders:  Orders Placed This Encounter  Procedures   US Guided Needle Placement - No Linked Charges   No orders of the defined types were placed in this encounter.     Procedures: Large Joint Inj: R greater trochanter on 11/29/2022 6:17 PM Indications: pain and diagnostic evaluation Details: 22 G 3.5 in needle, ultrasound-guided lateral approach  Arthrogram: No  Medications: 5 mL lidocaine 1 %; 4 mL bupivacaine 0.25 %; 40 mg methylPREDNISolone acetate 40 MG/ML Outcome: tolerated well, no immediate complications Procedure, treatment alternatives, risks and benefits explained, specific risks discussed. Consent was given by the patient. Immediately prior to procedure a time out was called to verify the correct patient, procedure, equipment, support staff and site/side marked as required. Patient was prepped and draped in the usual sterile fashion.  Clinical Data: No additional findings.  Objective: Vital Signs: LMP 06/04/2012   Physical Exam:  Constitutional: Patient appears well-developed HEENT:  Head: Normocephalic Eyes:EOM are normal Neck: Normal range of motion Cardiovascular: Normal rate Pulmonary/chest: Effort normal Neurologic: Patient is alert Skin: Skin is warm Psychiatric: Patient has normal mood and  affect  Ortho Exam: Ortho exam demonstrates increased pain with straight leg raise of the left lower extremity that elicits pain in the right lower extremity.  She has increased pain with straight leg raise of the right lower extremity as well.  Negative FADIR sign.  Positive Stinchfield sign.  She has point tenderness over the greater trochanter and the right hip rated moderate.  Tenderness throughout the lumbar spine.  She also has moderate to severe tenderness overlying the ischial tuberosity on the right with mild to moderate tenderness on the left ischial tuberosity.  She has asymmetric hamstring strength with 4/5 hamstring strength of the right leg and 5/5 strength in the left leg.  5/5 hip flexion, quadricep, dorsiflexion, plantarflexion, EHL of the bilateral lower extremities.  Specialty Comments:  No specialty comments available.  Imaging: No results found.   PMFS History: Patient Active Problem List   Diagnosis Date Noted   Long term current use of oral hypoglycemic drug 11/06/2022   Chronic right hip pain 11/06/2022   Hyperlipidemia 11/06/2022   Vestibular migraine 07/06/2018   Type 2 diabetes mellitus without complication (HCC) 06/18/2018   Asthma 04/21/2014   Menorrhagia 06/08/2012   Pelvic pain in female 06/08/2012   Hypertension 05/25/2012   Uterine fibroid 05/25/2012   Past Medical History:  Diagnosis Date   Diabetes mellitus without complication (HCC)    Hypertension    Vertigo     Family History  Problem Relation Age of Onset   Hypertension Mother    Multiple myeloma Mother    Arthritis Mother    Hypertension Father     Past Surgical History:  Procedure Laterality Date   ABDOMINAL HYSTERECTOMY     BUNIONECTOMY     CHOLECYSTECTOMY     SHOULDER ARTHROSCOPY     Social History   Occupational History   Not on file  Tobacco Use   Smoking status: Former    Types: Cigarettes    Quit date: 2009    Years since quitting: 15.4   Smokeless tobacco: Never    Tobacco comments:    "Every once in awhile will vape"  Substance and Sexual Activity   Alcohol use: No   Drug use: No   Sexual activity: Yes    Birth control/protection: Surgical

## 2022-12-02 MED ORDER — HYDROCHLOROTHIAZIDE 25 MG PO TABS: 25.0000 mg | ORAL_TABLET | Freq: Every day | ORAL | 0 refills | Status: DC

## 2022-12-02 MED ORDER — METFORMIN HCL 850 MG PO TABS: 850.0000 mg | ORAL_TABLET | Freq: Two times a day (BID) | ORAL | 0 refills | Status: DC

## 2022-12-02 MED ORDER — OMEPRAZOLE 20 MG PO CPDR: 20.0000 mg | DELAYED_RELEASE_CAPSULE | Freq: Every morning | ORAL | 0 refills | Status: DC

## 2022-12-02 MED ORDER — ATORVASTATIN CALCIUM 20 MG PO TABS: 20.0000 mg | ORAL_TABLET | Freq: Every day | ORAL | 0 refills | Status: DC

## 2022-12-02 MED ORDER — LISINOPRIL 40 MG PO TABS: 40.0000 mg | ORAL_TABLET | Freq: Every day | ORAL | 0 refills | Status: DC

## 2022-12-02 MED ORDER — MELOXICAM 15 MG PO TABS: 15.0000 mg | ORAL_TABLET | Freq: Every day | ORAL | 0 refills | Status: DC | PRN

## 2022-12-02 NOTE — Telephone Encounter (Signed)
Prescriptions that are daily sent to pharmacy.

## 2022-12-07 LAB — ALKALINE PHOSPHATASE, ISOENZYMES
Alkaline Phosphatase: 177 IU/L — ABNORMAL HIGH (ref 44–121)
BONE FRACTION: 20 % (ref 14–68)

## 2022-12-09 ENCOUNTER — Ambulatory Visit (INDEPENDENT_AMBULATORY_CARE_PROVIDER_SITE_OTHER): Payer: Medicaid Other | Admitting: Family Medicine

## 2022-12-09 ENCOUNTER — Ambulatory Visit (HOSPITAL_BASED_OUTPATIENT_CLINIC_OR_DEPARTMENT_OTHER)
Admission: RE | Admit: 2022-12-09 | Discharge: 2022-12-09 | Disposition: A | Discharge status: Home / Self Care | Payer: Medicaid Other | Source: Ambulatory Visit | Attending: Family Medicine | Admitting: Family Medicine

## 2022-12-09 ENCOUNTER — Encounter: Payer: Self-pay | Admitting: Family Medicine

## 2022-12-09 VITALS — BP 140/87 | HR 120 | Temp 99.0°F | Ht 60.0 in | Wt 197.0 lb

## 2022-12-09 DIAGNOSIS — J069 Acute upper respiratory infection, unspecified: Secondary | ICD-10-CM

## 2022-12-09 DIAGNOSIS — R000 Tachycardia, unspecified: Secondary | ICD-10-CM | POA: Diagnosis not present

## 2022-12-09 DIAGNOSIS — R051 Acute cough: Secondary | ICD-10-CM | POA: Insufficient documentation

## 2022-12-09 DIAGNOSIS — R059 Cough, unspecified: Secondary | ICD-10-CM | POA: Diagnosis not present

## 2022-12-09 LAB — CBC WITH DIFFERENTIAL/PLATELET
Basophils Absolute: 0.1 10*3/uL (ref 0.0–0.1)
Basophils Relative: 0.5 % (ref 0.0–3.0)
Eosinophils Absolute: 0 10*3/uL (ref 0.0–0.7)
Eosinophils Relative: 0.3 % (ref 0.0–5.0)
HCT: 38.9 % (ref 36.0–46.0)
Hemoglobin: 12.4 g/dL (ref 12.0–15.0)
Lymphocytes Relative: 16.7 % (ref 12.0–46.0)
Lymphs Abs: 2.4 10*3/uL (ref 0.7–4.0)
MCHC: 31.8 g/dL (ref 30.0–36.0)
MCV: 86.1 fl (ref 78.0–100.0)
Monocytes Absolute: 1 10*3/uL (ref 0.1–1.0)
Monocytes Relative: 6.9 % (ref 3.0–12.0)
Neutro Abs: 10.8 10*3/uL — ABNORMAL HIGH (ref 1.4–7.7)
Neutrophils Relative %: 75.6 % (ref 43.0–77.0)
Platelets: 419 10*3/uL — ABNORMAL HIGH (ref 150.0–400.0)
RBC: 4.52 Mil/uL (ref 3.87–5.11)
RDW: 15.3 % (ref 11.5–15.5)
WBC: 14.2 10*3/uL — ABNORMAL HIGH (ref 4.0–10.5)

## 2022-12-09 LAB — COMPREHENSIVE METABOLIC PANEL
ALT: 23 U/L (ref 0–35)
AST: 19 U/L (ref 0–37)
Albumin: 4.1 g/dL (ref 3.5–5.2)
Alkaline Phosphatase: 177 U/L — ABNORMAL HIGH (ref 39–117)
BUN: 6 mg/dL (ref 6–23)
CO2: 25 mEq/L (ref 19–32)
Calcium: 9.6 mg/dL (ref 8.4–10.5)
Chloride: 101 mEq/L (ref 96–112)
Creatinine, Ser: 0.67 mg/dL (ref 0.40–1.20)
GFR: 101.28 mL/min (ref >60.00)
Glucose, Bld: 153 mg/dL — ABNORMAL HIGH (ref 70–99)
Potassium: 4.3 mEq/L (ref 3.5–5.1)
Sodium: 138 mEq/L (ref 135–145)
Total Bilirubin: 0.5 mg/dL (ref 0.2–1.2)
Total Protein: 7.5 g/dL (ref 6.0–8.3)

## 2022-12-09 MED ORDER — AMOXICILLIN-POT CLAVULANATE 875-125 MG PO TABS
1.0000 | ORAL_TABLET | Freq: Two times a day (BID) | ORAL | 0 refills | Status: DC
Start: 2022-12-09 — End: 2023-01-20

## 2022-12-09 NOTE — Patient Instructions (Addendum)
EKG stable, ST 116 Chest xray today  Could be viral infection, but given your symptoms, tachycardia, etc I will go ahead and give you an antibiotic empirically. Continue supportive measures including rest, hydration, humidifier use, steam showers, warm compresses to sinuses, warm liquids with lemon and honey, and over-the-counter cough/cold (Coricidin) and analgesics (Tylenol) as needed.  Please stay well hydrated.   Please contact office for follow-up if symptoms do not improve or worsen. Seek emergency care if symptoms become severe. Strict ED precautions discussed.

## 2022-12-09 NOTE — Progress Notes (Signed)
Acute Office Visit  Subjective:     Patient ID: Lauren Soto, female    DOB: 1970-11-27, 53 y.o.   MRN: [id number]  Chief Complaint  Patient presents with   Cough     Patient is in today for URI.  Discussed the use of AI scribe software for clinical note transcription with the patient, who gave verbal consent to proceed.  History of Present Illness   The patient, with a history of asthma, presents with a cough that started last Wednesday or Thursday. Initially, they attributed the cough to a tickle in their throat from cleaning with a certain product. By Saturday, they began losing their voice, and by Sunday, they experienced head and ear pain. They also report having had crusty eyes since the previous Monday. The patient describes the throat pain as being due to the cough and somewhat from swallowing. They deny fever, chills, nausea, vomiting, and diarrhea. They also report feeling their heart racing earlier in the morning. They have taken Tylenol. They admit to possibly not staying adequately hydrated and have been eating less than usual. They deny being around anyone who has been sick. The patient has been producing some yellow sputum with the cough but denies difficulty breathing or shortness of breath. They also deny wheezing and state that their asthma has not been bothering them for a long time. The most bothersome symptoms are the ear and head pain, with some facial pain suggesting sinus involvement.            All review of systems negative except what is listed in the HPI      Objective:    BP (!) 140/87   Pulse (!) 120   Temp 99 F (37.2 C) (Oral)   Ht 5' (1.524 m)   Wt 197 lb (89.4 kg)   LMP 06/04/2012   SpO2 98%   BMI 38.47 kg/m    Physical Exam Vitals reviewed.  Constitutional:      Appearance: Normal appearance.  HENT:     Head: Normocephalic and atraumatic.     Ears:     Comments: Mild middle ear effusions bilaterally     Nose:  Congestion present.     Mouth/Throat:     Mouth: Mucous membranes are moist.     Pharynx: Oropharynx is clear. No oropharyngeal exudate or posterior oropharyngeal erythema.  Eyes:     Extraocular Movements: Extraocular movements intact.     Conjunctiva/sclera: Conjunctivae normal.  Cardiovascular:     Rate and Rhythm: Normal rate and regular rhythm.     Pulses: Normal pulses.     Heart sounds: Normal heart sounds.  Pulmonary:     Effort: Pulmonary effort is normal.     Breath sounds: Normal breath sounds.  Musculoskeletal:        General: Normal range of motion.     Cervical back: Normal range of motion and neck supple.  Skin:    General: Skin is warm and dry.  Neurological:     Mental Status: She is alert and oriented to person, place, and time.  Psychiatric:        Mood and Affect: Mood normal.        Behavior: Behavior normal.        Thought Content: Thought content normal.        Judgment: Judgment normal.     No results found for any visits on 12/09/22.      Assessment & Plan:   Problem List  Items Addressed This Visit   None Visit Diagnoses     Tachycardia    -  Primary   Relevant Orders   EKG 12-Lead (Completed)   DG Chest 2 View   CBC with Differential/Platelet   Comprehensive metabolic panel   Acute cough       Relevant Orders   EKG 12-Lead (Completed)   DG Chest 2 View   CBC with Differential/Platelet   Comprehensive metabolic panel   Upper respiratory tract infection, unspecified type       Relevant Medications   amoxicillin-clavulanate (AUGMENTIN) 875-125 MG tablet     EKG stable, ST 116 Chest xray today  Could be viral infection, but given your symptoms, tachycardia, etc I will go ahead and give you an antibiotic empirically. Continue supportive measures including rest, hydration, humidifier use, steam showers, warm compresses to sinuses, warm liquids with lemon and honey, and over-the-counter cough/cold (Coricidin) and analgesics (Tylenol) as  needed.  Please stay well hydrated.  Work note provided.  Please contact office for follow-up if symptoms do not improve or worsen. Seek emergency care if symptoms become severe. Strict ED precautions discussed.     Meds ordered this encounter  Medications   amoxicillin-clavulanate (AUGMENTIN) 875-125 MG tablet    Sig: Take 1 tablet by mouth 2 (two) times daily.    Dispense:  20 tablet    Refill:  0    Order Specific Question:   Supervising Provider    Answer:   Danise Edge A [[id number]]    Return if symptoms worsen or fail to improve.  Clayborne Dana, NP

## 2022-12-11 ENCOUNTER — Telehealth: Payer: Self-pay | Admitting: Neurology

## 2022-12-11 DIAGNOSIS — H44003 Unspecified purulent endophthalmitis, bilateral: Secondary | ICD-10-CM

## 2022-12-11 NOTE — Telephone Encounter (Signed)
Patient called with eye symptoms s/p being seen on Monday for sick symptoms.   Patient had trouble getting insurance approval for Augmentin and has only taken one dose last night. Some symptoms are better, but having yellow drainage from both eyes. Right worse than left. Right eye pain and vision changes. She is having to clean eye every hour. Using hot compresses.   Since she has only had one dosage of antibiotics, she will continue these today. If symptoms with eye worsen or don't get better by tomorrow she will call back (per Ladona Ridgel). Patient agrees with plan.

## 2022-12-12 MED ORDER — POLYMYXIN B-TRIMETHOPRIM 10000-0.1 UNIT/ML-% OP SOLN
1.0000 [drp] | Freq: Four times a day (QID) | OPHTHALMIC | 0 refills | Status: AC
Start: 2022-12-12 — End: 2022-12-17

## 2022-12-12 NOTE — Telephone Encounter (Signed)
Pt requested a call back from CMA

## 2022-12-12 NOTE — Telephone Encounter (Signed)
Patient states eyes are still red and itchy, but getting a little better. She would like drops sent to the pharmacy.   Patient was also asking for refills of medications that are not on our list. She states Metoprolol and Vitamin D. Does not know dosages. Let her know she will need to contact her old clinic or pharmacy and let us know as we have not received her old records.   Ladona Ridgel - please send eye drops to [facility] on file.

## 2022-12-12 NOTE — Addendum Note (Signed)
Addended by: Hyman Hopes B on: 12/12/2022 05:16 PM   Modules accepted: Orders

## 2022-12-18 ENCOUNTER — Telehealth: Payer: Self-pay | Admitting: Family Medicine

## 2022-12-18 NOTE — Telephone Encounter (Signed)
Spoke with patient. She states this is the number to the community clinic who she used to see. They have old labs, etc. No pap. Do not need current records at this time.

## 2022-12-18 NOTE — Telephone Encounter (Signed)
Pt states pcp was requesting records from a clinic and wanted to provide [place] with the fax #. [phone]

## 2022-12-20 ENCOUNTER — Telehealth: Payer: Self-pay | Admitting: Family Medicine

## 2022-12-20 ENCOUNTER — Other Ambulatory Visit: Payer: Self-pay

## 2022-12-20 ENCOUNTER — Telehealth: Payer: Self-pay | Admitting: Surgical

## 2022-12-20 DIAGNOSIS — M48061 Spinal stenosis, lumbar region without neurogenic claudication: Secondary | ICD-10-CM

## 2022-12-20 MED ORDER — VITAMIN D 50 MCG (2000 UT) PO CAPS: 2000.0000 [IU] | ORAL_CAPSULE | Freq: Every day | ORAL | 0 refills | Status: DC

## 2022-12-20 MED ORDER — METOPROLOL SUCCINATE ER 50 MG PO TB24: 50.0000 mg | ORAL_TABLET | Freq: Every day | ORAL | 0 refills | Status: DC

## 2022-12-20 NOTE — Telephone Encounter (Signed)
Patient called back, she states we needed her medication list from previous provider. She was on Metoprolol 50 mg but has been out for over a week. HR was 120 in office and has remained high at home. I did send a 30 day refill to pharmacy and requested her previous medication list. She is also aware Vitamin D is OTC but wanted to try to get as a prescription. RX sent.   Lauren Soto - FYI.

## 2022-12-20 NOTE — Telephone Encounter (Signed)
IC LMVM for her advising order for ESI had been placed per last OV note.

## 2022-12-20 NOTE — Telephone Encounter (Signed)
Patient called. Says she is back hurting again. Would like Luke to know. Her cb# [phone]

## 2022-12-20 NOTE — Addendum Note (Signed)
Addended bySilvio Pate on: 12/20/2022 02:41 PM   Modules accepted: Orders

## 2022-12-20 NOTE — Telephone Encounter (Signed)
Patient called and would like Jade to call her regarding some information that was from provider.

## 2022-12-21 NOTE — Telephone Encounter (Signed)
thanks

## 2022-12-26 DIAGNOSIS — H5213 Myopia, bilateral: Secondary | ICD-10-CM | POA: Diagnosis not present

## 2022-12-31 NOTE — Telephone Encounter (Signed)
Received medication list from previous provider, but it was not clear what patient was taking on this list. There were multiple duplicates, changes in dosages, and it was handwritten. I compared to what we have in our chart. The only medications on the list that are not on her current list in our chart are Lyrica (75mg  BID, 100 mg every day, and 100 mg TID) and Celebrex 200 mg BID. I did not add to current medication list, but documented here in case patient has questions at a future visit.   Ladona Ridgel - FYI.

## 2023-01-01 ENCOUNTER — Telehealth: Payer: Self-pay | Admitting: Surgical

## 2023-01-01 NOTE — Telephone Encounter (Signed)
Pt called requesting a call requesting a call back about her pain levels and also states a referral for injection but no one called. Unsure by chart if its Dr Alvester Morin. Please call pt at [phone]

## 2023-01-03 NOTE — Telephone Encounter (Signed)
Referral is still in pending process. Will talk with Charna Archer once she returns

## 2023-01-09 ENCOUNTER — Telehealth: Payer: Self-pay

## 2023-01-09 NOTE — Telephone Encounter (Signed)
Spoke with patient and scheduled injection for 01/20/23. Patient aware driver needed

## 2023-01-09 NOTE — Telephone Encounter (Signed)
-----   Message from [facility] Terri K sent at 01/09/2023  2:45 PM EDT ----- This patient left a message on my voice mail about getting this injection scheduled. It has been authorized -- 1st one I did today.

## 2023-01-20 ENCOUNTER — Ambulatory Visit (INDEPENDENT_AMBULATORY_CARE_PROVIDER_SITE_OTHER): Payer: Medicaid Other | Admitting: Physical Medicine and Rehabilitation

## 2023-01-20 ENCOUNTER — Other Ambulatory Visit: Payer: Self-pay

## 2023-01-20 VITALS — BP 159/108 | HR 101

## 2023-01-20 DIAGNOSIS — M5416 Radiculopathy, lumbar region: Secondary | ICD-10-CM | POA: Diagnosis not present

## 2023-01-20 MED ORDER — METHYLPREDNISOLONE ACETATE 80 MG/ML IJ SUSP
80.0000 mg | Freq: Once | INTRAMUSCULAR | Status: AC
Start: 2023-01-20 — End: 2023-01-20
  Administered 2023-01-20: 80 mg

## 2023-01-20 NOTE — Patient Instructions (Signed)

## 2023-01-20 NOTE — Progress Notes (Signed)
Functional Pain Scale - descriptive words and definitions  Distressing (6)    Pain is present/unable to complete most ADLs limited by pain/sleep is difficult and active distraction is only marginal. Moderate range order  Average Pain 8-9   +Driver, -BT, -Dye Allergies.  Lower back pain on right side that radiates into the right leg. Pain is starting on left side

## 2023-01-27 ENCOUNTER — Encounter: Payer: Self-pay | Admitting: Family Medicine

## 2023-01-27 ENCOUNTER — Ambulatory Visit (INDEPENDENT_AMBULATORY_CARE_PROVIDER_SITE_OTHER): Payer: Medicaid Other | Admitting: Family Medicine

## 2023-01-27 VITALS — BP 152/104 | HR 124 | Ht 60.0 in | Wt 196.0 lb

## 2023-01-27 DIAGNOSIS — R000 Tachycardia, unspecified: Secondary | ICD-10-CM

## 2023-01-27 DIAGNOSIS — H6593 Unspecified nonsuppurative otitis media, bilateral: Secondary | ICD-10-CM

## 2023-01-27 DIAGNOSIS — R0609 Other forms of dyspnea: Secondary | ICD-10-CM | POA: Diagnosis not present

## 2023-01-27 DIAGNOSIS — I10 Essential (primary) hypertension: Secondary | ICD-10-CM | POA: Diagnosis not present

## 2023-01-27 DIAGNOSIS — R748 Abnormal levels of other serum enzymes: Secondary | ICD-10-CM

## 2023-01-27 MED ORDER — FLUTICASONE PROPIONATE 50 MCG/ACT NA SUSP
2.0000 | Freq: Every day | NASAL | 6 refills | Status: DC
Start: 2023-01-27 — End: 2023-02-27

## 2023-01-27 NOTE — Assessment & Plan Note (Signed)
Hypertension: Elevated blood pressure and heart rate, likely due to discontinuation of antihypertensive medications (Hydrochlorothiazide 25mg , Lisinopril 40mg , Metoprolol 50mg ) for the past three days. Mild shortness of breath reported, but no chest pain. No significant lower extremity edema noted. -Resume all antihypertensive medications as previously prescribed. -Check blood pressure and pulse rate at home, 1-2 hours after taking morning medications, and record results. -Return for follow-up visit in 4-7 days to reassess blood pressure and heart rate.

## 2023-01-27 NOTE — Progress Notes (Signed)
Acute Office Visit  Subjective:     Patient ID: Lauren Soto, female    DOB: Jan 19, 1971, 52 y.o.   MRN: [id number]  Chief Complaint  Patient presents with   Nasal Congestion    HPI Patient is in today for headache.   Discussed the use of AI scribe software for clinical note transcription with the patient, who gave verbal consent to proceed.  History of Present Illness   The patient, with a history of hypertension, presented with a three-day history of headache, nasal congestion, and ear pain. The headache was generalized but more severe in the back of the head and top of the forehead. The patient also reported sensitivity in the eyes, which had improved slightly since the onset of symptoms. The ear pain was bilateral, with the left side being more affected. The patient denied any preceding cold or allergy symptoms and had not been around anyone who was sick.  The patient also reported a rapid heart rate, even while on medication (Hydrochlorothiazide 25mg , Lisinopril 40mg , and Metoprolol 50mg ). They had not taken their medication for three days prior to the consultation due to feeling unwell. The patient reported shortness of breath with activity and a sensation of their heart beating fast, but denied any palpitations or skipped beats.  The patient also reported some shortness of breath with activity since their last visit. They denied any chest pain. The patient had not been eating or drinking much since the onset of symptoms, suggesting possible dehydration.  Previous consultations also showed consistently high blood pressure, even while on medication.              ROS All review of systems negative except what is listed in the HPI      Objective:    BP (!) 152/104   Pulse (!) 124   Ht 5' (1.524 m)   Wt 196 lb (88.9 kg)   LMP 06/04/2012   SpO2 97%   BMI 38.28 kg/m    Physical Exam Vitals reviewed.  Constitutional:      General: She is not in acute  distress.    Appearance: Normal appearance. She is obese. She is not ill-appearing.  HENT:     Head: Normocephalic and atraumatic.     Ears:     Comments: Mild middle ear effusions bilaterally     Nose: No congestion or rhinorrhea.     Mouth/Throat:     Mouth: Mucous membranes are moist.     Pharynx: Oropharynx is clear. No oropharyngeal exudate or posterior oropharyngeal erythema.  Eyes:     Extraocular Movements: Extraocular movements intact.     Conjunctiva/sclera: Conjunctivae normal.  Cardiovascular:     Rate and Rhythm: Normal rate and regular rhythm.     Pulses: Normal pulses.     Heart sounds: Normal heart sounds.  Pulmonary:     Effort: Pulmonary effort is normal.     Breath sounds: Normal breath sounds.  Musculoskeletal:        General: Normal range of motion.     Cervical back: Normal range of motion and neck supple.     Right lower leg: No edema.     Left lower leg: No edema.  Skin:    General: Skin is warm and dry.  Neurological:     General: No focal deficit present.     Mental Status: She is alert and oriented to person, place, and time. Mental status is at baseline.     Motor: No  weakness.     Gait: Gait normal.  Psychiatric:        Mood and Affect: Mood normal.        Behavior: Behavior normal.        Thought Content: Thought content normal.        Judgment: Judgment normal.     No results found for any visits on 01/27/23.      Assessment & Plan:   Problem List Items Addressed This Visit     Hypertension (Chronic)    Hypertension: Elevated blood pressure and heart rate, likely due to discontinuation of antihypertensive medications (Hydrochlorothiazide 25mg , Lisinopril 40mg , Metoprolol 50mg ) for the past three days. Mild shortness of breath reported, but no chest pain. No significant lower extremity edema noted. -Resume all antihypertensive medications as previously prescribed. -Check blood pressure and pulse rate at home, 1-2 hours after taking  morning medications, and record results. -Return for follow-up visit in 4-7 days to reassess blood pressure and heart rate.      Relevant Orders   Ambulatory referral to Cardiology   Other Visit Diagnoses     Dyspnea on exertion    -  Primary Tachycardia   Shortness of Breath with Exertion: New onset of dyspnea on exertion. She has been tachycardic lately (missed several doses of beta-blocker recently). Lungs clear on auscultation. -Initiate referral to Cardiology for further evaluation, including possible echocardiogram.  -EKG stable at last visit, declined repeat    Relevant Orders   Ambulatory referral to Cardiology              Fluid level behind tympanic membrane of both ears     No preceding cold or allergy symptoms. No recent swimming or water exposure. No significant nasal congestion or sinus tenderness. Some fluid noted in ears. -Start Flonase nasal spray to help with ear drainage.      Relevant Medications   fluticasone (FLONASE) 50 MCG/ACT nasal spray              Meds ordered this encounter  Medications   fluticasone (FLONASE) 50 MCG/ACT nasal spray    Sig: Place 2 sprays into both nostrils daily.    Dispense:  16 g    Refill:  6    Order Specific Question:   Supervising Provider    Answer:   Danise Edge A [[id number]]    Return for 4-6 days BP/HR follow-up.  Clayborne Dana, NP

## 2023-01-28 DIAGNOSIS — R55 Syncope and collapse: Secondary | ICD-10-CM | POA: Diagnosis not present

## 2023-01-28 DIAGNOSIS — R9431 Abnormal electrocardiogram [ECG] [EKG]: Secondary | ICD-10-CM | POA: Diagnosis not present

## 2023-01-28 DIAGNOSIS — R61 Generalized hyperhidrosis: Secondary | ICD-10-CM | POA: Diagnosis not present

## 2023-01-28 DIAGNOSIS — R000 Tachycardia, unspecified: Secondary | ICD-10-CM | POA: Diagnosis not present

## 2023-01-28 DIAGNOSIS — Z20822 Contact with and (suspected) exposure to covid-19: Secondary | ICD-10-CM | POA: Diagnosis not present

## 2023-01-28 DIAGNOSIS — I959 Hypotension, unspecified: Secondary | ICD-10-CM | POA: Diagnosis not present

## 2023-01-29 ENCOUNTER — Other Ambulatory Visit: Payer: Self-pay | Admitting: Family Medicine

## 2023-01-29 NOTE — Procedures (Signed)
Lumbar Epidural Steroid Injection - Interlaminar Approach with Fluoroscopic Guidance  Patient: Lauren Soto      Date of Birth: 1970-08-10 MRN: [id number] PCP: Clayborne Dana, NP      Visit Date: 01/20/2023   Universal Protocol:     Consent Given By: the patient  Position: PRONE  Additional Comments: Vital signs were monitored before and after the procedure. Patient was prepped and draped in the usual sterile fashion. The correct patient, procedure, and site was verified.   Injection Procedure Details:   Procedure diagnoses: Lumbar radiculopathy [M54.16]   Meds Administered:  Meds ordered this encounter  Medications   methylPREDNISolone acetate (DEPO-MEDROL) injection 80 mg     Laterality: Right  Location/Site:  L5-S1  Needle: 3.5 in., 20 ga. Tuohy  Needle Placement: Paramedian epidural  Findings:   -Comments: Excellent flow of contrast into the epidural space.  Procedure Details: Using a paramedian approach from the side mentioned above, the region overlying the inferior lamina was localized under fluoroscopic visualization and the soft tissues overlying this structure were infiltrated with 4 ml. of 1% Lidocaine without Epinephrine. The Tuohy needle was inserted into the epidural space using a paramedian approach.   The epidural space was localized using loss of resistance along with counter oblique bi-planar fluoroscopic views.  After negative aspirate for air, blood, and CSF, a 2 ml. volume of Isovue-250 was injected into the epidural space and the flow of contrast was observed. Radiographs were obtained for documentation purposes.    The injectate was administered into the level noted above.   Additional Comments:  No complications occurred Dressing: 2 x 2 sterile gauze and Band-Aid    Post-procedure details: Patient was observed during the procedure. Post-procedure instructions were reviewed.  Patient left the clinic in stable condition.

## 2023-01-29 NOTE — Progress Notes (Signed)
Lauren Soto - 52 y.o. female MRN [id number]  Date of birth: 11-Jan-1971  Office Visit Note: Visit Date: 01/20/2023 PCP: Clayborne Dana, NP Referred by: Clayborne Dana, NP  Subjective: Chief Complaint  Patient presents with   Lower Back - Pain   HPI:  Lauren Soto is a 52 y.o. female who comes in today at the request of Karenann Cai, PA-C for planned Right L5-S1 Lumbar Interlaminar epidural steroid injection with fluoroscopic guidance.  The patient has failed conservative care including home exercise, medications, time and activity modification.  This injection will be diagnostic and hopefully therapeutic.  Please see requesting physician notes for further details and justification.   ROS Otherwise per HPI.  Assessment & Plan: Visit Diagnoses:    ICD-10-CM   1. Lumbar radiculopathy  M54.16 XR C-ARM NO REPORT    Epidural Steroid injection    methylPREDNISolone acetate (DEPO-MEDROL) injection 80 mg      Plan: No additional findings.   Meds & Orders:  Meds ordered this encounter  Medications   methylPREDNISolone acetate (DEPO-MEDROL) injection 80 mg    Orders Placed This Encounter  Procedures   XR C-ARM NO REPORT   Epidural Steroid injection    Follow-up: Return for visit to requesting provider as needed.   Procedures: No procedures performed  Lumbar Epidural Steroid Injection - Interlaminar Approach with Fluoroscopic Guidance  Patient: Lauren Soto      Date of Birth: 1971/06/16 MRN: [id number] PCP: Clayborne Dana, NP      Visit Date: 01/20/2023   Universal Protocol:     Consent Given By: the patient  Position: PRONE  Additional Comments: Vital signs were monitored before and after the procedure. Patient was prepped and draped in the usual sterile fashion. The correct patient, procedure, and site was verified.   Injection Procedure Details:   Procedure diagnoses: Lumbar radiculopathy [M54.16]   Meds Administered:  Meds  ordered this encounter  Medications   methylPREDNISolone acetate (DEPO-MEDROL) injection 80 mg     Laterality: Right  Location/Site:  L5-S1  Needle: 3.5 in., 20 ga. Tuohy  Needle Placement: Paramedian epidural  Findings:   -Comments: Excellent flow of contrast into the epidural space.  Procedure Details: Using a paramedian approach from the side mentioned above, the region overlying the inferior lamina was localized under fluoroscopic visualization and the soft tissues overlying this structure were infiltrated with 4 ml. of 1% Lidocaine without Epinephrine. The Tuohy needle was inserted into the epidural space using a paramedian approach.   The epidural space was localized using loss of resistance along with counter oblique bi-planar fluoroscopic views.  After negative aspirate for air, blood, and CSF, a 2 ml. volume of Isovue-250 was injected into the epidural space and the flow of contrast was observed. Radiographs were obtained for documentation purposes.    The injectate was administered into the level noted above.   Additional Comments:  No complications occurred Dressing: 2 x 2 sterile gauze and Band-Aid    Post-procedure details: Patient was observed during the procedure. Post-procedure instructions were reviewed.  Patient left the clinic in stable condition.   Clinical History: MRI LUMBAR SPINE WITHOUT CONTRAST   TECHNIQUE: Multiplanar, multisequence MR imaging of the lumbar spine was performed. No intravenous contrast was administered.   COMPARISON:  Lumbar radiographs 11/18/2022.   FINDINGS: Segmentation:  Normal on the comparison.   Alignment: Normal to mildly exaggerated lumbar lordosis. Subtle grade 1 anterolisthesis of L4 on L5 (only 2 mm on these  images) was more conspicuous on the radiographs. No significant scoliosis.   Vertebrae: Visualized bone marrow signal is within normal limits. No marrow edema or evidence of acute osseous abnormality.  Intact visible sacrum.   Conus medullaris and cauda equina: Conus extends to the L1 level. No lower spinal cord or conus signal abnormality.   Paraspinal and other soft tissues: Negative; large body habitus.   Disc levels:   Multilevel lower thoracic disc desiccation and disc bulging is partially visible (series 3, image 9). Up to mild lower thoracic spinal stenosis at the level of the lower spinal cord (T11-T12:), but no definite cord mass effect or signal abnormality.   T12-L1:  Negative.   L1-L2:  Negative.   L2-L3:  Negative disc.  Mild facet hypertrophy.  No stenosis.   L3-L4: Negative disc. Mild right foraminal disc bulge and endplate spurring. Otherwise negative disc. Mild to moderate facet hypertrophy. Borderline to mild right L3 foraminal stenosis.   L4-L5: Subtle anterolisthesis. Moderate facet and ligament flavum hypertrophy. Degenerative facet joint fluid. No spinal stenosis. Up to mild lateral recess stenosis (L5 nerve levels) and left L4 neural foraminal stenosis.   L5-S1: Subtle disc desiccation. Broad-based central to right paracentral disc protrusion (series 6, image 38 and series 3, image 8). Mild facet hypertrophy. No spinal stenosis. Mild mass effect on the descending right S1 nerve roots in the lateral recess. Borderline to mild bilateral L5 foraminal stenosis.   IMPRESSION: 1. Subtle anterolisthesis at L4-L5 with at least moderate facet arthropathy. No spinal stenosis but mild lateral recess and left foraminal stenosis at that level. 2. L5-S1 disc degeneration with a small rightward disc herniation most affecting the right lateral recess. Query right S1 radiculitis. 3. Partially visible lower thoracic disc degeneration with up to mild lower thoracic spinal stenosis. Visible lower spinal cord at those levels seems to remain normal.     Electronically Signed   By: Odessa Fleming M.D.     Objective:  VS:  HT:    WT:   BMI:     BP:(!) 159/108  HR:(!)  101bpm  TEMP: ( )  RESP:  Physical Exam Vitals and nursing note reviewed.  Constitutional:      General: She is not in acute distress.    Appearance: Normal appearance. She is not ill-appearing.  HENT:     Head: Normocephalic and atraumatic.     Right Ear: External ear normal.     Left Ear: External ear normal.  Eyes:     Extraocular Movements: Extraocular movements intact.  Cardiovascular:     Rate and Rhythm: Normal rate.     Pulses: Normal pulses.  Pulmonary:     Effort: Pulmonary effort is normal. No respiratory distress.  Abdominal:     General: There is no distension.     Palpations: Abdomen is soft.  Musculoskeletal:        General: Tenderness present.     Cervical back: Neck supple.     Right lower leg: No edema.     Left lower leg: No edema.     Comments: Patient has good distal strength with no pain over the greater trochanters.  No clonus or focal weakness.  Skin:    Findings: No erythema, lesion or rash.  Neurological:     General: No focal deficit present.     Mental Status: She is alert and oriented to person, place, and time.     Sensory: No sensory deficit.     Motor: No weakness  or abnormal muscle tone.     Coordination: Coordination normal.  Psychiatric:        Mood and Affect: Mood normal.        Behavior: Behavior normal.      Imaging: No results found.

## 2023-02-02 ENCOUNTER — Emergency Department (HOSPITAL_BASED_OUTPATIENT_CLINIC_OR_DEPARTMENT_OTHER): Payer: Medicaid Other

## 2023-02-02 ENCOUNTER — Inpatient Hospital Stay (HOSPITAL_BASED_OUTPATIENT_CLINIC_OR_DEPARTMENT_OTHER)
Admission: EM | Admit: 2023-02-02 | Discharge: 2023-02-04 | DRG: 872 | Disposition: A | Discharge status: Home / Self Care | Payer: Medicaid Other | Attending: Internal Medicine | Admitting: Internal Medicine

## 2023-02-02 ENCOUNTER — Other Ambulatory Visit: Payer: Self-pay

## 2023-02-02 ENCOUNTER — Encounter (HOSPITAL_BASED_OUTPATIENT_CLINIC_OR_DEPARTMENT_OTHER): Payer: Self-pay | Admitting: Emergency Medicine

## 2023-02-02 DIAGNOSIS — N179 Acute kidney failure, unspecified: Secondary | ICD-10-CM | POA: Diagnosis present

## 2023-02-02 DIAGNOSIS — H9312 Tinnitus, left ear: Secondary | ICD-10-CM | POA: Diagnosis present

## 2023-02-02 DIAGNOSIS — E669 Obesity, unspecified: Secondary | ICD-10-CM | POA: Diagnosis present

## 2023-02-02 DIAGNOSIS — Z9049 Acquired absence of other specified parts of digestive tract: Secondary | ICD-10-CM

## 2023-02-02 DIAGNOSIS — Z1152 Encounter for screening for COVID-19: Secondary | ICD-10-CM

## 2023-02-02 DIAGNOSIS — E860 Dehydration: Secondary | ICD-10-CM | POA: Diagnosis present

## 2023-02-02 DIAGNOSIS — Z79899 Other long term (current) drug therapy: Secondary | ICD-10-CM

## 2023-02-02 DIAGNOSIS — Z8249 Family history of ischemic heart disease and other diseases of the circulatory system: Secondary | ICD-10-CM

## 2023-02-02 DIAGNOSIS — I10 Essential (primary) hypertension: Secondary | ICD-10-CM | POA: Diagnosis present

## 2023-02-02 DIAGNOSIS — Z90710 Acquired absence of both cervix and uterus: Secondary | ICD-10-CM | POA: Diagnosis not present

## 2023-02-02 DIAGNOSIS — K219 Gastro-esophageal reflux disease without esophagitis: Secondary | ICD-10-CM | POA: Diagnosis present

## 2023-02-02 DIAGNOSIS — R197 Diarrhea, unspecified: Secondary | ICD-10-CM | POA: Diagnosis present

## 2023-02-02 DIAGNOSIS — R110 Nausea: Secondary | ICD-10-CM | POA: Diagnosis not present

## 2023-02-02 DIAGNOSIS — N3000 Acute cystitis without hematuria: Secondary | ICD-10-CM | POA: Diagnosis present

## 2023-02-02 DIAGNOSIS — Z87891 Personal history of nicotine dependence: Secondary | ICD-10-CM

## 2023-02-02 DIAGNOSIS — H9202 Otalgia, left ear: Secondary | ICD-10-CM | POA: Diagnosis present

## 2023-02-02 DIAGNOSIS — E872 Acidosis, unspecified: Secondary | ICD-10-CM | POA: Diagnosis present

## 2023-02-02 DIAGNOSIS — R6520 Severe sepsis without septic shock: Secondary | ICD-10-CM | POA: Diagnosis present

## 2023-02-02 DIAGNOSIS — E119 Type 2 diabetes mellitus without complications: Secondary | ICD-10-CM | POA: Diagnosis present

## 2023-02-02 DIAGNOSIS — E785 Hyperlipidemia, unspecified: Secondary | ICD-10-CM | POA: Diagnosis present

## 2023-02-02 DIAGNOSIS — A419 Sepsis, unspecified organism: Secondary | ICD-10-CM | POA: Diagnosis not present

## 2023-02-02 DIAGNOSIS — Z79890 Hormone replacement therapy (postmenopausal): Secondary | ICD-10-CM

## 2023-02-02 DIAGNOSIS — Z791 Long term (current) use of non-steroidal anti-inflammatories (NSAID): Secondary | ICD-10-CM

## 2023-02-02 DIAGNOSIS — Z9079 Acquired absence of other genital organ(s): Secondary | ICD-10-CM

## 2023-02-02 DIAGNOSIS — R42 Dizziness and giddiness: Secondary | ICD-10-CM | POA: Diagnosis present

## 2023-02-02 DIAGNOSIS — Z6839 Body mass index (BMI) 39.0-39.9, adult: Secondary | ICD-10-CM

## 2023-02-02 DIAGNOSIS — J9811 Atelectasis: Secondary | ICD-10-CM | POA: Diagnosis not present

## 2023-02-02 DIAGNOSIS — Z7984 Long term (current) use of oral hypoglycemic drugs: Secondary | ICD-10-CM

## 2023-02-02 HISTORY — DX: Acute kidney failure, unspecified: N17.9

## 2023-02-02 LAB — COMPREHENSIVE METABOLIC PANEL
ALT: 13 U/L (ref 0–44)
AST: 15 U/L (ref 15–41)
Albumin: 3.5 g/dL (ref 3.5–5.0)
Alkaline Phosphatase: 131 U/L — ABNORMAL HIGH (ref 38–126)
Anion gap: 15 (ref 5–15)
BUN: 37 mg/dL — ABNORMAL HIGH (ref 6–20)
CO2: 22 mmol/L (ref 22–32)
Calcium: 9.3 mg/dL (ref 8.9–10.3)
Chloride: 97 mmol/L — ABNORMAL LOW (ref 98–111)
Creatinine, Ser: 3.7 mg/dL — ABNORMAL HIGH (ref 0.44–1.00)
GFR, Estimated: 14 mL/min — ABNORMAL LOW (ref >60)
Glucose, Bld: 235 mg/dL — ABNORMAL HIGH (ref 70–99)
Potassium: 3.6 mmol/L (ref 3.5–5.1)
Sodium: 134 mmol/L — ABNORMAL LOW (ref 135–145)
Total Bilirubin: 0.5 mg/dL (ref 0.3–1.2)
Total Protein: 7.1 g/dL (ref 6.5–8.1)

## 2023-02-02 LAB — CBC WITH DIFFERENTIAL/PLATELET
Abs Immature Granulocytes: 0.05 10*3/uL (ref 0.00–0.07)
Basophils Absolute: 0 10*3/uL (ref 0.0–0.1)
Basophils Relative: 0 %
Eosinophils Absolute: 0.1 10*3/uL (ref 0.0–0.5)
Eosinophils Relative: 1 %
HCT: 40.6 % (ref 36.0–46.0)
Hemoglobin: 13.3 g/dL (ref 12.0–15.0)
Immature Granulocytes: 0 %
Lymphocytes Relative: 18 %
Lymphs Abs: 3.3 10*3/uL (ref 0.7–4.0)
MCH: 27.5 pg (ref 26.0–34.0)
MCHC: 32.8 g/dL (ref 30.0–36.0)
MCV: 83.9 fL (ref 80.0–100.0)
Monocytes Absolute: 1.1 10*3/uL — ABNORMAL HIGH (ref 0.1–1.0)
Monocytes Relative: 6 %
Neutro Abs: 13.6 10*3/uL — ABNORMAL HIGH (ref 1.7–7.7)
Neutrophils Relative %: 75 %
Platelets: 390 10*3/uL (ref 150–400)
RBC: 4.84 MIL/uL (ref 3.87–5.11)
RDW: 15.2 % (ref 11.5–15.5)
WBC: 18.2 10*3/uL — ABNORMAL HIGH (ref 4.0–10.5)
nRBC: 0 % (ref 0.0–0.2)

## 2023-02-02 LAB — URINALYSIS, ROUTINE W REFLEX MICROSCOPIC
Glucose, UA: 100 mg/dL — AB
Ketones, ur: NEGATIVE mg/dL
Leukocytes,Ua: NEGATIVE
Nitrite: NEGATIVE
Protein, ur: >=300 mg/dL — AB
Specific Gravity, Urine: >=1.03 (ref 1.005–1.030)
pH: 5.5 (ref 5.0–8.0)

## 2023-02-02 LAB — OCCULT BLOOD X 1 CARD TO LAB, STOOL: Fecal Occult Bld: NEGATIVE

## 2023-02-02 LAB — RESP PANEL BY RT-PCR (RSV, FLU A&B, COVID)  RVPGX2
Influenza A by PCR: NEGATIVE
Influenza B by PCR: NEGATIVE
Resp Syncytial Virus by PCR: NEGATIVE
SARS Coronavirus 2 by RT PCR: NEGATIVE

## 2023-02-02 LAB — APTT: aPTT: 27 seconds (ref 24–36)

## 2023-02-02 LAB — URINALYSIS, MICROSCOPIC (REFLEX)

## 2023-02-02 LAB — LACTIC ACID, PLASMA
Lactic Acid, Venous: 3.5 mmol/L (ref 0.5–1.9)
Lactic Acid, Venous: 3.6 mmol/L (ref 0.5–1.9)
Lactic Acid, Venous: 2.2 mmol/L (ref 0.5–1.9)

## 2023-02-02 LAB — PROTIME-INR
INR: 1 (ref 0.8–1.2)
Prothrombin Time: 13.6 seconds (ref 11.4–15.2)

## 2023-02-02 MED ORDER — VANCOMYCIN HCL IN DEXTROSE 1-5 GM/200ML-% IV SOLN
1000.0000 mg | Freq: Once | INTRAVENOUS | Status: AC
  Administered 2023-02-03: 1000 mg via INTRAVENOUS
  Filled 2023-02-02: qty 200

## 2023-02-02 MED ORDER — SODIUM CHLORIDE 0.9 % IV SOLN: INTRAVENOUS | Status: DC

## 2023-02-02 MED ORDER — SODIUM CHLORIDE 0.9 % IV BOLUS
1000.0000 mL | Freq: Once | INTRAVENOUS | Status: AC
  Administered 2023-02-02: 1000 mL via INTRAVENOUS

## 2023-02-02 MED ORDER — LACTATED RINGERS IV SOLN: INTRAVENOUS | Status: DC

## 2023-02-02 MED ORDER — SODIUM CHLORIDE 0.9 % IV SOLN
500.0000 mg | INTRAVENOUS | Status: DC
  Administered 2023-02-02: 500 mg via INTRAVENOUS
  Filled 2023-02-02: qty 5

## 2023-02-02 MED ORDER — VANCOMYCIN HCL IN DEXTROSE 1-5 GM/200ML-% IV SOLN
1000.0000 mg | Freq: Once | INTRAVENOUS | Status: AC
  Administered 2023-02-02: 1000 mg via INTRAVENOUS
  Filled 2023-02-02: qty 200

## 2023-02-02 MED ORDER — SODIUM CHLORIDE 0.9 % IV BOLUS (SEPSIS)
1000.0000 mL | Freq: Once | INTRAVENOUS | Status: AC
  Administered 2023-02-02: 1000 mL via INTRAVENOUS

## 2023-02-02 MED ORDER — HYDROCORTISONE SOD SUC (PF) 100 MG IJ SOLR
100.0000 mg | Freq: Once | INTRAMUSCULAR | Status: AC
  Administered 2023-02-02: 100 mg via INTRAVENOUS
  Filled 2023-02-02: qty 2

## 2023-02-02 MED ORDER — SODIUM CHLORIDE 0.9 % IV SOLN
2.0000 g | INTRAVENOUS | Status: DC
  Administered 2023-02-02: 2 g via INTRAVENOUS
  Filled 2023-02-02: qty 20

## 2023-02-02 NOTE — Progress Notes (Signed)
ED Pharmacy Antibiotic Sign Off An antibiotic consult was received from an ED provider for vancomycin per pharmacy dosing for sepsis. A chart review was completed to assess appropriateness.   The following one time order(s) were placed:   -Vancomycin 2000 mg IV once  Further antibiotic and/or antibiotic pharmacy consults should be ordered by the admitting provider if indicated.   Thank you for allowing pharmacy to be a part of this patient's care.   Arabella Merles, [facility]  Clinical Pharmacist 02/02/23 10:32 PM

## 2023-02-02 NOTE — ED Triage Notes (Signed)
Dizziness, nausea, and generalized weakness since 7/27. Evaluated and discharged from [facility] on 7/30 for same. Reports at that time was bradycardic, hypotensive and had syncopal episode.

## 2023-02-02 NOTE — Progress Notes (Signed)
[facility] following for sepsis protocol. 

## 2023-02-02 NOTE — ED Notes (Signed)
ED TO INPATIENT HANDOFF REPORT  ED Nurse Name and Phone #:   S Name/Age/Gender Lauren Soto 52 y.o. female Room/Bed: MH04/MH04  Code Status   Code Status: Not on file  Home/SNF/Other Home Patient oriented to: self, place, time, and situation Is this baseline? Yes   Triage Complete: Triage complete  Chief Complaint AKI (acute kidney injury) (HCC) [N17.9]  Triage Note Dizziness, nausea, and generalized weakness since 7/27. Evaluated and discharged from [facility] on 7/30 for same. Reports at that time was bradycardic, hypotensive and had syncopal episode.    Allergies No Known Allergies  Level of Care/Admitting Diagnosis ED Disposition     ED Disposition  Admit   Condition  --   Comment  Hospital Area: [facility] [[id number]]  Level of Care: Progressive [102]  Admit to Progressive based on following criteria: MULTISYSTEM THREATS such as stable sepsis, metabolic/electrolyte imbalance with or without encephalopathy that is responding to early treatment.  May admit patient to [facility] or [facility] if equivalent level of care is available:: Yes  Interfacility transfer: Yes  Covid Evaluation: Asymptomatic - no recent exposure (last 10 days) testing not required  Diagnosis: AKI (acute kidney injury) [facility]) [[id number]]  Admitting Physician: Arlean Hopping [[id number]]  Attending Physician: Leanna Sato  Certification:: I certify this patient will need inpatient services for at least 2 midnights  Estimated Length of Stay: 2          B Medical/Surgery History Past Medical History:  Diagnosis Date   Diabetes mellitus without complication (HCC)    Hypertension    Vertigo    Past Surgical History:  Procedure Laterality Date   ABDOMINAL HYSTERECTOMY     BUNIONECTOMY     CHOLECYSTECTOMY     SHOULDER ARTHROSCOPY       A IV Location/Drains/Wounds Patient Lines/Drains/Airways Status     Active Line/Drains/Airways     Name  Placement date Placement time Site Days   Peripheral IV 02/02/23 20 G 1" Right Antecubital 02/02/23  1805  Antecubital  less than 1   Peripheral IV 02/02/23 20 G 1" Left Antecubital 02/02/23  1829  Antecubital  less than 1            Intake/Output Last 24 hours  Intake/Output Summary (Last 24 hours) at 02/02/2023 2345 Last data filed at 02/02/2023 2159 Gross per 24 hour  Intake 4092.38 ml  Output --  Net 4092.38 ml    Labs/Imaging Results for orders placed or performed during the hospital encounter of 02/02/23 (from the past 48 hour(s))  Resp panel by RT-PCR (RSV, Flu A&B, Covid)     Status: None   Collection Time: 02/02/23  5:59 PM   Specimen: Nasal Swab  Result Value Ref Range   SARS Coronavirus 2 by RT PCR NEGATIVE NEGATIVE    Comment: (NOTE) SARS-CoV-2 target nucleic acids are NOT DETECTED.  The SARS-CoV-2 RNA is generally detectable in upper respiratory specimens during the acute phase of infection. The lowest concentration of SARS-CoV-2 viral copies this assay can detect is 138 copies/mL. A negative result does not preclude SARS-Cov-2 infection and should not be used as the sole basis for treatment or other patient management decisions. A negative result may occur with  improper specimen collection/handling, submission of specimen other than nasopharyngeal swab, presence of viral mutation(s) within the areas targeted by this assay, and inadequate number of viral copies(<138 copies/mL). A negative result must be combined with clinical observations, patient history, and epidemiological information. The expected result  is Negative.  Fact Sheet for Patients:  BloggerCourse.com  Fact Sheet for Healthcare Providers:  SeriousBroker.it  This test is no t yet approved or cleared by the [place] FDA and  has been authorized for detection and/or diagnosis of SARS-CoV-2 by FDA under an Emergency Use Authorization (EUA).  This EUA will remain  in effect (meaning this test can be used) for the duration of the COVID-19 declaration under Section 564(b)(1) of the Act, 21 U.S.C.section 360bbb-3(b)(1), unless the authorization is terminated  or revoked sooner.       Influenza A by PCR NEGATIVE NEGATIVE   Influenza B by PCR NEGATIVE NEGATIVE    Comment: (NOTE) The Xpert Xpress SARS-CoV-2/FLU/RSV plus assay is intended as an aid in the diagnosis of influenza from Nasopharyngeal swab specimens and should not be used as a sole basis for treatment. Nasal washings and aspirates are unacceptable for Xpert Xpress SARS-CoV-2/FLU/RSV testing.  Fact Sheet for Patients: BloggerCourse.com  Fact Sheet for Healthcare Providers: SeriousBroker.it  This test is not yet approved or cleared by the [place] FDA and has been authorized for detection and/or diagnosis of SARS-CoV-2 by FDA under an Emergency Use Authorization (EUA). This EUA will remain in effect (meaning this test can be used) for the duration of the COVID-19 declaration under Section 564(b)(1) of the Act, 21 U.S.C. section 360bbb-3(b)(1), unless the authorization is terminated or revoked.     Resp Syncytial Virus by PCR NEGATIVE NEGATIVE    Comment: (NOTE) Fact Sheet for Patients: BloggerCourse.com  Fact Sheet for Healthcare Providers: SeriousBroker.it  This test is not yet approved or cleared by the [place] FDA and has been authorized for detection and/or diagnosis of SARS-CoV-2 by FDA under an Emergency Use Authorization (EUA). This EUA will remain in effect (meaning this test can be used) for the duration of the COVID-19 declaration under Section 564(b)(1) of the Act, 21 U.S.C. section 360bbb-3(b)(1), unless the authorization is terminated or revoked.  Performed at [facility], [address] [place], [place] [address]    Lactic acid, plasma     Status: Abnormal   Collection Time: 02/02/23  6:06 PM  Result Value Ref Range   Lactic Acid, Venous 3.5 (HH) 0.5 - 1.9 mmol/L    Comment: CRITICAL RESULT CALLED TO, READ BACK BY AND VERIFIED WITH Charna Elizabeth, RN 1907 02/02/23 CMR Performed at [facility], [address] [place], [place] [address]   Comprehensive metabolic panel     Status: Abnormal   Collection Time: 02/02/23  6:06 PM  Result Value Ref Range   Sodium 134 (L) 135 - 145 mmol/L   Potassium 3.6 3.5 - 5.1 mmol/L   Chloride 97 (L) 98 - 111 mmol/L   CO2 22 22 - 32 mmol/L   Glucose, Bld 235 (H) 70 - 99 mg/dL    Comment: Glucose reference range applies only to samples taken after fasting for at least 8 hours.   BUN 37 (H) 6 - 20 mg/dL   Creatinine, Ser 1.66 (H) 0.44 - 1.00 mg/dL   Calcium 9.3 8.9 - 06.3 mg/dL   Total Protein 7.1 6.5 - 8.1 g/dL   Albumin 3.5 3.5 - 5.0 g/dL   AST 15 15 - 41 U/L   ALT 13 0 - 44 U/L   Alkaline Phosphatase 131 (H) 38 - 126 U/L   Total Bilirubin 0.5 0.3 - 1.2 mg/dL   GFR, Estimated 14 (L) >60 mL/min    Comment: (NOTE) Calculated using  the CKD-EPI Creatinine Equation (2021)    Anion gap 15 5 - 15    Comment: Performed at [facility], [address] [place], [place] [address]  CBC with Differential     Status: Abnormal   Collection Time: 02/02/23  6:06 PM  Result Value Ref Range   WBC 18.2 (H) 4.0 - 10.5 K/uL   RBC 4.84 3.87 - 5.11 MIL/uL   Hemoglobin 13.3 12.0 - 15.0 g/dL   HCT 30.8 65.7 - 84.6 %   MCV 83.9 80.0 - 100.0 fL   MCH 27.5 26.0 - 34.0 pg   MCHC 32.8 30.0 - 36.0 g/dL   RDW 96.2 95.2 - 84.1 %   Platelets 390 150 - 400 K/uL   nRBC 0.0 0.0 - 0.2 %   Neutrophils Relative % 75 %   Neutro Abs 13.6 (H) 1.7 - 7.7 K/uL   Lymphocytes Relative 18 %   Lymphs Abs 3.3 0.7 - 4.0 K/uL   Monocytes Relative 6 %   Monocytes Absolute 1.1 (H) 0.1 - 1.0 K/uL   Eosinophils Relative 1 %   Eosinophils Absolute 0.1 0.0 - 0.5 K/uL   Basophils  Relative 0 %   Basophils Absolute 0.0 0.0 - 0.1 K/uL   Immature Granulocytes 0 %   Abs Immature Granulocytes 0.05 0.00 - 0.07 K/uL    Comment: Performed at [facility], [address] [place], [place] [address]  Protime-INR     Status: None   Collection Time: 02/02/23  6:06 PM  Result Value Ref Range   Prothrombin Time 13.6 11.4 - 15.2 seconds   INR 1.0 0.8 - 1.2    Comment: (NOTE) INR goal varies based on device and disease states. Performed at [facility], [address] [place], [place] [address]   APTT     Status: None   Collection Time: 02/02/23  6:06 PM  Result Value Ref Range   aPTT 27 24 - 36 seconds    Comment: Performed at [facility], [address] [place], [place] [address]  Occult blood card to lab, stool     Status: None   Collection Time: 02/02/23  6:20 PM  Result Value Ref Range   Fecal Occult Bld NEGATIVE NEGATIVE    Comment: Performed at [facility], [address] [place], [place] [address]  Lactic acid, plasma     Status: Abnormal   Collection Time: 02/02/23  7:59 PM  Result Value Ref Range   Lactic Acid, Venous 3.6 (HH) 0.5 - 1.9 mmol/L    Comment: CRITICAL RESULT CALLED TO, READ BACK BY AND VERIFIED WITH CALLED TO Albertha Ghee RN 2047 02/02/23 BY Bartolo Darter BOWMAN Performed at [facility], [address] [place], [place] [address]   Urinalysis, Routine w reflex microscopic -Urine, Clean Catch     Status: Abnormal   Collection Time: 02/02/23  8:02 PM  Result Value Ref Range   Color, Urine YELLOW YELLOW   APPearance CLOUDY (A) CLEAR   Specific Gravity, Urine >=1.030 1.005 - 1.030   pH 5.5 5.0 - 8.0   Glucose, UA 100 (A) NEGATIVE mg/dL   Hgb urine dipstick TRACE (A) NEGATIVE   Bilirubin Urine SMALL (A) NEGATIVE   Ketones, ur NEGATIVE NEGATIVE mg/dL   Protein, ur >=638 (A) NEGATIVE mg/dL   Nitrite NEGATIVE NEGATIVE   Leukocytes,Ua NEGATIVE NEGATIVE    Comment: Performed at [facility]  [address] [place], [place] [address]  Urinalysis, Microscopic (reflex)     Status: Abnormal   Collection Time: 02/02/23  8:02 PM  Result Value Ref Range   RBC / HPF 6-10 0 - 5 RBC/hpf   WBC, UA 0-5 0 - 5 WBC/hpf   Bacteria, UA MANY (A) NONE SEEN   Squamous Epithelial / HPF 6-10 0 - 5 /HPF    Comment: Performed at [facility], [address] [place], [place] [address]  Lactic acid, plasma     Status: Abnormal   Collection Time: 02/02/23  9:54 PM  Result Value Ref Range   Lactic Acid, Venous 2.2 (HH) 0.5 - 1.9 mmol/L    Comment: CRITICAL RESULT CALLED TO, READ BACK BY AND VERIFIED WITH Dionne Milo POWELL,RN AT 2219 02/02/23 CMR Performed at [facility], [address] [place], [place] [address]    CT CHEST ABDOMEN PELVIS WO CONTRAST  Result Date: 02/02/2023 CLINICAL DATA:  Sepsis EXAM: CT CHEST, ABDOMEN AND PELVIS WITHOUT CONTRAST TECHNIQUE: Multidetector CT imaging of the chest, abdomen and pelvis was performed following the standard protocol without IV contrast. RADIATION DOSE REDUCTION: This exam was performed according to the departmental dose-optimization program which includes automated exposure control, adjustment of the mA and/or kV according to patient size and/or use of iterative reconstruction technique. COMPARISON:  None Available. FINDINGS: CT CHEST FINDINGS Cardiovascular: No significant vascular findings. Normal heart size. No pericardial effusion. Mediastinum/Nodes: No enlarged mediastinal, hilar, or axillary lymph nodes. Thyroid gland, trachea, and esophagus demonstrate no significant findings. Lungs/Pleura: Lungs are clear. No pleural effusion or pneumothorax. Musculoskeletal: No chest wall mass or suspicious bone lesions identified. CT ABDOMEN PELVIS FINDINGS Hepatobiliary: No focal liver abnormality is seen. Status post cholecystectomy. No biliary dilatation. Pancreas: Unremarkable Spleen: Unremarkable Adrenals/Urinary Tract: Adrenal  glands are unremarkable. Kidneys are normal, without renal calculi, focal lesion, or hydronephrosis. Bladder is unremarkable. Stomach/Bowel: The stomach is unremarkable. A focal area of mild small bowel-small bowel intussusception is seen within the mid abdomen at axial image # 66/2, likely transient and of little clinical significance. No evidence of obstruction. The cecum is hypermobile, located within the mid abdomen. No focal inflammatory change. No free intraperitoneal gas or fluid. The appendix is not clearly identified, however, no pericecal inflammatory changes seen. Vascular/Lymphatic: No significant vascular findings are present. No enlarged abdominal or pelvic lymph nodes. Reproductive: Status post hysterectomy. No adnexal masses. Other: No abdominal wall hernia Musculoskeletal: Degenerative changes are seen within the lumbar spine. No acute bone abnormality. IMPRESSION: 1. No acute intrathoracic or intra-abdominal pathology identified. No definite radiographic explanation for the patient's reported symptoms. 2. Status post cholecystectomy and hysterectomy. Electronically Signed   By: Helyn Numbers M.D.   On: 02/02/2023 21:05   DG Chest Port 1 View  Result Date: 02/02/2023 CLINICAL DATA:  Questionable sepsis, dizziness, nausea. EXAM: PORTABLE CHEST 1 VIEW COMPARISON:  Chest radiograph dated 12/09/2022. FINDINGS: The heart size and mediastinal contours are within normal limits. There is minimal left basilar atelectasis. The right lung is clear. There is no pleural effusion or pneumothorax. The visualized skeletal structures are unremarkable. IMPRESSION: Minimal left basilar atelectasis. Electronically Signed   By: Romona Curls M.D.   On: 02/02/2023 19:41    Pending Labs Unresulted Labs (From admission, onward)     Start     Ordered   02/02/23 2053  Lactic acid, plasma  STAT Now then every 2 hours,   R  02/02/23 2052   02/02/23 1906  Urine Culture  Once,   URGENT       Question:   Indication  Answer:  Sepsis   02/02/23 1905   02/02/23 1759  Blood Culture (routine x 2)  (Septic presentation on arrival (screening labs, nursing and treatment orders for obvious sepsis))  BLOOD CULTURE X 2,   STAT      02/02/23 1759            Vitals/Pain Today's Vitals   02/02/23 2245 02/02/23 2300 02/02/23 2315 02/02/23 2330  BP: 105/70 92/69 90/70  96/76  Pulse:  93  92  Resp: 16 17 15 15   Temp:      TempSrc:      SpO2:  94%  93%  PainSc:        Isolation Precautions No active isolations  Medications Medications  cefTRIAXone (ROCEPHIN) 2 g in sodium chloride 0.9 % 100 mL IVPB (0 g Intravenous Stopped 02/02/23 1843)  azithromycin (ZITHROMAX) 500 mg in sodium chloride 0.9 % 250 mL IVPB (0 mg Intravenous Stopped 02/02/23 1945)  0.9 %  sodium chloride infusion ( Intravenous New Bag/Given 02/02/23 2147)  vancomycin (VANCOCIN) IVPB 1000 mg/200 mL premix (1,000 mg Intravenous New Bag/Given 02/02/23 2332)    Followed by  vancomycin (VANCOCIN) IVPB 1000 mg/200 mL premix (has no administration in time range)  sodium chloride 0.9 % bolus 1,000 mL (0 mLs Intravenous Stopped 02/02/23 1840)    And  sodium chloride 0.9 % bolus 1,000 mL (0 mLs Intravenous Stopped 02/02/23 2021)    And  sodium chloride 0.9 % bolus 1,000 mL (0 mLs Intravenous Stopped 02/02/23 2008)  sodium chloride 0.9 % bolus 1,000 mL (0 mLs Intravenous Stopped 02/02/23 2159)  hydrocortisone sodium succinate (SOLU-CORTEF) 100 MG injection 100 mg (100 mg Intravenous Given 02/02/23 2145)    Mobility walks     Focused Assessments    R Recommendations: See Admitting Provider Note  Report given to:   Additional Notes:

## 2023-02-02 NOTE — ED Provider Notes (Signed)
[place] [facility] Provider Note   CSN: [id number] Arrival date & time: 02/02/23  1718     History  Chief Complaint  Patient presents with   Dizziness    Lauren Soto is a 52 y.o. female.  Patient here with lightheadedness, nausea, general weakness for the last few days.  Was evaluated at an emergency department a few days ago.  She says she has had some diarrhea yesterday that looked dark.  She not on any blood thinners.  Felt worse today.  Denies any fever chills or pain with urination or abdominal pain.  No headache cough or sputum production.  No new medications.  The history is provided by the patient.       Home Medications Prior to Admission medications   Medication Sig Start Date End Date Taking? Authorizing Provider  atorvastatin (LIPITOR) 20 MG tablet Take 1 tablet (20 mg total) by mouth daily. 12/02/22   Clayborne Dana, NP  Cholecalciferol (VITAMIN D) 50 MCG (2000 UT) CAPS Take 1 capsule (2,000 Units total) by mouth daily. 12/20/22   Clayborne Dana, NP  cyclobenzaprine (FLEXERIL) 10 MG tablet Take 1 tablet (10 mg total) by mouth 2 (two) times daily as needed for muscle spasms. 10/01/20   Lorelee New, PA-C  diclofenac Sodium (VOLTAREN) 1 % GEL Apply topically 4 (four) times daily.    [provider]  fluticasone (FLONASE) 50 MCG/ACT nasal spray Place 2 sprays into both nostrils daily. 01/27/23   Clayborne Dana, NP  hydrochlorothiazide (HYDRODIURIL) 25 MG tablet Take 1 tablet (25 mg total) by mouth daily. 12/02/22   Clayborne Dana, NP  lisinopril (ZESTRIL) 40 MG tablet Take 1 tablet (40 mg total) by mouth daily. 12/02/22   Clayborne Dana, NP  meclizine (ANTIVERT) 25 MG tablet Take 1 tablet (25 mg total) by mouth 3 (three) times daily as needed for dizziness. 01/29/22   Cecil Cobbs, PA-C  meloxicam (MOBIC) 15 MG tablet Take 1 tablet (15 mg total) by mouth daily as needed. 12/02/22   Clayborne Dana, NP  metFORMIN  (GLUCOPHAGE) 850 MG tablet Take 1 tablet (850 mg total) by mouth 2 (two) times daily. 12/02/22   Clayborne Dana, NP  metoprolol succinate (TOPROL-XL) 50 MG 24 hr tablet Take 1 tablet (50 mg total) by mouth daily. Take with or immediately following a meal 01/30/23   Clayborne Dana, NP  omeprazole (PRILOSEC) 20 MG capsule Take 1 capsule (20 mg total) by mouth every morning. 12/02/22   Clayborne Dana, NP  ondansetron (ZOFRAN) 8 MG tablet Take by mouth every 8 (eight) hours as needed for nausea or vomiting.    [provider]      Allergies    Patient has no known allergies.    Review of Systems   Review of Systems  Physical Exam Updated Vital Signs BP 108/68   Pulse 97   Temp 100 F (37.8 C) (Rectal)   Resp 15   LMP 06/04/2012   SpO2 97%  Physical Exam Vitals and nursing note reviewed.  Constitutional:      General: She is not in acute distress.    Appearance: She is well-developed. She is ill-appearing.  HENT:     Head: Normocephalic and atraumatic.     Nose: Nose normal.     Mouth/Throat:     Mouth: Mucous membranes are dry.  Eyes:     Extraocular Movements: Extraocular movements intact.  Conjunctiva/sclera: Conjunctivae normal.     Pupils: Pupils are equal, round, and reactive to light.  Cardiovascular:     Rate and Rhythm: Normal rate and regular rhythm.     Pulses: Normal pulses.     Heart sounds: Normal heart sounds. No murmur heard. Pulmonary:     Effort: Pulmonary effort is normal. No respiratory distress.     Breath sounds: Normal breath sounds.  Abdominal:     Palpations: Abdomen is soft.     Tenderness: There is no abdominal tenderness.  Genitourinary:    Comments: Not much stool in the rectal vault Musculoskeletal:        General: No swelling.     Cervical back: Normal range of motion and neck supple.  Skin:    General: Skin is warm and dry.     Capillary Refill: Capillary refill takes less than 2 seconds.  Neurological:     General: No focal  deficit present.     Mental Status: She is alert.     Cranial Nerves: No cranial nerve deficit.     Sensory: No sensory deficit.     Motor: No weakness.     Coordination: Coordination normal.  Psychiatric:        Mood and Affect: Mood normal.     ED Results / Procedures / Treatments   Labs (all labs ordered are listed, but only abnormal results are displayed) Labs Reviewed  LACTIC ACID, PLASMA - Abnormal; Notable for the following components:      Result Value   Lactic Acid, Venous 3.5 (*)    All other components within normal limits  LACTIC ACID, PLASMA - Abnormal; Notable for the following components:   Lactic Acid, Venous 3.6 (*)    All other components within normal limits  COMPREHENSIVE METABOLIC PANEL - Abnormal; Notable for the following components:   Sodium 134 (*)    Chloride 97 (*)    Glucose, Bld 235 (*)    BUN 37 (*)    Creatinine, Ser 3.70 (*)    Alkaline Phosphatase 131 (*)    GFR, Estimated 14 (*)    All other components within normal limits  CBC WITH DIFFERENTIAL/PLATELET - Abnormal; Notable for the following components:   WBC 18.2 (*)    Neutro Abs 13.6 (*)    Monocytes Absolute 1.1 (*)    All other components within normal limits  URINALYSIS, ROUTINE W REFLEX MICROSCOPIC - Abnormal; Notable for the following components:   APPearance CLOUDY (*)    Glucose, UA 100 (*)    Hgb urine dipstick TRACE (*)    Bilirubin Urine SMALL (*)    Protein, ur >=300 (*)    All other components within normal limits  URINALYSIS, MICROSCOPIC (REFLEX) - Abnormal; Notable for the following components:   Bacteria, UA MANY (*)    All other components within normal limits  RESP PANEL BY RT-PCR (RSV, FLU A&B, COVID)  RVPGX2  CULTURE, BLOOD (ROUTINE X 2)  CULTURE, BLOOD (ROUTINE X 2)  URINE CULTURE  PROTIME-INR  APTT  OCCULT BLOOD X 1 CARD TO LAB, STOOL  LACTIC ACID, PLASMA  LACTIC ACID, PLASMA    EKG EKG Interpretation Date/Time:  Sunday February 02 2023 17:48:58  EDT Ventricular Rate:  101 PR Interval:  130 QRS Duration:  82 QT Interval:  369 QTC Calculation: 479 R Axis:   68  Text Interpretation: Sinus tachycardia Ventricular premature complex Consider right atrial enlargement Confirmed by Virgina Norfolk [phone]) on 02/02/2023 6:01:20 PM  Radiology CT CHEST ABDOMEN PELVIS WO CONTRAST  Result Date: 02/02/2023 CLINICAL DATA:  Sepsis EXAM: CT CHEST, ABDOMEN AND PELVIS WITHOUT CONTRAST TECHNIQUE: Multidetector CT imaging of the chest, abdomen and pelvis was performed following the standard protocol without IV contrast. RADIATION DOSE REDUCTION: This exam was performed according to the departmental dose-optimization program which includes automated exposure control, adjustment of the mA and/or kV according to patient size and/or use of iterative reconstruction technique. COMPARISON:  None Available. FINDINGS: CT CHEST FINDINGS Cardiovascular: No significant vascular findings. Normal heart size. No pericardial effusion. Mediastinum/Nodes: No enlarged mediastinal, hilar, or axillary lymph nodes. Thyroid gland, trachea, and esophagus demonstrate no significant findings. Lungs/Pleura: Lungs are clear. No pleural effusion or pneumothorax. Musculoskeletal: No chest wall mass or suspicious bone lesions identified. CT ABDOMEN PELVIS FINDINGS Hepatobiliary: No focal liver abnormality is seen. Status post cholecystectomy. No biliary dilatation. Pancreas: Unremarkable Spleen: Unremarkable Adrenals/Urinary Tract: Adrenal glands are unremarkable. Kidneys are normal, without renal calculi, focal lesion, or hydronephrosis. Bladder is unremarkable. Stomach/Bowel: The stomach is unremarkable. A focal area of mild small bowel-small bowel intussusception is seen within the mid abdomen at axial image # 66/2, likely transient and of little clinical significance. No evidence of obstruction. The cecum is hypermobile, located within the mid abdomen. No focal inflammatory change. No free  intraperitoneal gas or fluid. The appendix is not clearly identified, however, no pericecal inflammatory changes seen. Vascular/Lymphatic: No significant vascular findings are present. No enlarged abdominal or pelvic lymph nodes. Reproductive: Status post hysterectomy. No adnexal masses. Other: No abdominal wall hernia Musculoskeletal: Degenerative changes are seen within the lumbar spine. No acute bone abnormality. IMPRESSION: 1. No acute intrathoracic or intra-abdominal pathology identified. No definite radiographic explanation for the patient's reported symptoms. 2. Status post cholecystectomy and hysterectomy. Electronically Signed   By: Helyn Numbers M.D.   On: 02/02/2023 21:05   DG Chest Port 1 View  Result Date: 02/02/2023 CLINICAL DATA:  Questionable sepsis, dizziness, nausea. EXAM: PORTABLE CHEST 1 VIEW COMPARISON:  Chest radiograph dated 12/09/2022. FINDINGS: The heart size and mediastinal contours are within normal limits. There is minimal left basilar atelectasis. The right lung is clear. There is no pleural effusion or pneumothorax. The visualized skeletal structures are unremarkable. IMPRESSION: Minimal left basilar atelectasis. Electronically Signed   By: Romona Curls M.D.   On: 02/02/2023 19:41    Procedures .Critical Care  Performed by: Virgina Norfolk, DO Authorized by: Virgina Norfolk, DO   Critical care provider statement:    Critical care time (minutes):  40   Critical care was necessary to treat or prevent imminent or life-threatening deterioration of the following conditions:  Sepsis and circulatory failure   Critical care was time spent personally by me on the following activities:  Blood draw for specimens, development of treatment plan with patient or surrogate, discussions with primary provider, evaluation of patient's response to treatment, examination of patient, obtaining history from patient or surrogate, ordering and performing treatments and interventions, ordering and  review of laboratory studies, ordering and review of radiographic studies, pulse oximetry, re-evaluation of patient's condition and review of old charts   Care discussed with: admitting provider       Medications Ordered in ED Medications  cefTRIAXone (ROCEPHIN) 2 g in sodium chloride 0.9 % 100 mL IVPB (0 g Intravenous Stopped 02/02/23 1843)  azithromycin (ZITHROMAX) 500 mg in sodium chloride 0.9 % 250 mL IVPB (0 mg Intravenous Stopped 02/02/23 1945)  0.9 %  sodium chloride infusion (has no administration in time range)  hydrocortisone sodium succinate (SOLU-CORTEF) 100 MG injection 100 mg (has no administration in time range)  sodium chloride 0.9 % bolus 1,000 mL (0 mLs Intravenous Stopped 02/02/23 1840)    And  sodium chloride 0.9 % bolus 1,000 mL (0 mLs Intravenous Stopped 02/02/23 2021)    And  sodium chloride 0.9 % bolus 1,000 mL (1,000 mLs Intravenous New Bag/Given 02/02/23 1938)  sodium chloride 0.9 % bolus 1,000 mL (1,000 mLs Intravenous New Bag/Given 02/02/23 2025)    ED Course/ Medical Decision Making/ A&P                                 Medical Decision Making Amount and/or Complexity of Data Reviewed Labs: ordered. Radiology: ordered. ECG/medicine tests: ordered.  Risk Prescription drug management. Decision regarding hospitalization.   Lauren Soto is here with generalized weakness.  She arrives hypotensive with blood pressure 74/53 confirmed with manual blood pressure.  Rectal temperature is 100.  She is tachycardic to 107.  EKG shows sinus tachycardia.  She has had some generalized fatigue and weakness lightheadedness the last few days.  Per my review of her chart she had an extensive workup done at [facility] a few days ago that was unremarkable.  She has not noticed fever at home.  She had some loose stools yesterday that looked dark.  She does not appear to have much stool on rectal exam and really hard to get a good Hemoccult.  Differential diagnosis could  be sepsis versus GI bleed versus dehydration.  Will pursue workup with blood cultures, lactic acid, broad-spectrum IV antibiotics.  She does not endorse any pain with urination or cough or sputum production.  No abdominal pain.  Abdominal exam is unremarkable.  Per my review interpretation of labs creatinine elevated 3.7, lactic acid 3.5, leukocytosis of 18.  COVID and flu test negative.  Chest x-ray does not appear consistent with pneumonia.  Will get a CT of the chest abdomen pelvis to further look for infectious process.  Still awaiting urinalysis.  Blood pressure has improved following first 2 L of IV fluids.  She is written for 30 cc/kg IV fluids but I will give her 4 L total and reevaluate the need for possible pressors.  Overall after 4 L of IV fluids, blood pressure has started to stabilize.  Last blood pressure 108/86.  She is feeling much better.  Still has not had quite urine output yet.  She has been started on maintenance fluids.  CT scan of the chest abdomen and pelvis did not show any source for infection.  Urinalysis has many bacteria but no nitrites or leukocytes.  She does not really have reason to have occult bacteremia.  I gave her a stress dose of Solu-Cortef.  Will talk to the hospitalist about admission.  Will discuss if we need to broaden out antibiotics at this time.  This chart was dictated using voice recognition software.  Despite best efforts to proofread,  errors can occur which can change the documentation meaning.         Final Clinical Impression(s) / ED Diagnoses Final diagnoses:  Dehydration  Sepsis, due to unspecified organism, unspecified whether acute organ dysfunction present [facility])  AKI (acute kidney injury) [facility])    Rx / DC Orders ED Discharge Orders     None         Virgina Norfolk, DO 02/02/23 2132

## 2023-02-02 NOTE — ED Notes (Signed)
Primary RN made aware of lactic acid 3.6.

## 2023-02-02 NOTE — ED Notes (Signed)
Pt reports that she feels better and asking about admission and when she will get tx. Let pt know there is no admission order yet, but I will update her and her family with any new information. Pt aware she has pending LA result and pending CT abd/pelvis. Pt aggreable. No other concerns at this time.

## 2023-02-02 NOTE — Progress Notes (Signed)
Plan of Care Note for accepted transfer   Patient: Lauren Soto MRN: [id number]   DOA: 02/02/2023  Facility requesting transfer: [facility] Requesting Provider: Dr. Lockie Mola Reason for transfer: AKI, dehydration, SIRS/possible sepsis Facility course: 52 year old female with history of type 2 diabetes, hypertension, hyperlipidemia, vertigo, GERD presented to ED with complaints of generalized weakness, lightheadedness, and dark-colored loose stools/diarrhea.  Hypotensive on arrival with blood pressure 74/53 and rectal temperature 100 F.  Tachycardic to 107.  EKG showing sinus tachycardia.  Labs showing WBC 18.2, hemoglobin 13.3, FOBT negative, sodium 134, glucose 235, BUN 37, creatinine 3.7 (baseline 0.7), alk phos 131 (chronically elevated) and remainder of LFTs normal, COVID/influenza/RSV PCR negative, lactic acid 3.5> 3.6, blood cultures collected.  UA with negative nitrite, negative leukocytes, and microscopy showing 0-5 WBCs and many bacteria.  Urine culture pending.  CT chest/abdomen/pelvis without contrast negative for acute findings. Patient was given Solu-Cortef 100 mg, ceftriaxone, azithromycin, and 4 L normal saline boluses.  EDP will also add vancomycin to broaden antibiotic coverage since source of infection unknown.  Blood pressure improved after fluid boluses, SBP now >100.  Repeat lactate 2.2.  Plan of care: The patient is accepted for admission to Progressive unit, at [facility].  [facility] will assume care on arrival to accepting facility. Until arrival, care as per EDP. However, [facility] available 24/7 for questions and assistance.  Author: John Giovanni, MD 02/02/2023  Check [email or web address] for on-call coverage.  Nursing staff, Please call [facility] Admits & Consults System-Wide number on Amion as soon as patient's arrival, so appropriate admitting provider can evaluate the pt.

## 2023-02-03 ENCOUNTER — Inpatient Hospital Stay (HOSPITAL_COMMUNITY): Payer: Medicaid Other

## 2023-02-03 ENCOUNTER — Encounter (HOSPITAL_COMMUNITY): Payer: Self-pay | Admitting: Internal Medicine

## 2023-02-03 DIAGNOSIS — J3489 Other specified disorders of nose and nasal sinuses: Secondary | ICD-10-CM | POA: Diagnosis not present

## 2023-02-03 DIAGNOSIS — R197 Diarrhea, unspecified: Secondary | ICD-10-CM | POA: Diagnosis not present

## 2023-02-03 DIAGNOSIS — Z79899 Other long term (current) drug therapy: Secondary | ICD-10-CM | POA: Diagnosis not present

## 2023-02-03 DIAGNOSIS — N179 Acute kidney failure, unspecified: Secondary | ICD-10-CM | POA: Diagnosis not present

## 2023-02-03 DIAGNOSIS — K219 Gastro-esophageal reflux disease without esophagitis: Secondary | ICD-10-CM | POA: Insufficient documentation

## 2023-02-03 DIAGNOSIS — Z9049 Acquired absence of other specified parts of digestive tract: Secondary | ICD-10-CM | POA: Diagnosis not present

## 2023-02-03 DIAGNOSIS — R55 Syncope and collapse: Secondary | ICD-10-CM

## 2023-02-03 DIAGNOSIS — Z8249 Family history of ischemic heart disease and other diseases of the circulatory system: Secondary | ICD-10-CM | POA: Diagnosis not present

## 2023-02-03 DIAGNOSIS — I10 Essential (primary) hypertension: Secondary | ICD-10-CM | POA: Diagnosis not present

## 2023-02-03 DIAGNOSIS — E119 Type 2 diabetes mellitus without complications: Secondary | ICD-10-CM

## 2023-02-03 DIAGNOSIS — R6520 Severe sepsis without septic shock: Secondary | ICD-10-CM | POA: Diagnosis not present

## 2023-02-03 DIAGNOSIS — N3000 Acute cystitis without hematuria: Secondary | ICD-10-CM | POA: Diagnosis not present

## 2023-02-03 DIAGNOSIS — R110 Nausea: Secondary | ICD-10-CM | POA: Diagnosis not present

## 2023-02-03 DIAGNOSIS — Z87891 Personal history of nicotine dependence: Secondary | ICD-10-CM | POA: Diagnosis not present

## 2023-02-03 DIAGNOSIS — A419 Sepsis, unspecified organism: Secondary | ICD-10-CM

## 2023-02-03 DIAGNOSIS — J019 Acute sinusitis, unspecified: Secondary | ICD-10-CM | POA: Diagnosis not present

## 2023-02-03 DIAGNOSIS — Z90710 Acquired absence of both cervix and uterus: Secondary | ICD-10-CM | POA: Diagnosis not present

## 2023-02-03 DIAGNOSIS — Z791 Long term (current) use of non-steroidal anti-inflammatories (NSAID): Secondary | ICD-10-CM | POA: Diagnosis not present

## 2023-02-03 DIAGNOSIS — Z9079 Acquired absence of other genital organ(s): Secondary | ICD-10-CM | POA: Diagnosis not present

## 2023-02-03 DIAGNOSIS — R42 Dizziness and giddiness: Secondary | ICD-10-CM

## 2023-02-03 DIAGNOSIS — H9312 Tinnitus, left ear: Secondary | ICD-10-CM | POA: Diagnosis not present

## 2023-02-03 DIAGNOSIS — E785 Hyperlipidemia, unspecified: Secondary | ICD-10-CM | POA: Diagnosis not present

## 2023-02-03 DIAGNOSIS — E669 Obesity, unspecified: Secondary | ICD-10-CM | POA: Diagnosis not present

## 2023-02-03 DIAGNOSIS — Z1152 Encounter for screening for COVID-19: Secondary | ICD-10-CM | POA: Diagnosis not present

## 2023-02-03 DIAGNOSIS — Z79890 Hormone replacement therapy (postmenopausal): Secondary | ICD-10-CM | POA: Diagnosis not present

## 2023-02-03 DIAGNOSIS — E860 Dehydration: Secondary | ICD-10-CM | POA: Diagnosis not present

## 2023-02-03 DIAGNOSIS — Z7984 Long term (current) use of oral hypoglycemic drugs: Secondary | ICD-10-CM | POA: Diagnosis not present

## 2023-02-03 DIAGNOSIS — J9811 Atelectasis: Secondary | ICD-10-CM | POA: Diagnosis not present

## 2023-02-03 DIAGNOSIS — E872 Acidosis, unspecified: Secondary | ICD-10-CM | POA: Diagnosis not present

## 2023-02-03 DIAGNOSIS — H9202 Otalgia, left ear: Secondary | ICD-10-CM | POA: Diagnosis not present

## 2023-02-03 HISTORY — DX: Sepsis, unspecified organism: A41.9

## 2023-02-03 HISTORY — DX: Dizziness and giddiness: R42

## 2023-02-03 HISTORY — DX: Sepsis, unspecified organism: R65.20

## 2023-02-03 HISTORY — DX: Acute cystitis without hematuria: N30.00

## 2023-02-03 HISTORY — DX: Gastro-esophageal reflux disease without esophagitis: K21.9

## 2023-02-03 LAB — COMPREHENSIVE METABOLIC PANEL
ALT: 14 U/L (ref 0–44)
AST: 13 U/L — ABNORMAL LOW (ref 15–41)
Albumin: 2.9 g/dL — ABNORMAL LOW (ref 3.5–5.0)
Alkaline Phosphatase: 101 U/L (ref 38–126)
Anion gap: 10 (ref 5–15)
BUN: 32 mg/dL — ABNORMAL HIGH (ref 6–20)
CO2: 17 mmol/L — ABNORMAL LOW (ref 22–32)
Calcium: 7.8 mg/dL — ABNORMAL LOW (ref 8.9–10.3)
Chloride: 108 mmol/L (ref 98–111)
Creatinine, Ser: 2.33 mg/dL — ABNORMAL HIGH (ref 0.44–1.00)
GFR, Estimated: 25 mL/min — ABNORMAL LOW (ref >60)
Glucose, Bld: 195 mg/dL — ABNORMAL HIGH (ref 70–99)
Potassium: 4 mmol/L (ref 3.5–5.1)
Sodium: 135 mmol/L (ref 135–145)
Total Bilirubin: 0.4 mg/dL (ref 0.3–1.2)
Total Protein: 5.9 g/dL — ABNORMAL LOW (ref 6.5–8.1)

## 2023-02-03 LAB — CBC
HCT: 35.2 % — ABNORMAL LOW (ref 36.0–46.0)
Hemoglobin: 10.9 g/dL — ABNORMAL LOW (ref 12.0–15.0)
MCH: 27.7 pg (ref 26.0–34.0)
MCHC: 31 g/dL (ref 30.0–36.0)
MCV: 89.3 fL (ref 80.0–100.0)
Platelets: 279 10*3/uL (ref 150–400)
RBC: 3.94 MIL/uL (ref 3.87–5.11)
RDW: 15.7 % — ABNORMAL HIGH (ref 11.5–15.5)
WBC: 13.4 10*3/uL — ABNORMAL HIGH (ref 4.0–10.5)
nRBC: 0 % (ref 0.0–0.2)

## 2023-02-03 LAB — GLUCOSE, CAPILLARY
Glucose-Capillary: 234 mg/dL — ABNORMAL HIGH (ref 70–99)
Glucose-Capillary: 135 mg/dL — ABNORMAL HIGH (ref 70–99)
Glucose-Capillary: 98 mg/dL (ref 70–99)
Glucose-Capillary: 149 mg/dL — ABNORMAL HIGH (ref 70–99)
Glucose-Capillary: 190 mg/dL — ABNORMAL HIGH (ref 70–99)

## 2023-02-03 LAB — ECHOCARDIOGRAM COMPLETE
Area-P 1/2: 4.17 cm2
Height: 60 in
S' Lateral: 2.3 cm
Weight: 3224.01 oz

## 2023-02-03 LAB — HEMOGLOBIN A1C
Hgb A1c MFr Bld: 7.3 % — ABNORMAL HIGH (ref 4.8–5.6)
Mean Plasma Glucose: 162.81 mg/dL

## 2023-02-03 LAB — LACTIC ACID, PLASMA
Lactic Acid, Venous: 2 mmol/L (ref 0.5–1.9)
Lactic Acid, Venous: 1.4 mmol/L (ref 0.5–1.9)

## 2023-02-03 MED ORDER — PERFLUTREN LIPID MICROSPHERE
1.0000 mL | INTRAVENOUS | Status: AC | PRN
  Administered 2023-02-03: 5 mL via INTRAVENOUS

## 2023-02-03 MED ORDER — ONDANSETRON HCL 4 MG PO TABS: 4.0000 mg | ORAL_TABLET | Freq: Four times a day (QID) | ORAL | Status: DC | PRN

## 2023-02-03 MED ORDER — SODIUM CHLORIDE 0.9 % IV SOLN: 250.0000 mL | INTRAVENOUS | Status: DC | PRN

## 2023-02-03 MED ORDER — ACETAMINOPHEN 325 MG PO TABS: 650.0000 mg | ORAL_TABLET | Freq: Four times a day (QID) | ORAL | Status: DC | PRN

## 2023-02-03 MED ORDER — SODIUM CHLORIDE 0.9% FLUSH: 3.0000 mL | INTRAVENOUS | Status: DC | PRN

## 2023-02-03 MED ORDER — ONDANSETRON HCL 4 MG/2ML IJ SOLN: 4.0000 mg | Freq: Four times a day (QID) | INTRAMUSCULAR | Status: DC | PRN

## 2023-02-03 MED ORDER — PANTOPRAZOLE SODIUM 40 MG PO TBEC
40.0000 mg | DELAYED_RELEASE_TABLET | Freq: Every day | ORAL | Status: DC
  Administered 2023-02-03 – 2023-02-04 (×2): 40 mg via ORAL
  Filled 2023-02-03 (×2): qty 1

## 2023-02-03 MED ORDER — SODIUM CHLORIDE 0.9 % IV SOLN
2.0000 g | INTRAVENOUS | Status: DC
  Administered 2023-02-03 – 2023-02-04 (×2): 2 g via INTRAVENOUS
  Filled 2023-02-03 (×2): qty 12.5

## 2023-02-03 MED ORDER — ACETAMINOPHEN 650 MG RE SUPP: 650.0000 mg | Freq: Four times a day (QID) | RECTAL | Status: DC | PRN

## 2023-02-03 MED ORDER — VANCOMYCIN HCL 500 MG/100ML IV SOLN
500.0000 mg | INTRAVENOUS | Status: DC
  Administered 2023-02-03: 500 mg via INTRAVENOUS
  Filled 2023-02-03: qty 100

## 2023-02-03 MED ORDER — SODIUM CHLORIDE 0.9 % IV SOLN: INTRAVENOUS | Status: AC

## 2023-02-03 MED ORDER — ATORVASTATIN CALCIUM 20 MG PO TABS
20.0000 mg | ORAL_TABLET | Freq: Every day | ORAL | Status: DC
  Administered 2023-02-03 – 2023-02-04 (×2): 20 mg via ORAL
  Filled 2023-02-03 (×2): qty 1

## 2023-02-03 MED ORDER — HEPARIN SODIUM (PORCINE) 5000 UNIT/ML IJ SOLN
5000.0000 [IU] | Freq: Three times a day (TID) | INTRAMUSCULAR | Status: DC
  Administered 2023-02-03 – 2023-02-04 (×5): 5000 [IU] via SUBCUTANEOUS
  Filled 2023-02-03 (×5): qty 1

## 2023-02-03 MED ORDER — MECLIZINE HCL 25 MG PO TABS: 25.0000 mg | ORAL_TABLET | Freq: Three times a day (TID) | ORAL | Status: DC | PRN

## 2023-02-03 MED ORDER — SODIUM CHLORIDE 0.9% FLUSH
3.0000 mL | Freq: Two times a day (BID) | INTRAVENOUS | Status: DC
  Administered 2023-02-03 – 2023-02-04 (×3): 3 mL via INTRAVENOUS

## 2023-02-03 MED ORDER — VANCOMYCIN HCL 750 MG/150ML IV SOLN: 750.0000 mg | INTRAVENOUS | Status: DC

## 2023-02-03 MED ORDER — INSULIN ASPART 100 UNIT/ML IJ SOLN: 0.0000 [IU] | Freq: Three times a day (TID) | INTRAMUSCULAR | Status: DC

## 2023-02-03 NOTE — Plan of Care (Signed)
  Problem: Coping: Goal: Level of anxiety will decrease Outcome: Progressing   Problem: Elimination: Goal: Will not experience complications related to bowel motility Outcome: Progressing Goal: Will not experience complications related to urinary retention Outcome: Progressing   Problem: Pain Managment: Goal: General experience of comfort will improve Outcome: Progressing   

## 2023-02-03 NOTE — Plan of Care (Signed)
  Problem: Education: Goal: Knowledge of General Education information will improve Description: Including pain rating scale, medication(s)/side effects and non-pharmacologic comfort measures Outcome: Progressing   Problem: Clinical Measurements: Goal: Respiratory complications will improve Outcome: Progressing Goal: Cardiovascular complication will be avoided Outcome: Progressing   Problem: Nutrition: Goal: Adequate nutrition will be maintained Outcome: Progressing   Problem: Coping: Goal: Level of anxiety will decrease Outcome: Progressing   Problem: Elimination: Goal: Will not experience complications related to bowel motility Outcome: Progressing Goal: Will not experience complications related to urinary retention Outcome: Progressing   Problem: Pain Managment: Goal: General experience of comfort will improve Outcome: Progressing   Problem: Safety: Goal: Ability to remain free from injury will improve Outcome: Progressing   Problem: Skin Integrity: Goal: Risk for impaired skin integrity will decrease Outcome: Progressing   Problem: Fluid Volume: Goal: Ability to maintain a balanced intake and output will improve Outcome: Progressing   Problem: Metabolic: Goal: Ability to maintain appropriate glucose levels will improve Outcome: Progressing   Problem: Nutritional: Goal: Maintenance of adequate nutrition will improve Outcome: Progressing

## 2023-02-03 NOTE — Progress Notes (Signed)
Transition of Care [facility]) - Inpatient Brief Assessment   Patient Details  Name: Lauren Soto MRN: [id number] Date of Birth: 11-18-1970  Transition of Care [facility]) CM/SW Contact:    Larrie Kass, LCSW Phone Number: 02/03/2023, 1:58 PM   Clinical Narrative:  Transition of Care Department [facility]) has reviewed patient and no TOC needs have been identified at this time. We will continue to monitor patient advancement through interdisciplinary progression rounds. If new patient transition needs arise, please place a TOC consult.  Transition of Care Asessment: Insurance and Status: Insurance coverage has been reviewed Patient has primary care physician: Yes Home environment has been reviewed: yes   Prior/Current Home Services: No current home services Social Determinants of Health Reivew: SDOH reviewed no interventions necessary Readmission risk has been reviewed: Yes Transition of care needs: no transition of care needs at this time

## 2023-02-03 NOTE — Progress Notes (Signed)
Pharmacy Antibiotic Note  Lauren Soto is a 52 y.o. female admitted on 02/02/2023 with sepsis.  Patient received Vancomycin 2gm IV, Ceftriaxone 2gm IV and Azithromycin 500mg  IV x 1 dose each in the ED.  Pharmacy has been consulted for Vancomycin and Cefepime dosing.  Plan: Vancomycin 750 mg IV Q 48 hrs. Goal AUC 400-550.  Expected AUC: 542.6  SCr used: 3.7 Cefepime 2gm IV q24h Closely follow SCr  F/u culture results and sensitivities   Height: 5' (152.4 cm) Weight: 91.4 kg (201 lb 8 oz) IBW/kg (Calculated) : 45.5  Temp (24hrs), Avg:98.9 F (37.2 C), Min:97.7 F (36.5 C), Max:100 F (37.8 C)  Recent Labs  Lab 01/27/23 1415 02/02/23 1806 02/02/23 1959 02/02/23 2154 02/02/23 2346  WBC  --  18.2*  --   --   --   CREATININE 0.78 3.70*  --   --   --   LATICACIDVEN  --  3.5* 3.6* 2.2* 2.4*    Estimated Creatinine Clearance: 18.1 mL/min (A) (by C-G formula based on SCr of 3.7 mg/dL (H)).    No Known Allergies  Antimicrobials this admission: 8/4 Ceftriaxone x 1 8/4 Azithromycin x 1 8/4 Vancomycin >> 8/5 Cefepime >>  Dose adjustments this admission:    Microbiology results: 8/5 BCx:   8/5 UCx:       Thank you for allowing pharmacy to be a part of this patient's care.  Maryellen Pile, PharmD 02/03/2023 2:45 AM

## 2023-02-03 NOTE — Progress Notes (Signed)
  Echocardiogram 2D Echocardiogram has been performed.  Leda Roys RDCS 02/03/2023, 12:40 PM

## 2023-02-03 NOTE — Progress Notes (Signed)
Pharmacy Antibiotic Note  Lauren Soto is a 52 y.o. female admitted on 02/02/2023 with sepsis.  Patient received Vancomycin 2gm IV, Ceftriaxone 2gm IV and Azithromycin 500mg  IV x 1 dose each in the ED.  Pharmacy has been consulted for Vancomycin and Cefepime dosing.  SCr has improved with AM labs from 3.70 to 2.33. Therefore will adjust vancomycin dose   Plan: Vancomycin 500 mg IV Q 24 hrs. Goal AUC 400-550.  Expected AUC: 510  SCr used: 2.33 Cefepime 2gm IV q24h Closely follow SCr  F/u culture results and sensitivities   Height: 5' (152.4 cm) Weight: 91.4 kg (201 lb 8 oz) IBW/kg (Calculated) : 45.5  Temp (24hrs), Avg:98.6 F (37 C), Min:97.7 F (36.5 C), Max:100 F (37.8 C)  Recent Labs  Lab 01/27/23 1415 02/02/23 1806 02/02/23 1959 02/02/23 2154 02/02/23 2346 02/03/23 0504  WBC  --  18.2*  --   --   --  13.4*  CREATININE 0.78 3.70*  --   --   --  2.33*  LATICACIDVEN  --  3.5* 3.6* 2.2* 2.4*  --     Estimated Creatinine Clearance: 28.8 mL/min (A) (by C-G formula based on SCr of 2.33 mg/dL (H)).    No Known Allergies  Antimicrobials this admission: 8/4 Ceftriaxone x 1 8/4 Azithromycin x 1 8/4 Vancomycin >> 8/5 Cefepime >>  Dose adjustments this admission:    Microbiology results: 8/5 BCx:   8/5 UCx:       Thank you for allowing pharmacy to be a part of this patient's care.  Adalberto Cole, PharmD, BCPS 02/03/2023 7:43 AM

## 2023-02-03 NOTE — H&P (Signed)
History and Physical    Lauren Soto WGN:[id number] DOB: [birth year] DOA: 02/02/2023  PCP: Pcp, No   Patient coming from: Home   Chief Complaint:  Chief Complaint  Patient presents with   Dizziness    HPI:  Lauren Soto is a 52 y.o. female with medical history significant of diabetes mellitus type 2, essential hypertension, hyperlipidemia, vertigo and GERD presented to emergency department with complaining of generalized weakness, lightheadedness and dark-colored stool/diarrhea.   Patient reporting she is not feeling well over the course of last 1 week.  On she has left-sided ear pain and sinus pain for last 7 to 10 days and she went to see primary care doctor who told her that she has pain inside her ear and prescribed some Flonase nasal spray.  However she continues to have left-sided ear pain with associated tinnitus/ringing sensation, left-sided sinus pain.  Due to left-sided ear tinnitus and ringing sensation sometimes she feels dizzy and imbalanced and she experienced near syncope few days ago.  Denies any associated nausea and palpitation. Patient is also experiencing pelvic pain/pressure/discomfort at the end of micturition.  Denies any urinary urgency and increased frequency.  No flank pain and hematuria. Patient denies any chest pain, palpitation, headache, neck pain, fever, chills, malaise, diaphoresis, development of rash, hearing loss, blurry vision, photophobia, ear pain, ear discharge, sinus pressure, sinus congestion, cough, sputum production, shortness of breath, nausea, vomiting.     ED Course:  On presentation to ED patient found hypotensive blood pressure 74/53 and rectal temperature 100 F.  Tachycardic 107.  EKG showed sinus tachycardia. Labs showing elevated WBC 18.2, hemoglobin 13.  0.3, hematocrit 40 and platelet 390. FOBT negative. CMP sodium 134, elevated glucose 235, elevated BUN 37, elevated creatinine 3.7 (baseline around 0.7), alkaline  phosphatase 131 (chronically elevated) and reminded unremarkable. Initial lactic acid 3.7 which improved to 2.4 with IV bolus and hydration.   UA-  cloudy appearance, 100 glucose, hemoglobin trace, bilirubin small, more than 300 protein and many bacteria. Pending urine culture.  Respiratory panel negative.  Imaging,  Chest x-ray minimal left bibasilar atelectasis. CT abdomen pelvis without contrast negative for any acute finding.  Status post cholecystectomy and hysterectomy.  In the ED patient received solu-Cortef 100 mg, ceftriaxone, azithromycin, and 4 L normal saline boluses.  EDP will also add vancomycin to broaden antibiotic coverage since source of infection unknown.  Blood pressure improved after fluid boluses, SBP now >100.  Repeat lactate 2.2.   Patient has been transferred to [facility] for further management and workup for sepsis.  Review of Systems:  Review of Systems  Constitutional:  Negative for chills, fever, malaise/fatigue and weight loss.  HENT:  Positive for ear pain, sinus pain and tinnitus. Negative for congestion, ear discharge and sore throat.   Eyes:  Negative for blurred vision, double vision, photophobia, pain, discharge and redness.  Respiratory:  Negative for cough, sputum production and shortness of breath.   Cardiovascular:  Negative for chest pain and palpitations.  Gastrointestinal:  Negative for abdominal pain, heartburn, nausea and vomiting.  Genitourinary:  Positive for urgency. Negative for dysuria, flank pain, frequency and hematuria.  Musculoskeletal:  Positive for falls. Negative for back pain, joint pain, myalgias and neck pain.  Skin:  Negative for itching and rash.  Neurological:  Positive for dizziness. Negative for tingling, tremors, speech change, focal weakness, loss of consciousness, weakness and headaches.  Psychiatric/Behavioral:  The patient is not nervous/anxious.     Past Medical History:  Diagnosis Date   Diabetes  mellitus without complication (HCC)    Hypertension    Vertigo     Past Surgical History:  Procedure Laterality Date   ABDOMINAL HYSTERECTOMY     BUNIONECTOMY     CHOLECYSTECTOMY     SHOULDER ARTHROSCOPY       reports that she quit smoking about 15 years ago. Her smoking use included cigarettes. She has never used smokeless tobacco. She reports that she does not drink alcohol and does not use drugs.  No Known Allergies  Family History  Problem Relation Age of Onset   Hypertension Mother    Multiple myeloma Mother    Arthritis Mother    Hypertension Father     Prior to Admission medications   Medication Sig Start Date End Date Taking? Authorizing Provider  atorvastatin (LIPITOR) 20 MG tablet Take 1 tablet (20 mg total) by mouth daily. 12/02/22   Clayborne Dana, NP  Cholecalciferol (VITAMIN D) 50 MCG (2000 UT) CAPS Take 1 capsule (2,000 Units total) by mouth daily. 12/20/22   Clayborne Dana, NP  cyclobenzaprine (FLEXERIL) 10 MG tablet Take 1 tablet (10 mg total) by mouth 2 (two) times daily as needed for muscle spasms. 10/01/20   Lorelee New, PA-C  diclofenac Sodium (VOLTAREN) 1 % GEL Apply topically 4 (four) times daily.    [provider]  fluticasone (FLONASE) 50 MCG/ACT nasal spray Place 2 sprays into both nostrils daily. 01/27/23   Clayborne Dana, NP  hydrochlorothiazide (HYDRODIURIL) 25 MG tablet Take 1 tablet (25 mg total) by mouth daily. 12/02/22   Clayborne Dana, NP  lisinopril (ZESTRIL) 40 MG tablet Take 1 tablet (40 mg total) by mouth daily. 12/02/22   Clayborne Dana, NP  meclizine (ANTIVERT) 25 MG tablet Take 1 tablet (25 mg total) by mouth 3 (three) times daily as needed for dizziness. 01/29/22   Cecil Cobbs, PA-C  meloxicam (MOBIC) 15 MG tablet Take 1 tablet (15 mg total) by mouth daily as needed. 12/02/22   Clayborne Dana, NP  metFORMIN (GLUCOPHAGE) 850 MG tablet Take 1 tablet (850 mg total) by mouth 2 (two) times daily. 12/02/22   Clayborne Dana, NP   metoprolol succinate (TOPROL-XL) 50 MG 24 hr tablet Take 1 tablet (50 mg total) by mouth daily. Take with or immediately following a meal 01/30/23   Clayborne Dana, NP  omeprazole (PRILOSEC) 20 MG capsule Take 1 capsule (20 mg total) by mouth every morning. 12/02/22   Clayborne Dana, NP  ondansetron (ZOFRAN) 8 MG tablet Take by mouth every 8 (eight) hours as needed for nausea or vomiting.    [provider]     Physical Exam: Vitals:   02/03/23 0045 02/03/23 0150 02/03/23 0153 02/03/23 0304  BP: 103/72  (!) 121/90 106/64  Pulse: 91   88  Resp: 17   18  Temp:    97.9 F (36.6 C)  TempSrc:    Oral  SpO2: 97%     Weight:   91.4 kg   Height:  5' (1.524 m)      Physical Exam Constitutional:      General: She is not in acute distress.    Appearance: She is not ill-appearing.  HENT:     Head:     Comments: Left-sided maxillofacial tenderness.     Right Ear: Tympanic membrane, ear canal and external ear normal. There is no impacted cerumen.     Left Ear: Tympanic membrane,  ear canal and external ear normal. There is no impacted cerumen.     Nose: Nose normal. No congestion.     Mouth/Throat:     Mouth: Mucous membranes are moist.  Eyes:     Conjunctiva/sclera: Conjunctivae normal.  Cardiovascular:     Rate and Rhythm: Tachycardia present.     Pulses: Normal pulses.     Heart sounds: Normal heart sounds.  Pulmonary:     Effort: Pulmonary effort is normal.     Breath sounds: Normal breath sounds.  Abdominal:     General: Bowel sounds are normal. There is no distension.     Tenderness: There is no abdominal tenderness. There is no guarding.  Musculoskeletal:        General: No swelling.     Cervical back: Neck supple.     Right lower leg: No edema.     Left lower leg: No edema.  Skin:    General: Skin is dry.     Capillary Refill: Capillary refill takes less than 2 seconds.  Neurological:     Mental Status: She is oriented to person, place, and time.     Cranial  Nerves: No cranial nerve deficit.     Motor: No weakness.  Psychiatric:        Mood and Affect: Mood normal.        Behavior: Behavior normal.        Thought Content: Thought content normal.      Labs on Admission: I have personally reviewed following labs and imaging studies  CBC: Recent Labs  Lab 02/02/23 1806  WBC 18.2*  NEUTROABS 13.6*  HGB 13.3  HCT 40.6  MCV 83.9  PLT 390   Basic Metabolic Panel: Recent Labs  Lab 01/27/23 1415 02/02/23 1806  NA 137 134*  K 4.2 3.6  CL 96 97*  CO2 28 22  GLUCOSE 161* 235*  BUN 12 37*  CREATININE 0.78 3.70*  CALCIUM 10.7* 9.3   GFR: Estimated Creatinine Clearance: 18.1 mL/min (A) (by C-G formula based on SCr of 3.7 mg/dL (H)). Liver Function Tests: Recent Labs  Lab 01/27/23 1415 02/02/23 1806  AST 10 15  ALT 11 13  ALKPHOS 199* 131*  BILITOT 0.7 0.5  PROT 8.3 7.1  ALBUMIN 4.6 3.5   No results for input(s): "LIPASE", "AMYLASE" in the last 168 hours. No results for input(s): "AMMONIA" in the last 168 hours. Coagulation Profile: Recent Labs  Lab 02/02/23 1806  INR 1.0   Cardiac Enzymes: No results for input(s): "CKTOTAL", "CKMB", "CKMBINDEX", "TROPONINI", "TROPONINIHS" in the last 168 hours. BNP (last 3 results) No results for input(s): "BNP" in the last 8760 hours. HbA1C: No results for input(s): "HGBA1C" in the last 72 hours. CBG: Recent Labs  Lab 02/03/23 0303  GLUCAP 234*   Lipid Profile: No results for input(s): "CHOL", "HDL", "LDLCALC", "TRIG", "CHOLHDL", "LDLDIRECT" in the last 72 hours. Thyroid Function Tests: No results for input(s): "TSH", "T4TOTAL", "FREET4", "T3FREE", "THYROIDAB" in the last 72 hours. Anemia Panel: No results for input(s): "VITAMINB12", "FOLATE", "FERRITIN", "TIBC", "IRON", "RETICCTPCT" in the last 72 hours. Urine analysis:    Component Value Date/Time   COLORURINE YELLOW 02/02/2023 2002   APPEARANCEUR CLOUDY (A) 02/02/2023 2002   LABSPEC >=1.030 02/02/2023 2002   PHURINE  5.5 02/02/2023 2002   GLUCOSEU 100 (A) 02/02/2023 2002   HGBUR TRACE (A) 02/02/2023 2002   BILIRUBINUR SMALL (A) 02/02/2023 2002   KETONESUR NEGATIVE 02/02/2023 2002   PROTEINUR >=300 (A) 02/02/2023 2002  UROBILINOGEN 0.2 05/25/2012 1411   NITRITE NEGATIVE 02/02/2023 2002   LEUKOCYTESUR NEGATIVE 02/02/2023 2002    Radiological Exams on Admission: I have personally reviewed images CT CHEST ABDOMEN PELVIS WO CONTRAST  Result Date: 02/02/2023 CLINICAL DATA:  Sepsis EXAM: CT CHEST, ABDOMEN AND PELVIS WITHOUT CONTRAST TECHNIQUE: Multidetector CT imaging of the chest, abdomen and pelvis was performed following the standard protocol without IV contrast. RADIATION DOSE REDUCTION: This exam was performed according to the departmental dose-optimization program which includes automated exposure control, adjustment of the mA and/or kV according to patient size and/or use of iterative reconstruction technique. COMPARISON:  None Available. FINDINGS: CT CHEST FINDINGS Cardiovascular: No significant vascular findings. Normal heart size. No pericardial effusion. Mediastinum/Nodes: No enlarged mediastinal, hilar, or axillary lymph nodes. Thyroid gland, trachea, and esophagus demonstrate no significant findings. Lungs/Pleura: Lungs are clear. No pleural effusion or pneumothorax. Musculoskeletal: No chest wall mass or suspicious bone lesions identified. CT ABDOMEN PELVIS FINDINGS Hepatobiliary: No focal liver abnormality is seen. Status post cholecystectomy. No biliary dilatation. Pancreas: Unremarkable Spleen: Unremarkable Adrenals/Urinary Tract: Adrenal glands are unremarkable. Kidneys are normal, without renal calculi, focal lesion, or hydronephrosis. Bladder is unremarkable. Stomach/Bowel: The stomach is unremarkable. A focal area of mild small bowel-small bowel intussusception is seen within the mid abdomen at axial image # 66/2, likely transient and of little clinical significance. No evidence of obstruction. The  cecum is hypermobile, located within the mid abdomen. No focal inflammatory change. No free intraperitoneal gas or fluid. The appendix is not clearly identified, however, no pericecal inflammatory changes seen. Vascular/Lymphatic: No significant vascular findings are present. No enlarged abdominal or pelvic lymph nodes. Reproductive: Status post hysterectomy. No adnexal masses. Other: No abdominal wall hernia Musculoskeletal: Degenerative changes are seen within the lumbar spine. No acute bone abnormality. IMPRESSION: 1. No acute intrathoracic or intra-abdominal pathology identified. No definite radiographic explanation for the patient's reported symptoms. 2. Status post cholecystectomy and hysterectomy. Electronically Signed   By: Helyn Numbers M.D.   On: 02/02/2023 21:05   DG Chest Port 1 View  Result Date: 02/02/2023 CLINICAL DATA:  Questionable sepsis, dizziness, nausea. EXAM: PORTABLE CHEST 1 VIEW COMPARISON:  Chest radiograph dated 12/09/2022. FINDINGS: The heart size and mediastinal contours are within normal limits. There is minimal left basilar atelectasis. The right lung is clear. There is no pleural effusion or pneumothorax. The visualized skeletal structures are unremarkable. IMPRESSION: Minimal left basilar atelectasis. Electronically Signed   By: Romona Curls M.D.   On: 02/02/2023 19:41    EKG: My personal interpretation of EKG shows: EKG shows sinus tachycardia heart rate 101.  No ST and T wave abnormality.    Assessment/Plan: Principal Problem:   Severe sepsis (HCC) Active Problems:   AKI (acute kidney injury) (HCC)   Acute cystitis   Essential hypertension   Non-insulin dependent type 2 diabetes mellitus (HCC)   Hyperlipidemia   Postural dizziness with presyncope   GERD (gastroesophageal reflux disease)    Assessment and Plan: Severe sepsis Acute cystitis Left-sided maxillary sinusitis Patient coming with complaining of left-sided sinus pain and pressure with associated  left-sided ear pain and pressure with associated dizziness and near fall.  She is also complaining about increased bladder spasm at the end of the micturition.  Denies any fever or chill - At presentation to ED patient found tachycardia cardia 107, hypotensive blood pressure dropped to 71/52, leukocytosis 18.2, elevated lactic acid 3.2.  Likely source of infection acute cystitis and concern for left maxillary sinusitis. -Tachycardia, hypotension and lactic  acid level has been improved after receiving 4 L of normal saline bolus in the ED.  Also received vancomycin, ceftriaxone and azithromycin in the ED. -UA showed evidence of UTI.  Pending urine culture - Pending blood culture. - Respiratory panel negative. -Chest x-ray unremarkable except minimal left basilar atelectasis. - CT chest and abdomen pelvis no acute intrathoracic or intra-abdominal finding.  Status pos cholecystectomy and hysterectomy. -Concern for severe sepsis in the setting of acute cystitis and left-sided maxillary sinusitis.  Given patient has AKI unable to obtain CT maxillofacial with contrast ingested getting CT maxillofacial without contrast.   -Continue broad-spectrum coverage with vancomycin and cefepime renally dosed by pharmacy. - Will follow-up with blood culture result, urine culture results and CT maxillofacial finding for the source of infection and better antibiotic guidance. -Monitor urine output - Continue monitor CBC and CMP - Continue maintenance fluid NS 100 cc/h for 24-hours -Continue cardiac monitoring   Presyncope and near fall -Patient has history of chronic vertigo however recently she is experiencing presyncope with near fall.  It could be from worsening of vertigo versus development of vertigo from left-sided ear fullness.  Will obtain echocardiogram to make sure there is no underlying cardiac abnormality.  EKG showed sinus tachycardia. -If echocardiogram normal no need for sure Holter monitor on  discharge. -Consulted PT vestibular therapy for evaluation.  Prerenal AKI -Elevated creatinine 3.7 on presentation.  Baseline normal renal function. There is no concern for urinary retention.   - Ordered [place] to monitor urine output. - Obtaining renal ultrasound.  Checking urine sodium and creatinine level. - Continue IV hydration with NS 100 cc/h for next 24 hours - Avoid nephrotoxic agents when possible and renally dose medication  Essential hypertension -at home patient is on hydrochlorothiazide 25 mg daily, lisinopril 40 mg daily and metoprolol XL 50 mg daily.  Holding blood pressure regimen in the setting of sepsis.  Non-insulin-dependent DM type II -A1c 7.5 checked on 11/06/2022.  Patient takes metformin 850 mg twice daily. - Holding metformin in the setting of lactic acidosis. - Continue carb consistent diet, low sliding scale SSI as needed for mealtime coverage and POC blood glucose check with mealtime.  Hyperlipidemia -Continue Lipitor 20 mg daily  History of vertigo -Continue meclizine 25 mg 3 times daily for dizziness and vertigo.  Continue Zofran as needed  GERD -Continue Protonix 40 mg daily   DVT prophylaxis:  SQ Heparin Code Status:  Full Code Diet: Carb modified diet Family Communication: Discussed treatment plan with patient. Disposition Plan: Pending clinical improvement.  Tentative plan to discharge to home in 4 to 5 days. Consults: Physical therapy Admission status:   Inpatient, progressive unit  Severity of Illness: The appropriate patient status for this patient is INPATIENT. Inpatient status is judged to be reasonable and necessary in order to provide the required intensity of service to ensure the patient's safety. The patient's presenting symptoms, physical exam findings, and initial radiographic and laboratory data in the context of their chronic comorbidities is felt to place them at high risk for further clinical deterioration. Furthermore, it is not  anticipated that the patient will be medically stable for discharge from the hospital within 2 midnights of admission.   * I certify that at the point of admission it is my clinical judgment that the patient will require inpatient hospital care spanning beyond 2 midnights from the point of admission due to high intensity of service, high risk for further deterioration and high frequency of surveillance required.*  Tereasa Coop, MD [facility]  How to contact the [facility] Attending or Consulting provider 7A - 7P or covering provider during after hours 7P -7A, for this patient.  Check the care team in [facility] and look for a) attending/consulting [facility] provider listed and b) the [facility] team listed Log into [email or web address] and use [facility]'s universal password to access. If you do not have the password, please contact the hospital operator. Locate the [facility] provider you are looking for under [facility] and page to a number that you can be directly reached. If you still have difficulty reaching the provider, please page the [facility] (Director on Call) for the Hospitalists listed on amion for assistance.  02/03/2023, 4:54 AM

## 2023-02-03 NOTE — Progress Notes (Signed)
Courtesy note- no billing.  Patient is seen and examined today morning. She is admitted for sepsis of urologic origin, possible sinusitis. She feels weak, did not get out of bed. CT maxillofacial unremarkable for sinusitis. White count trending down. Repeat lactic acid ordered. Continue gentle IV hydration, IV rocephin therapy. Follow culture results. Continue accucheks, insulin sliding scale. PT evaluation. Encourage out of bed, incentive spirometry. Further management per clinical course.

## 2023-02-03 NOTE — Evaluation (Signed)
Physical Therapy Evaluation Patient Details Name: Lauren Soto MRN: [id number] DOB: 07-20-1970 Today's Date: 02/03/2023  History of Present Illness  52 yo female admitted with acute cystitis, L side max sinusitis, severe sepsis. Hx of vertigo, DM, obesity  Clinical Impression   On eval, pt was Supv-Min guard A for mobility. She walked ~125 feet x 2. She tolerated activity fairly well. Still reports some sensitivity to light, mild headache, mild dizziness, mild unsteadiness. Dizziness not worsened by head turns, positional changes. Cues for pt to change positions slowly and to avoid quick head turns/changes in direction when mobilizing. Pt able to scan environment while ambulating.  Pt reported symptoms began on 8/28 as listed below: -pounding headache when she got up, felt like head was in a tunnel/heavy, sensitive to light, pain top/back of head, ears stopped up  -next day: still not feeling well could not go to work, went to doctor-possible sinus involvement -next day: standing at counter room went black, disturbed vision, pounding headache, ears stopped up, difficulty ambulating, passed out while trying to walk to bedroom, diaphoretic (went to [facility] discharged from ED same day)  Sunday 8/4-dizzy, weird feeling of closing in, couldn't stand up  Hx of vertigo but current symptoms are different than symptoms she experienced before.Takes meclizine PRN.   Will plan to follow pt during this hospital stay. Will progress activity as tolerated. Pt could consider OP PT evaluation if symptoms persist or worsen.       If plan is discharge home, recommend the following: Help with stairs or ramp for entrance;Assist for transportation;Assistance with cooking/housework;A little help with walking and/or transfers   Can travel by private vehicle        Equipment Recommendations  (continuing to assess)  Recommendations for Other Services       Functional Status Assessment Patient  has had a recent decline in their functional status and demonstrates the ability to make significant improvements in function in a reasonable and predictable amount of time.     Precautions / Restrictions Precautions Precautions: Fall Restrictions Weight Bearing Restrictions: No      Mobility  Bed Mobility Overal bed mobility: Modified Independent                  Transfers Overall transfer level: Needs assistance   Transfers: Sit to/from Stand Sit to Stand: Supervision                Ambulation/Gait Ambulation/Gait assistance: Supervision, Min guard Gait Distance (Feet): 125 Feet (x2) Assistive device: IV Pole, Rolling walker (2 wheels) Gait Pattern/deviations: Step-through pattern, Decreased stride length       General Gait Details: walked x 1 with IV pole, then x 1 with RW. Progressed from Min guard A-Supv A. Mild unsteadiness but no LOB-she reports her legs feel weak. She did have sensitivity to hallway light with mild headache while ambulating. HR 110 bpm  Stairs            Wheelchair Mobility     Tilt Bed    Modified Rankin (Stroke Patients Only)       Balance Overall balance assessment: Needs assistance         Standing balance support: During functional activity Standing balance-Leahy Scale: Fair                               Pertinent Vitals/Pain Pain Assessment Pain Assessment: Faces Faces Pain Scale: Hurts a little  bit Pain Location: headache Pain Intervention(s): Monitored during session    Home Living Family/patient expects to be discharged to:: Private residence Living Arrangements: Spouse/significant other;Children   Type of Home: House Home Access: Stairs to enter   Secretary/administrator of Steps: 4   Home Layout: One level Home Equipment: None      Prior Function Prior Level of Function : Independent/Modified Independent                     Hand Dominance        Extremity/Trunk  Assessment   Upper Extremity Assessment Upper Extremity Assessment: Overall WFL for tasks assessed    Lower Extremity Assessment Lower Extremity Assessment: Generalized weakness    Cervical / Trunk Assessment Cervical / Trunk Assessment: Normal  Communication   Communication: No difficulties  Cognition Arousal/Alertness: Awake/alert Behavior During Therapy: WFL for tasks assessed/performed Overall Cognitive Status: Within Functional Limits for tasks assessed                                          General Comments      Exercises     Assessment/Plan    PT Assessment Patient needs continued PT services  PT Problem List Decreased activity tolerance;Decreased balance;Decreased mobility;Decreased knowledge of use of DME;Pain       PT Treatment Interventions DME instruction;Therapeutic exercise;Gait training;Balance training;Functional mobility training;Therapeutic activities;Patient/family education    PT Goals (Current goals can be found in the Care Plan section)  Acute Rehab PT Goals Patient Stated Goal: regain PLOF/independence/return to work PT Goal Formulation: With patient Time For Goal Achievement: 02/17/23 Potential to Achieve Goals: Good    Frequency Min 1X/week     Co-evaluation               AM-PAC PT "6 Clicks" Mobility  Outcome Measure Help needed turning from your back to your side while in a flat bed without using bedrails?: None Help needed moving from lying on your back to sitting on the side of a flat bed without using bedrails?: None Help needed moving to and from a bed to a chair (including a wheelchair)?: A Little Help needed standing up from a chair using your arms (e.g., wheelchair or bedside chair)?: A Little Help needed to walk in hospital room?: A Little Help needed climbing 3-5 steps with a railing? : A Little 6 Click Score: 20    End of Session   Activity Tolerance: Patient tolerated treatment well Patient  left: in bed;with call bell/phone within reach;with bed alarm set   PT Visit Diagnosis: Unsteadiness on feet (R26.81);Muscle weakness (generalized) (M62.81);Difficulty in walking, not elsewhere classified (R26.2)    Time: 2956-2130 PT Time Calculation (min) (ACUTE ONLY): 49 min   Charges:   PT Evaluation $PT Eval Low Complexity: 1 Low PT Treatments $Gait Training: 23-37 mins PT General Charges $$ ACUTE PT VISIT: 1 Visit          Faye Ramsay, PT Acute Rehabilitation  Office: [phone]

## 2023-02-04 DIAGNOSIS — A419 Sepsis, unspecified organism: Secondary | ICD-10-CM | POA: Diagnosis not present

## 2023-02-04 DIAGNOSIS — R6520 Severe sepsis without septic shock: Secondary | ICD-10-CM | POA: Diagnosis not present

## 2023-02-04 LAB — COMPREHENSIVE METABOLIC PANEL
ALT: 13 U/L (ref 0–44)
AST: 13 U/L — ABNORMAL LOW (ref 15–41)
Albumin: 3.1 g/dL — ABNORMAL LOW (ref 3.5–5.0)
Alkaline Phosphatase: 109 U/L (ref 38–126)
Anion gap: 10 (ref 5–15)
BUN: 21 mg/dL — ABNORMAL HIGH (ref 6–20)
CO2: 21 mmol/L — ABNORMAL LOW (ref 22–32)
Calcium: 9.1 mg/dL (ref 8.9–10.3)
Chloride: 107 mmol/L (ref 98–111)
Creatinine, Ser: 0.94 mg/dL (ref 0.44–1.00)
GFR, Estimated: >60 mL/min (ref >60)
Glucose, Bld: 101 mg/dL — ABNORMAL HIGH (ref 70–99)
Potassium: 3.7 mmol/L (ref 3.5–5.1)
Sodium: 138 mmol/L (ref 135–145)
Total Bilirubin: 0.5 mg/dL (ref 0.3–1.2)
Total Protein: 6.6 g/dL (ref 6.5–8.1)

## 2023-02-04 LAB — GLUCOSE, CAPILLARY
Glucose-Capillary: 87 mg/dL (ref 70–99)
Glucose-Capillary: 133 mg/dL — ABNORMAL HIGH (ref 70–99)

## 2023-02-04 MED ORDER — SODIUM CHLORIDE 0.9 % IV SOLN
2.0000 g | INTRAVENOUS | Status: DC
  Administered 2023-02-04: 2 g via INTRAVENOUS
  Filled 2023-02-04: qty 20

## 2023-02-04 MED ORDER — CEFDINIR 300 MG PO CAPS: 300.0000 mg | ORAL_CAPSULE | Freq: Two times a day (BID) | ORAL | 0 refills | Status: AC

## 2023-02-04 NOTE — Progress Notes (Signed)
Physical Therapy Treatment Patient Details Name: Lauren Soto MRN: [id number] DOB: 1970-11-17 Today's Date: 02/04/2023   History of Present Illness 52 yo female admitted with acute cystitis, L side max sinusitis, severe sepsis. Hx of vertigo, DM, obesity    PT Comments  Pt agreeable to therapy. She tolerated activity well. Pt reports feeling better each day. Will continue to follow.     If plan is discharge home, recommend the following: Help with stairs or ramp for entrance;Assist for transportation;Assistance with cooking/housework;A little help with walking and/or transfers   Can travel by private vehicle        Equipment Recommendations  None recommended by PT    Recommendations for Other Services       Precautions / Restrictions Precautions Precautions: Fall Restrictions Weight Bearing Restrictions: No     Mobility  Bed Mobility Overal bed mobility: Modified Independent                  Transfers Overall transfer level: Modified independent                      Ambulation/Gait Ambulation/Gait assistance: Supervision Gait Distance (Feet): 250 Feet Assistive device: Rollator (4 wheels) Gait Pattern/deviations: Step-through pattern, Decreased stride length       General Gait Details: Supv for safety. No LOB. No dizziness. Tolerated distance well.   Stairs             Wheelchair Mobility     Tilt Bed    Modified Rankin (Stroke Patients Only)       Balance Overall balance assessment: Needs assistance         Standing balance support: During functional activity Standing balance-Leahy Scale: Fair                              Cognition Arousal/Alertness: Awake/alert Behavior During Therapy: WFL for tasks assessed/performed Overall Cognitive Status: Within Functional Limits for tasks assessed                                          Exercises      General Comments         Pertinent Vitals/Pain Pain Assessment Pain Assessment: Faces Faces Pain Scale: No hurt    Home Living                          Prior Function            PT Goals (current goals can now be found in the care plan section) Progress towards PT goals: Progressing toward goals    Frequency    Min 1X/week      PT Plan Current plan remains appropriate    Co-evaluation              AM-PAC PT "6 Clicks" Mobility   Outcome Measure  Help needed turning from your back to your side while in a flat bed without using bedrails?: None Help needed moving from lying on your back to sitting on the side of a flat bed without using bedrails?: None Help needed moving to and from a bed to a chair (including a wheelchair)?: None Help needed standing up from a chair using your arms (e.g., wheelchair or bedside chair)?: None Help needed to walk in hospital room?:  A Little Help needed climbing 3-5 steps with a railing? : A Little 6 Click Score: 22    End of Session   Activity Tolerance: Patient tolerated treatment well Patient left: in bed;with call bell/phone within reach   PT Visit Diagnosis: Unsteadiness on feet (R26.81);Muscle weakness (generalized) (M62.81);Difficulty in walking, not elsewhere classified (R26.2)     Time: 1610-9604 PT Time Calculation (min) (ACUTE ONLY): 15 min  Charges:    $Gait Training: 8-22 mins PT General Charges $$ ACUTE PT VISIT: 1 Visit                         Faye Ramsay, PT Acute Rehabilitation  Office: [phone]

## 2023-02-04 NOTE — Discharge Summary (Signed)
Physician Discharge Summary  Lauren Soto:[id number] DOB: Feb 16, 1971 DOA: 02/02/2023  PCP: Clayborne Dana, NP  Admit date: 02/02/2023 Discharge date: 02/04/2023  Admitted From: Home Discharge disposition: Home  Recommendations at discharge:  Complete course of antibiotics with 5 more days of oral Omnicef Your blood pressure remains normal.  I would rise to keep his medicines on hold.  Monitor blood pressure at home.  If systolic blood pressure is above 140, resume Toprol and resume other medicines one at a time under the supervision of PCP.  Brief narrative: Lauren Soto is a 52 y.o. female with PMH significant for DM2, HTN, HLD, vertigo, GERD 8/2, patient presented to the ED with complaint of generalized weakness, lightheadedness, dark-colored stool/diarrhea.     Patient reporting she is not feeling well over the course of last 1 week.  She developed left-sided earache and sinus pain, went to see her PCP and was prescribed some Flonase nasal spray.  She continued to have left-sided ear pain and also developed tinnitus, dizziness and loss of balance.  Had a presyncopal episode few days ago. Patient is also experiencing pelvic pain/pressure/discomfort at the end of micturition.  Denies any urinary urgency and increased frequency.  No flank pain and hematuria.  In the ED, patient had a temperature of 100, tachycardic to 107, blood pressure low at 74/53 Labs showed WBC count elevated to 18.2, hemoglobin 13 FOBT negative CMP with creatinine elevated to 3.7 against a baseline of 0.7.  Lactic acid elevated to 3.7 Urinalysis showed cloudy urine with many bacteria Chest x-ray unremarkable  CT abdomen/pelvis without contrast negative for any acute finding.  Prior history of cholecystectomy and hysterectomy. CT maxillofacial showed normally aerated paranasal sinuses and patent sinus drainage pathways. Urine culture sent.  Blood culture sent In the ED, patient was started on  IV fluid, IV antibiotics, IV steroid Admitted to [facility]  Subjective: Patient was seen and examined this afternoon.  Pleasant middle-aged African-American female.  Lying down on bed not in distress.  Symptoms improved.  Sinus pain, left ear pain gone.  Able to walk on the hallway today without dizziness.  Reports no dysuria today. Wants to go home.  Family members on the phone agreeable. Chart reviewed. In the last 24 hours, no fever, hemodynamically stable Most recent labs this morning with WBC count 11, hemoglobin 10.9  Assessment and plan: Severe sepsis Acute cystitis Left-sided maxillary sinusitis Presented with symptoms of left earache, sinus pain, dizziness, tinnitus for a week. However CT maxillofacial scan without any acute abnormalities Also reported pelvic pain at the end of micturition.  Urinalysis with cloudy urine and many bacteria. Met sepsis criteria with fever, tachycardia, hypotension, lactic acidosis and AKI Currently improving on IV cefepime and IV vancomycin. No growth on blood culture.  Multiple species in the urine culture.  Nonsignificant. No recurrence of fever in the last 24 hours.  WBC improving.  Lactic acid level normalized.   Symptoms much better.  Feels ready to go home.  Will discharge her home today on oral Omnicef for next 5 days. Recent Labs  Lab 02/02/23 1806 02/02/23 1959 02/02/23 2154 02/02/23 2346 02/03/23 0504 02/03/23 1722 02/03/23 2124 02/04/23 0407  WBC 18.2*  --   --   --  13.4*  --   --  11.0*  LATICACIDVEN 3.5* 3.6* 2.2* 2.4*  --  2.0* 1.4  --    Dark-colored stool/diarrhea Reported area for 1 day at presentation. FOBT negative. Diarrhea essentially improved.  AKI Baseline  creatinine normal.  Presented with creatinine elevated to 3.7 due to dehydration.  Significantly improved to normal with IV hydration. Recent Labs    11/06/22 1443 12/09/22 1223 01/27/23 1415 02/02/23 1806 02/03/23 0504 02/04/23 0725  BUN 9 6 12  37* 32* 21*   CREATININE 0.73 0.67 0.78 3.70* 2.33* 0.94   Presyncope Chronic vertigo Patient has chronic vertigo for the last several years and is on meclizine as needed. Recently had presyncopal episode, probably due to dehydration  echocardiogram with EF normal at 60 to 65%. Continue as needed meclizine  Essential hypertension PTA meds-Toprol 50 mg daily, HCTZ 25 mg daily, lisinopril 40 mg daily  Currently on hold in the setting of sepsis.  Blood pressure remains normal.  I have advised patient to keep his medicines on hold.  Monitor blood pressure at home.  If systolic blood pressure is above 140, resume Toprol.  Continue to monitor blood pressure and resume other medicines one at a time under the supervision of PCP  Type 2 diabetes mellitus A1c 7.3 on 02/03/2023 PTA meds-metformin 850 mg twice daily.  Currently on hold Okay to resume postdischarge Recent Labs  Lab 02/03/23 1208 02/03/23 1644 02/03/23 2050 02/04/23 0744 02/04/23 1251  GLUCAP 98 149* 190* 87 133*   Hyperlipidemia Continue Lipitor 20 mg daily    GERD Continue Protonix 40 mg daily     Wounds:  -    Discharge Exam:   Vitals:   02/04/23 0428 02/04/23 0454 02/04/23 0727 02/04/23 1439  BP: 120/89   133/86  Pulse: 85   89  Resp: 19  17 20   Temp: 97.6 F (36.4 C)   98.3 F (36.8 C)  TempSrc: Oral   Oral  SpO2: 100%   97%  Weight:  92.1 kg    Height:        Body mass index is 39.65 kg/m.  General exam: Pleasant, middle-aged African-American female Skin: No rashes, lesions or ulcers. HEENT: Atraumatic, normocephalic, no obvious bleeding Lungs: Clear to auscultation bilaterally CVS: Regular rate and rhythm, no murmur GI/Abd soft, nontender, nondistended, bowel sound present CNS: Alert, awake, oriented X 3 Psychiatry: Mood appropriate Extremities: No pedal edema, no calf tenderness  Follow ups:    Follow-up Information     Clayborne Dana, NP Follow up.   Specialties: Family Medicine, Emergency  Medicine Contact information: [address] [place] [address] [phone]                 Discharge Instructions:   Discharge Instructions     Call MD for:  difficulty breathing, headache or visual disturbances   Complete by: As directed    Call MD for:  extreme fatigue   Complete by: As directed    Call MD for:  hives   Complete by: As directed    Call MD for:  persistant dizziness or light-headedness   Complete by: As directed    Call MD for:  persistant nausea and vomiting   Complete by: As directed    Call MD for:  severe uncontrolled pain   Complete by: As directed    Call MD for:  temperature >100.4   Complete by: As directed    Diet general   Complete by: As directed    Discharge instructions   Complete by: As directed    Blood pressure remains normal.  I have advised patient to keep his medicines on hold.  Monitor blood pressure at home.  If systolic blood  pressure is above 140, resume Toprol.  Continue to monitor blood pressure and resume other medicines one at a time under the supervision of PCP  General discharge instructions: Follow with Primary MD Clayborne Dana, NP in 7 days  Please request your PCP  to go over your hospital tests, procedures, radiology results at the follow up. Please get your medicines reviewed and adjusted.  Your PCP may decide to repeat certain labs or tests as needed. Do not drive, operate heavy machinery, perform activities at heights, swimming or participation in water activities or provide baby sitting services if your were admitted for syncope or siezures until you have seen by Primary MD or a Neurologist and advised to do so again. [place] Controlled Substance Reporting System database was reviewed. Do not drive, operate heavy machinery, perform activities at heights, swim, participate in water activities or provide baby-sitting services while on medications for pain, sleep and mood until your  outpatient physician has reevaluated you and advised to do so again.  You are strongly recommended to comply with the dose, frequency and duration of prescribed medications. Activity: As tolerated with Full fall precautions use walker/cane & assistance as needed Avoid using any recreational substances like cigarette, tobacco, alcohol, or non-prescribed drug. If you experience worsening of your admission symptoms, develop shortness of breath, life threatening emergency, suicidal or homicidal thoughts you must seek medical attention immediately by calling 911 or calling your MD immediately  if symptoms less severe. You must read complete instructions/literature along with all the possible adverse reactions/side effects for all the medicines you take and that have been prescribed to you. Take any new medicine only after you have completely understood and accepted all the possible adverse reactions/side effects.  Wear Seat belts while driving. You were cared for by a hospitalist during your hospital stay. If you have any questions about your discharge medications or the care you received while you were in the hospital after you are discharged, you can call the unit and ask to speak with the hospitalist or the covering physician. Once you are discharged, your primary care physician will handle any further medical issues. Please note that NO REFILLS for any discharge medications will be authorized once you are discharged, as it is imperative that you return to your primary care physician (or establish a relationship with a primary care physician if you do not have one).   Increase activity slowly   Complete by: As directed        Discharge Medications:   Allergies as of 02/04/2023   No Known Allergies      Medication List     STOP taking these medications    hydrochlorothiazide 25 MG tablet Commonly known as: HYDRODIURIL   lisinopril 40 MG tablet Commonly known as: ZESTRIL   meloxicam 15 MG  tablet Commonly known as: MOBIC   metoprolol succinate 50 MG 24 hr tablet Commonly known as: TOPROL-XL       TAKE these medications    atorvastatin 20 MG tablet Commonly known as: LIPITOR Take 1 tablet (20 mg total) by mouth daily.   cefdinir 300 MG capsule Commonly known as: OMNICEF Take 1 capsule (300 mg total) by mouth 2 (two) times daily for 5 days.   cyclobenzaprine 10 MG tablet Commonly known as: FLEXERIL Take 1 tablet (10 mg total) by mouth 2 (two) times daily as needed for muscle spasms. What changed: when to take this   diclofenac Sodium 1 % Gel Commonly known as: VOLTAREN  Apply 2 g topically 4 (four) times daily as needed (pain).   fluticasone 50 MCG/ACT nasal spray Commonly known as: FLONASE Place 2 sprays into both nostrils daily.   meclizine 25 MG tablet Commonly known as: ANTIVERT Take 1 tablet (25 mg total) by mouth 3 (three) times daily as needed for dizziness. What changed: when to take this   metFORMIN 850 MG tablet Commonly known as: GLUCOPHAGE Take 1 tablet (850 mg total) by mouth 2 (two) times daily.   omeprazole 20 MG capsule Commonly known as: PRILOSEC Take 1 capsule (20 mg total) by mouth every morning.   ondansetron 8 MG tablet Commonly known as: ZOFRAN Take by mouth every 8 (eight) hours as needed for nausea or vomiting.   pregabalin 100 MG capsule Commonly known as: LYRICA Take 100 mg by mouth 3 (three) times daily.         The results of significant diagnostics from this hospitalization (including imaging, microbiology, ancillary and laboratory) are listed below for reference.    Procedures and Diagnostic Studies:   ECHOCARDIOGRAM COMPLETE  Result Date: 02/03/2023    ECHOCARDIOGRAM REPORT   Patient Name:   Lauren Soto Date of Exam: 02/03/2023 Medical Rec #:  [id number]              Height:       60.0 in Accession #:    [id number]             Weight:       201.5 lb Date of Birth:  Aug 03, 1970              BSA:           1.873 m Patient Age:    52 years               BP:           106/64 mmHg Patient Gender: F                      HR:           83 bpm. Exam Location:  Inpatient Procedure: 2D Echo, Cardiac Doppler, Color Doppler and Intracardiac            Opacification Agent Indications:    R55 Syncope  History:        Patient has no prior history of Echocardiogram examinations.                 Risk Factors:Hypertension and Diabetes.  Sonographer:    Harriette Bouillon RDCS Referring Phys: [phone] SUBRINA SUNDIL IMPRESSIONS  1. Left ventricular ejection fraction, by estimation, is 60 to 65%. The left ventricle has normal function. The left ventricle has no regional wall motion abnormalities. Left ventricular diastolic parameters are consistent with Grade I diastolic dysfunction (impaired relaxation).  2. Right ventricular systolic function is normal. The right ventricular size is normal.  3. The mitral valve is normal in structure. No evidence of mitral valve regurgitation.  4. The aortic valve is tricuspid. Aortic valve regurgitation is not visualized.  5. The inferior vena cava is normal in size with greater than 50% respiratory variability, suggesting right atrial pressure of 3 mmHg. FINDINGS  Left Ventricle: Left ventricular ejection fraction, by estimation, is 60 to 65%. The left ventricle has normal function. The left ventricle has no regional wall motion abnormalities. Definity contrast agent was given IV to delineate the left ventricular  endocardial borders. The left ventricular internal cavity size was normal  in size. There is no left ventricular hypertrophy. Left ventricular diastolic parameters are consistent with Grade I diastolic dysfunction (impaired relaxation). Right Ventricle: The right ventricular size is normal. Right ventricular systolic function is normal. Left Atrium: Left atrial size was normal in size. Right Atrium: Right atrial size was normal in size. Pericardium: There is no evidence of pericardial effusion.  Mitral Valve: The mitral valve is normal in structure. No evidence of mitral valve regurgitation. Tricuspid Valve: The tricuspid valve is normal in structure. Tricuspid valve regurgitation is not demonstrated. Aortic Valve: The aortic valve is tricuspid. Aortic valve regurgitation is not visualized. Pulmonic Valve: The pulmonic valve was normal in structure. Pulmonic valve regurgitation is not visualized. Aorta: The aortic root and ascending aorta are structurally normal, with no evidence of dilitation. Venous: The inferior vena cava is normal in size with greater than 50% respiratory variability, suggesting right atrial pressure of 3 mmHg. IAS/Shunts: No atrial level shunt detected by color flow Doppler.  LEFT VENTRICLE PLAX 2D LVIDd:         4.20 cm   Diastology LVIDs:         2.30 cm   LV e' medial:    8.16 cm/s LV PW:         0.90 cm   LV E/e' medial:  10.8 LV IVS:        0.80 cm   LV e' lateral:   9.14 cm/s LVOT diam:     1.80 cm   LV E/e' lateral: 9.6 LV SV:         71 LV SV Index:   38 LVOT Area:     2.54 cm  RIGHT VENTRICLE             IVC RV S prime:     14.80 cm/s  IVC diam: 1.60 cm TAPSE (M-mode): 1.7 cm LEFT ATRIUM             Index        RIGHT ATRIUM           Index LA diam:        3.50 cm 1.87 cm/m   RA Area:     12.40 cm LA Vol (A2C):   53.1 ml 28.36 ml/m  RA Volume:   25.70 ml  13.72 ml/m LA Vol (A4C):   57.2 ml 30.55 ml/m LA Biplane Vol: 56.9 ml 30.39 ml/m  AORTIC VALVE LVOT Vmax:   145.00 cm/s LVOT Vmean:  99.400 cm/s LVOT VTI:    0.280 m  AORTA Ao Root diam: 3.00 cm Ao Asc diam:  2.80 cm MITRAL VALVE MV Area (PHT): 4.17 cm     SHUNTS MV Decel Time: 182 msec     Systemic VTI:  0.28 m MV E velocity: 88.10 cm/s   Systemic Diam: 1.80 cm MV A velocity: 109.00 cm/s MV E/A ratio:  0.81 [place] signed by Carolan Clines Signature Date/Time: 02/03/2023/12:50:36 PM    Final    CT Maxillofacial LTD WO CM  Result Date: 02/03/2023 CLINICAL DATA:  Sinusitis, acute, uncomplicated  Left-sided ear fullness and left-sided maxillary sinus pain. EXAM: CT PARANASAL SINUS LIMITED WITHOUT CONTRAST TECHNIQUE: Multidetector CT images of the paranasal sinuses were obtained using the standard protocol without intravenous contrast. RADIATION DOSE REDUCTION: This exam was performed according to the departmental dose-optimization program which includes automated exposure control, adjustment of the mA and/or kV according to patient size and/or use of iterative reconstruction technique. COMPARISON:  None Available. FINDINGS: Paranasal sinuses:  Frontal: Normally aerated. Patent frontal sinus drainage pathways. Ethmoid: Normally aerated. Maxillary: Normally aerated. Sphenoid: Normally aerated. Patent sphenoethmoidal recesses. Right ostiomeatal unit: Patent. Left ostiomeatal unit: Patent. Nasal passages: Patent. Intact nasal septum is midline. Anatomy: No pneumatization superior to anterior ethmoid notches. Symmetric and intact olfactory grooves and fovea ethmoidalis, Keros I (1-15mm). Sellar sphenoid pneumatization pattern. No dehiscence of carotid or optic canals. No onodi cell. Other: Orbits and intracranial compartment are unremarkable. Visible mastoid air cells are normally aerated. IMPRESSION: Normally aerated paranasal sinuses. Patent sinus drainage pathways. Electronically Signed   By: Orvan Falconer M.D.   On: 02/03/2023 08:54   US RENAL  Result Date: 02/03/2023 CLINICAL DATA:  Acute kidney injury EXAM: RENAL / URINARY TRACT ULTRASOUND COMPLETE COMPARISON:  CT from yesterday FINDINGS: Right Kidney: Renal measurements: 11 x 5 x 5 cm = volume: 150 mL. Echogenicity within normal limits. No mass or hydronephrosis visualized. Left Kidney: Renal measurements: 9 x 5 x 5 cm = volume: There is 130 mL. Echogenicity within normal limits. No mass or hydronephrosis visualized. Bladder: Appears normal for degree of bladder distention. IMPRESSION: Negative renal ultrasound.  No change since CT yesterday.  Electronically Signed   By: Tiburcio Pea M.D.   On: 02/03/2023 06:05   CT CHEST ABDOMEN PELVIS WO CONTRAST  Result Date: 02/02/2023 CLINICAL DATA:  Sepsis EXAM: CT CHEST, ABDOMEN AND PELVIS WITHOUT CONTRAST TECHNIQUE: Multidetector CT imaging of the chest, abdomen and pelvis was performed following the standard protocol without IV contrast. RADIATION DOSE REDUCTION: This exam was performed according to the departmental dose-optimization program which includes automated exposure control, adjustment of the mA and/or kV according to patient size and/or use of iterative reconstruction technique. COMPARISON:  None Available. FINDINGS: CT CHEST FINDINGS Cardiovascular: No significant vascular findings. Normal heart size. No pericardial effusion. Mediastinum/Nodes: No enlarged mediastinal, hilar, or axillary lymph nodes. Thyroid gland, trachea, and esophagus demonstrate no significant findings. Lungs/Pleura: Lungs are clear. No pleural effusion or pneumothorax. Musculoskeletal: No chest wall mass or suspicious bone lesions identified. CT ABDOMEN PELVIS FINDINGS Hepatobiliary: No focal liver abnormality is seen. Status post cholecystectomy. No biliary dilatation. Pancreas: Unremarkable Spleen: Unremarkable Adrenals/Urinary Tract: Adrenal glands are unremarkable. Kidneys are normal, without renal calculi, focal lesion, or hydronephrosis. Bladder is unremarkable. Stomach/Bowel: The stomach is unremarkable. A focal area of mild small bowel-small bowel intussusception is seen within the mid abdomen at axial image # 66/2, likely transient and of little clinical significance. No evidence of obstruction. The cecum is hypermobile, located within the mid abdomen. No focal inflammatory change. No free intraperitoneal gas or fluid. The appendix is not clearly identified, however, no pericecal inflammatory changes seen. Vascular/Lymphatic: No significant vascular findings are present. No enlarged abdominal or pelvic lymph nodes.  Reproductive: Status post hysterectomy. No adnexal masses. Other: No abdominal wall hernia Musculoskeletal: Degenerative changes are seen within the lumbar spine. No acute bone abnormality. IMPRESSION: 1. No acute intrathoracic or intra-abdominal pathology identified. No definite radiographic explanation for the patient's reported symptoms. 2. Status post cholecystectomy and hysterectomy. Electronically Signed   By: Helyn Numbers M.D.   On: 02/02/2023 21:05   DG Chest Port 1 View  Result Date: 02/02/2023 CLINICAL DATA:  Questionable sepsis, dizziness, nausea. EXAM: PORTABLE CHEST 1 VIEW COMPARISON:  Chest radiograph dated 12/09/2022. FINDINGS: The heart size and mediastinal contours are within normal limits. There is minimal left basilar atelectasis. The right lung is clear. There is no pleural effusion or pneumothorax. The visualized skeletal structures are unremarkable. IMPRESSION: Minimal left basilar  atelectasis. Electronically Signed   By: Romona Curls M.D.   On: 02/02/2023 19:41     Labs:   Basic Metabolic Panel: Recent Labs  Lab 02/02/23 1806 02/03/23 0504 02/04/23 0725  NA 134* 135 138  K 3.6 4.0 3.7  CL 97* 108 107  CO2 22 17* 21*  GLUCOSE 235* 195* 101*  BUN 37* 32* 21*  CREATININE 3.70* 2.33* 0.94  CALCIUM 9.3 7.8* 9.1   GFR Estimated Creatinine Clearance: 71.6 mL/min (by C-G formula based on SCr of 0.94 mg/dL). Liver Function Tests: Recent Labs  Lab 02/02/23 1806 02/03/23 0504 02/04/23 0725  AST 15 13* 13*  ALT 13 14 13   ALKPHOS 131* 101 109  BILITOT 0.5 0.4 0.5  PROT 7.1 5.9* 6.6  ALBUMIN 3.5 2.9* 3.1*   No results for input(s): "LIPASE", "AMYLASE" in the last 168 hours. No results for input(s): "AMMONIA" in the last 168 hours. Coagulation profile Recent Labs  Lab 02/02/23 1806  INR 1.0    CBC: Recent Labs  Lab 02/02/23 1806 02/03/23 0504 02/04/23 0407  WBC 18.2* 13.4* 11.0*  NEUTROABS 13.6*  --   --   HGB 13.3 10.9* 10.9*  HCT 40.6 35.2* 34.8*   MCV 83.9 89.3 87.4  PLT 390 279 231   Cardiac Enzymes: No results for input(s): "CKTOTAL", "CKMB", "CKMBINDEX", "TROPONINI" in the last 168 hours. BNP: Invalid input(s): "POCBNP" CBG: Recent Labs  Lab 02/03/23 1208 02/03/23 1644 02/03/23 2050 02/04/23 0744 02/04/23 1251  GLUCAP 98 149* 190* 87 133*   D-Dimer No results for input(s): "DDIMER" in the last 72 hours. Hgb A1c Recent Labs    02/03/23 0346  HGBA1C 7.3*   Lipid Profile No results for input(s): "CHOL", "HDL", "LDLCALC", "TRIG", "CHOLHDL", "LDLDIRECT" in the last 72 hours. Thyroid function studies No results for input(s): "TSH", "T4TOTAL", "T3FREE", "THYROIDAB" in the last 72 hours.  Invalid input(s): "FREET3" Anemia work up No results for input(s): "VITAMINB12", "FOLATE", "FERRITIN", "TIBC", "IRON", "RETICCTPCT" in the last 72 hours. Microbiology Recent Results (from the past 240 hour(s))  Resp panel by RT-PCR (RSV, Flu A&B, Covid)     Status: None   Collection Time: 02/02/23  5:59 PM   Specimen: Nasal Swab  Result Value Ref Range Status   SARS Coronavirus 2 by RT PCR NEGATIVE NEGATIVE Final    Comment: (NOTE) SARS-CoV-2 target nucleic acids are NOT DETECTED.  The SARS-CoV-2 RNA is generally detectable in upper respiratory specimens during the acute phase of infection. The lowest concentration of SARS-CoV-2 viral copies this assay can detect is 138 copies/mL. A negative result does not preclude SARS-Cov-2 infection and should not be used as the sole basis for treatment or other patient management decisions. A negative result may occur with  improper specimen collection/handling, submission of specimen other than nasopharyngeal swab, presence of viral mutation(s) within the areas targeted by this assay, and inadequate number of viral copies(<138 copies/mL). A negative result must be combined with clinical observations, patient history, and epidemiological information. The expected result is  Negative.  Fact Sheet for Patients:  BloggerCourse.com  Fact Sheet for Healthcare Providers:  SeriousBroker.it  This test is no t yet approved or cleared by the [place] FDA and  has been authorized for detection and/or diagnosis of SARS-CoV-2 by FDA under an Emergency Use Authorization (EUA). This EUA will remain  in effect (meaning this test can be used) for the duration of the COVID-19 declaration under Section 564(b)(1) of the Act, 21 U.S.C.section 360bbb-3(b)(1), unless the authorization is  terminated  or revoked sooner.       Influenza A by PCR NEGATIVE NEGATIVE Final   Influenza B by PCR NEGATIVE NEGATIVE Final    Comment: (NOTE) The Xpert Xpress SARS-CoV-2/FLU/RSV plus assay is intended as an aid in the diagnosis of influenza from Nasopharyngeal swab specimens and should not be used as a sole basis for treatment. Nasal washings and aspirates are unacceptable for Xpert Xpress SARS-CoV-2/FLU/RSV testing.  Fact Sheet for Patients: BloggerCourse.com  Fact Sheet for Healthcare Providers: SeriousBroker.it  This test is not yet approved or cleared by the [place] FDA and has been authorized for detection and/or diagnosis of SARS-CoV-2 by FDA under an Emergency Use Authorization (EUA). This EUA will remain in effect (meaning this test can be used) for the duration of the COVID-19 declaration under Section 564(b)(1) of the Act, 21 U.S.C. section 360bbb-3(b)(1), unless the authorization is terminated or revoked.     Resp Syncytial Virus by PCR NEGATIVE NEGATIVE Final    Comment: (NOTE) Fact Sheet for Patients: BloggerCourse.com  Fact Sheet for Healthcare Providers: SeriousBroker.it  This test is not yet approved or cleared by the [place] FDA and has been authorized for detection and/or diagnosis of  SARS-CoV-2 by FDA under an Emergency Use Authorization (EUA). This EUA will remain in effect (meaning this test can be used) for the duration of the COVID-19 declaration under Section 564(b)(1) of the Act, 21 U.S.C. section 360bbb-3(b)(1), unless the authorization is terminated or revoked.  Performed at [facility], [address] [place], [place] [address]   Blood Culture (routine x 2)     Status: None (Preliminary result)   Collection Time: 02/02/23  6:06 PM   Specimen: BLOOD RIGHT ARM  Result Value Ref Range Status   Specimen Description   Final    BLOOD RIGHT ARM Performed at [facility], [address] [place], [place] [address]    Special Requests   Final    BOTTLES DRAWN AEROBIC AND ANAEROBIC Blood Culture adequate volume Performed at [facility], [address] [place], [place] [address]    Culture   Final    NO GROWTH 2 DAYS Performed at [facility], [address], [place], [place] [address]    Report Status PENDING  Incomplete  Blood Culture (routine x 2)     Status: None (Preliminary result)   Collection Time: 02/02/23  6:09 PM   Specimen: BLOOD LEFT ARM  Result Value Ref Range Status   Specimen Description   Final    BLOOD LEFT ARM Performed at [facility], [address] [place], [place] [address]    Special Requests   Final    BOTTLES DRAWN AEROBIC AND ANAEROBIC Blood Culture adequate volume Performed at [facility], [address] [place], [place] [address]    Culture   Final    NO GROWTH 2 DAYS Performed at [facility], [address], [place], [place] [address]    Report Status PENDING  Incomplete  Urine Culture     Status: Abnormal   Collection Time: 02/02/23  8:02 PM   Specimen: Urine, Clean Catch  Result Value Ref Range Status   Specimen Description   Final    URINE, CLEAN CATCH Performed at [facility], [address] [place], [place] [address]     Special Requests   Final    NONE Performed at  [facility], [address] [place], [place] [address]    Culture MULTIPLE SPECIES PRESENT, SUGGEST RECOLLECTION (A)  Final   Report Status 02/04/2023 FINAL  Final    Time coordinating discharge: 45 minutes  Signed:    [facility] 02/04/2023, 3:05 PM

## 2023-02-04 NOTE — Plan of Care (Signed)

## 2023-02-04 NOTE — Plan of Care (Signed)
  Problem: Clinical Measurements: Goal: Ability to maintain clinical measurements within normal limits will improve Outcome: Progressing   

## 2023-02-06 ENCOUNTER — Ambulatory Visit: Payer: Medicaid Other | Admitting: Family Medicine

## 2023-02-07 ENCOUNTER — Telehealth: Payer: Self-pay | Admitting: Family Medicine

## 2023-02-07 ENCOUNTER — Telehealth: Payer: Self-pay | Admitting: Orthopedic Surgery

## 2023-02-07 NOTE — Telephone Encounter (Signed)
Patient called wanting to see if she able to get another injection or something else.CB#[phone]

## 2023-02-07 NOTE — Telephone Encounter (Signed)
Pt called stating that she had some concerns regarding her recent hospital stay that she wanted to talk with someone about and she didn't want to leave them till the weekend.

## 2023-02-07 NOTE — Telephone Encounter (Signed)
Spoke with patient about hospital visits. She is confused about medications. She states she stopped all her medications, she is only taking reflux medication and antibiotic. It does look like patient was told to stop blood pressure medication, check readings at home, and restart Metoprolol if over 140. Patient has been off medication. She checked blood pressure last night, can not remember reading, but said it was high. She is having headaches again. I advised to restart Metoprolol and check blood pressures twice daily until appointment next week. She will go back to ED if symptoms worsen.

## 2023-02-10 NOTE — Telephone Encounter (Signed)
Noted! Thank you

## 2023-02-13 ENCOUNTER — Ambulatory Visit: Payer: Medicaid Other | Admitting: Family Medicine

## 2023-02-13 ENCOUNTER — Encounter: Payer: Self-pay | Admitting: Family Medicine

## 2023-02-13 VITALS — BP 160/90 | HR 89 | Ht 60.0 in | Wt 197.0 lb

## 2023-02-13 DIAGNOSIS — G43909 Migraine, unspecified, not intractable, without status migrainosus: Secondary | ICD-10-CM | POA: Diagnosis not present

## 2023-02-13 DIAGNOSIS — I10 Essential (primary) hypertension: Secondary | ICD-10-CM | POA: Diagnosis not present

## 2023-02-13 DIAGNOSIS — R42 Dizziness and giddiness: Secondary | ICD-10-CM

## 2023-02-13 DIAGNOSIS — Z09 Encounter for follow-up examination after completed treatment for conditions other than malignant neoplasm: Secondary | ICD-10-CM | POA: Diagnosis not present

## 2023-02-13 MED ORDER — LISINOPRIL 40 MG PO TABS
40.0000 mg | ORAL_TABLET | Freq: Every day | ORAL | Status: DC
Start: 2023-02-13 — End: 2023-06-04

## 2023-02-13 NOTE — Assessment & Plan Note (Signed)
Blood pressure is not at goal for age and co-morbidities.   Recommendations: continue metoprolol, restart lisinopril  - BP goal <130/80 - monitor and log blood pressures at home - check around the same time each day in a relaxed setting - Limit salt to <2000 mg/day - Follow DASH eating plan (heart healthy diet) - limit alcohol to 2 standard drinks per day for men and 1 per day for women - avoid tobacco products - get at least 2 hours of regular aerobic exercise weekly Patient aware of signs/symptoms requiring further/urgent evaluation. Labs updated today. Follow-up in 2 weeks. If still high, we may add-back the HCTZ.

## 2023-02-13 NOTE — Patient Instructions (Signed)
Blood pressure is not at goal for age and co-morbidities.   Recommendations: continue metoprolol, restart lisinopril  - BP goal <130/80 - monitor and log blood pressures at home - check around the same time each day in a relaxed setting - Limit salt to <2000 mg/day - Follow DASH eating plan (heart healthy diet) - limit alcohol to 2 standard drinks per day for men and 1 per day for women - avoid tobacco products - get at least 2 hours of regular aerobic exercise weekly Patient aware of signs/symptoms requiring further/urgent evaluation. Labs updated today. Follow-up in 2 weeks. If still high, we may add-back the HCTZ.  New neurology referral for your migraines, dizziness.

## 2023-02-13 NOTE — Progress Notes (Signed)
Home blood pressure readings:  01/29/2023 130/94  93   117/73  88 01/30/2023 135/84  90   110/74  78 02/01/2023 108/66  120   118/103 128 02/02/2023 251/84     238/59  110

## 2023-02-13 NOTE — Progress Notes (Signed)
Established Patient Office Visit  Subjective   Patient ID: Lauren Soto, female    DOB: [birth year]  Age: 52 y.o. MRN: [id number]  Chief Complaint  Patient presents with   Medical Management of Chronic Issues   Hypertension    Discussed the use of AI scribe software for clinical note transcription with the patient, who gave verbal consent to proceed.  History of Present Illness   The patient, with a history of hypertension, diabetes, and vertigo, was recently hospitalized for a urinary tract infection (UTI) complicated by sepsis. They were also diagnosed with dehydration, which was thought to have contributed to an acute kidney injury. The patient completed a course of antibiotics for the UTI.  Prior to the hospitalization, the patient had a syncopal episode, which led to an emergency department visit. They were discharged but returned to the hospital a few days later due to persistent dizziness. The dizziness was initially attributed to vertigo, but the patient reported that the symptoms felt different and more severe than their usual vertigo episodes. They were also diagnosed with a sinus infection during the hospital stay.  Post-discharge, the patient reported persistent headaches and ear pain. They also described episodes of room spinning and needing to close their eyes to regain balance. These symptoms were suggestive of ongoing vertigo. She has started noticing some improvement the past 2-3 days.  The patient's blood pressure readings at home have been high. They were advised to hold off on their antihypertensive medications during the hospital stay due to dehydration and potential hypotension. Currently, they are only taking metoprolol.            ROS All review of systems negative except what is listed in the HPI    Objective:     BP (!) 160/90   Pulse 89   Ht 5' (1.524 m)   Wt 197 lb (89.4 kg)   LMP 06/04/2012   SpO2 100%   BMI 38.47 kg/m    Physical  Exam Vitals reviewed.  Constitutional:      Appearance: Normal appearance.  Cardiovascular:     Rate and Rhythm: Normal rate and regular rhythm.     Heart sounds: Normal heart sounds.  Pulmonary:     Effort: Pulmonary effort is normal.     Breath sounds: Normal breath sounds.  Skin:    General: Skin is warm and dry.  Neurological:     Mental Status: She is alert and oriented to person, place, and time.  Psychiatric:        Mood and Affect: Mood normal.        Behavior: Behavior normal.        Thought Content: Thought content normal.        Judgment: Judgment normal.      No results found for any visits on 02/13/23.    The 10-year ASCVD risk score (Arnett DK, et al., 2019) is: 17.7%    Assessment & Plan:   Problem List Items Addressed This Visit     Essential hypertension    Blood pressure is not at goal for age and co-morbidities.   Recommendations: continue metoprolol, restart lisinopril  - BP goal <130/80 - monitor and log blood pressures at home - check around the same time each day in a relaxed setting - Limit salt to <2000 mg/day - Follow DASH eating plan (heart healthy diet) - limit alcohol to 2 standard drinks per day for men and 1 per day for women - avoid tobacco  products - get at least 2 hours of regular aerobic exercise weekly Patient aware of signs/symptoms requiring further/urgent evaluation. Labs updated today. Follow-up in 2 weeks. If still high, we may add-back the HCTZ.      Relevant Medications   metoprolol succinate (TOPROL-XL) 50 MG 24 hr tablet   lisinopril (ZESTRIL) 40 MG tablet   Other Relevant Orders   CBC with Differential/Platelet   Comprehensive metabolic panel   Other Visit Diagnoses     Migraine without status migrainosus, not intractable, unspecified migraine type    -  Primary Patient reports ongoing migraines. -Referral to neurology for further management.    Relevant Medications   metoprolol succinate (TOPROL-XL) 50 MG  24 hr tablet   lisinopril (ZESTRIL) 40 MG tablet   Other Relevant Orders   Ambulatory referral to Neurology   Dizziness     Patient reports worsening vertigo, possibly exacerbated by recent sinus infection. -Continue Meclizine as needed for vertigo symptoms.    Relevant Orders   Ambulatory referral to Neurology   Hospital discharge follow-up     Patient was hospitalized from August 4th to August 6th for UTI and sepsis. She was treated with antibiotics and fluids for dehydration. Kidney function was off but improved with hydration. -Check kidney function today to ensure stability. -Ensure adequate hydration to prevent further episodes.        Return in about 2 weeks (around 02/27/2023) for htn follow-up.    Clayborne Dana, NP

## 2023-02-14 ENCOUNTER — Encounter: Payer: Self-pay | Admitting: Neurology

## 2023-02-14 LAB — COMPREHENSIVE METABOLIC PANEL
ALT: 15 U/L (ref 0–35)
AST: 17 U/L (ref 0–37)
Albumin: 4.2 g/dL (ref 3.5–5.2)
Alkaline Phosphatase: 143 U/L — ABNORMAL HIGH (ref 39–117)
BUN: 7 mg/dL (ref 6–23)
CO2: 26 meq/L (ref 19–32)
Calcium: 9.6 mg/dL (ref 8.4–10.5)
Chloride: 99 meq/L (ref 96–112)
Creatinine, Ser: 0.82 mg/dL (ref 0.40–1.20)
GFR: 82.78 mL/min (ref >60.00)
Glucose, Bld: 138 mg/dL — ABNORMAL HIGH (ref 70–99)
Potassium: 3.4 meq/L — ABNORMAL LOW (ref 3.5–5.1)
Sodium: 136 meq/L (ref 135–145)
Total Bilirubin: 0.4 mg/dL (ref 0.2–1.2)
Total Protein: 7.3 g/dL (ref 6.0–8.3)

## 2023-02-14 LAB — CBC WITH DIFFERENTIAL/PLATELET
Basophils Absolute: 0.1 10*3/uL (ref 0.0–0.1)
Basophils Relative: 1 % (ref 0.0–3.0)
Eosinophils Absolute: 0.2 10*3/uL (ref 0.0–0.7)
Eosinophils Relative: 1.8 % (ref 0.0–5.0)
HCT: 40.3 % (ref 36.0–46.0)
Hemoglobin: 12.9 g/dL (ref 12.0–15.0)
Lymphocytes Relative: 24.4 % (ref 12.0–46.0)
Lymphs Abs: 2.2 10*3/uL (ref 0.7–4.0)
MCHC: 32 g/dL (ref 30.0–36.0)
MCV: 86.8 fl (ref 78.0–100.0)
Monocytes Absolute: 0.5 10*3/uL (ref 0.1–1.0)
Monocytes Relative: 5.8 % (ref 3.0–12.0)
Neutro Abs: 5.9 10*3/uL (ref 1.4–7.7)
Neutrophils Relative %: 67 % (ref 43.0–77.0)
Platelets: 346 10*3/uL (ref 150.0–400.0)
RBC: 4.65 Mil/uL (ref 3.87–5.11)
RDW: 15.4 % (ref 11.5–15.5)
WBC: 8.8 10*3/uL (ref 4.0–10.5)
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
## 2023-02-25 ENCOUNTER — Telehealth: Payer: Self-pay

## 2023-02-25 ENCOUNTER — Encounter: Payer: Self-pay | Admitting: Neurology

## 2023-02-25 ENCOUNTER — Ambulatory Visit (INDEPENDENT_AMBULATORY_CARE_PROVIDER_SITE_OTHER): Payer: Medicaid Other | Admitting: Neurology

## 2023-02-25 VITALS — BP 154/102 | HR 87 | Ht 60.0 in | Wt 194.0 lb

## 2023-02-25 DIAGNOSIS — G43809 Other migraine, not intractable, without status migrainosus: Secondary | ICD-10-CM | POA: Diagnosis not present

## 2023-02-25 DIAGNOSIS — G43019 Migraine without aura, intractable, without status migrainosus: Secondary | ICD-10-CM

## 2023-02-25 MED ORDER — MECLIZINE HCL 25 MG PO TABS: 25.0000 mg | ORAL_TABLET | Freq: Three times a day (TID) | ORAL | 5 refills | Status: DC | PRN

## 2023-02-25 MED ORDER — RIZATRIPTAN BENZOATE 10 MG PO TBDP: 10.0000 mg | ORAL_TABLET | ORAL | 5 refills | Status: DC | PRN

## 2023-02-25 MED ORDER — AJOVY 225 MG/1.5ML ~~LOC~~ SOAJ: 225.0000 mg | SUBCUTANEOUS | 11 refills | Status: DC

## 2023-02-25 MED ORDER — ONDANSETRON 4 MG PO TBDP: 4.0000 mg | ORAL_TABLET | Freq: Three times a day (TID) | ORAL | 5 refills | Status: DC | PRN

## 2023-02-25 NOTE — Patient Instructions (Signed)
Start Ajovy injection every 28 days At earliest onset of migraine attack, take rizatriptan.  May repeat after 2 hours.  Maximum 2 in 24 hours.  At earliest onset, take ondansetron for nausea.  Take meclizine as needed for vertigo but use sparingly Limit use of pain relievers to no more than 2 days out of week to prevent risk of rebound or medication-overuse headache. Keep headache diary Keep hydrated Do not skip meals Modify diet regarding possible food triggers.

## 2023-02-25 NOTE — Telephone Encounter (Signed)
PA team please start PA for Ajovy 225mg 

## 2023-02-27 ENCOUNTER — Encounter: Payer: Self-pay | Admitting: Family Medicine

## 2023-02-27 ENCOUNTER — Ambulatory Visit: Payer: Medicaid Other | Admitting: Family Medicine

## 2023-02-27 ENCOUNTER — Ambulatory Visit (INDEPENDENT_AMBULATORY_CARE_PROVIDER_SITE_OTHER): Payer: Medicaid Other | Admitting: Family Medicine

## 2023-02-27 ENCOUNTER — Ambulatory Visit: Payer: Medicaid Other | Admitting: Orthopedic Surgery

## 2023-02-27 VITALS — BP 155/96 | HR 95 | Ht 60.0 in | Wt 192.0 lb

## 2023-02-27 DIAGNOSIS — I10 Essential (primary) hypertension: Secondary | ICD-10-CM | POA: Diagnosis not present

## 2023-02-27 DIAGNOSIS — R079 Chest pain, unspecified: Secondary | ICD-10-CM

## 2023-02-27 DIAGNOSIS — M5416 Radiculopathy, lumbar region: Secondary | ICD-10-CM

## 2023-02-27 LAB — EKG 12-LEAD

## 2023-02-27 MED ORDER — MELOXICAM 15 MG PO TABS: 15.0000 mg | ORAL_TABLET | Freq: Every day | ORAL | 0 refills | Status: DC

## 2023-02-27 MED ORDER — NITROGLYCERIN 0.4 MG SL SUBL
0.4000 mg | SUBLINGUAL_TABLET | SUBLINGUAL | 3 refills | Status: AC | PRN
Start: 2023-02-27 — End: ?

## 2023-02-27 MED ORDER — AMLODIPINE BESYLATE 2.5 MG PO TABS
2.5000 mg | ORAL_TABLET | Freq: Every day | ORAL | 0 refills | Status: DC
Start: 2023-02-27 — End: 2023-03-19

## 2023-02-27 MED ORDER — PREGABALIN 50 MG PO CAPS: ORAL_CAPSULE | ORAL | 0 refills | Status: DC

## 2023-02-27 NOTE — Progress Notes (Signed)
Established Patient Office Visit  Subjective   Patient ID: Lauren Soto, female    DOB: 08-25-1970  Age: 52 y.o. MRN: [id number]  Chief Complaint  Patient presents with   Hypertension    Hypertension    Patient is here for blood pressure follow-up.   Hypertension: - Medications: lisinopril 40 mg daily, metoprolol succinate 50 mg daily - Compliance: good - Checking BP at home: around 140/100 at home - Denies any recurrent headaches, vision changes, LE edema, dizziness, palpitations, or medication side effects. - Reports she is having chest pain almost daily for the past few weeks. Pain occurs at rest and with activity. Feels like a fluttering pressure, usually lasting 5-10 minutes and resolved after resting and drinking some water. Pain is not reproducible with palpation. Pain is associated with shortness of breath.  - Diet: heart healthy - Exercise: minimal - Stressors: recently lost her job - Appointment to see Dr. Tomie China for first cardiology visit in about 3 weeks.        ROS All review of systems negative except what is listed in the HPI    Objective:     BP (!) 155/96   Pulse 95   Ht 5' (1.524 m)   Wt 192 lb (87.1 kg)   LMP 06/04/2012   SpO2 96%   BMI 37.50 kg/m    Physical Exam Vitals reviewed.  Constitutional:      Appearance: Normal appearance.  Cardiovascular:     Rate and Rhythm: Normal rate and regular rhythm.     Heart sounds: Normal heart sounds.  Pulmonary:     Effort: Pulmonary effort is normal.     Breath sounds: Normal breath sounds.  Skin:    General: Skin is warm and dry.  Neurological:     Mental Status: She is alert and oriented to person, place, and time.  Psychiatric:        Mood and Affect: Mood normal.        Behavior: Behavior normal.        Thought Content: Thought content normal.        Judgment: Judgment normal.      Results for orders placed or performed in visit on 02/27/23  EKG 12-Lead  Result Value  Ref Range   EKG 12 lead        The 10-year ASCVD risk score (Arnett DK, et al., 2019) is: 15.8%    Assessment & Plan:   Problem List Items Addressed This Visit       Active Problems   Essential hypertension - Primary   Relevant Medications   amLODipine (NORVASC) 2.5 MG tablet   nitroGLYCERIN (NITROSTAT) 0.4 MG SL tablet   Other Relevant Orders   EKG 12-Lead (Completed)   Other Visit Diagnoses     Chest pain, unspecified type       Relevant Medications   nitroGLYCERIN (NITROSTAT) 0.4 MG SL tablet   Other Relevant Orders   EKG 12-Lead (Completed)     EKG similar to previous, non-specific T wave changes, no ST elevation Adding amlodipine for blood pressure control. Cardiology to recheck and adjust at upcoming appointment.  Adding as needed sublingual (under the tongue) nitroglycerin for chest pain episodes. Repeat after 5 minutes (if you have to use 3 times back to back, you need to call 911). Keep upcoming cardiology appointment in 2-3 weeks.   Patient aware of signs/symptoms requiring further/urgent evaluation.     Return in about 3 months (around 05/30/2023) for routine  follow-up.    Clayborne Dana, NP

## 2023-02-27 NOTE — Patient Instructions (Addendum)
EKG similar to previous, no new alarming findings Adding amlodipine for blood pressure control. Cardiology to recheck and adjust at upcoming appointment.  Adding as needed sublingual (under the tongue) nitroglycerin for acute chest pain. Repeat after 5 minutes (if you have to use 3 times back to back, you need to call 911). Keep upcoming cardiology appointment.

## 2023-03-01 ENCOUNTER — Encounter: Payer: Self-pay | Admitting: Orthopedic Surgery

## 2023-03-01 NOTE — Progress Notes (Signed)
Office Visit Note   Patient: Lauren Soto           Date of Birth: 09-10-1970           MRN: [id number] Visit Date: 02/27/2023 Requested by: Clayborne Dana, NP [address] [place],  [place] [address] PCP: Clayborne Dana, NP  Subjective: Chief Complaint  Patient presents with   Lower Back - Pain    HPI: Lauren Soto is a 52 y.o. female who presents to the office reporting low back pain.  Since the patient was last seen she had an epidural steroid injection with Dr. Alvester Morin on 01/21/2023.  Reports continued pain in her back hip and leg.  Pain does radiate down to the right calf.  Injection did help her from 1 to 2 days.  She describes limping but she is able to sit and stand.  Reports mild groin pain as well.  Symptoms for a year.  Was on Mobic and Lyrica..                ROS: All systems reviewed are negative as they relate to the chief complaint within the history of present illness.  Patient denies fevers or chills.  Assessment & Plan: Visit Diagnoses:  1. Lumbar radiculopathy     Plan: Impression is low back pain with minimal improvement from epidural steroid injection.  Plan is to refill Mobic and Lyrica 1 time and start physical therapy in [place].  Right radiculopathy with stretching and strengthening.  1-2 times a week for 6 weeks if possible.  Follow-up as needed.  Follow-Up Instructions: No follow-ups on file.   Orders:  Orders Placed This Encounter  Procedures   Ambulatory referral to Physical Therapy   Meds ordered this encounter  Medications   DISCONTD: meloxicam (MOBIC) 15 MG tablet    Sig: Take 1 tablet (15 mg total) by mouth daily.    Dispense:  30 tablet    Refill:  0   DISCONTD: pregabalin (LYRICA) 50 MG capsule    Sig: 1 po bid prn    Dispense:  60 capsule    Refill:  0      Procedures: No procedures performed   Clinical Data: No additional findings.  Objective: Vital Signs: LMP 06/04/2012   Physical  Exam:  Constitutional: Patient appears well-developed HEENT:  Head: Normocephalic Eyes:EOM are normal Neck: Normal range of motion Cardiovascular: Normal rate Pulmonary/chest: Effort normal Neurologic: Patient is alert Skin: Skin is warm Psychiatric: Patient has normal mood and affect  Ortho Exam: Ortho exam demonstrates mild nerve root tension signs on the right mild paresthesias in the right sided L4-5 distribution.  5 out of 5 ankle dorsiflexion plantarflexion quad hamstring strength along with hip flexion strength.  No trochanteric tenderness.  Pedal pulses palpable.  Reflexes symmetric.  Specialty Comments:  MRI LUMBAR SPINE WITHOUT CONTRAST   TECHNIQUE: Multiplanar, multisequence MR imaging of the lumbar spine was performed. No intravenous contrast was administered.   COMPARISON:  Lumbar radiographs 11/18/2022.   FINDINGS: Segmentation:  Normal on the comparison.   Alignment: Normal to mildly exaggerated lumbar lordosis. Subtle grade 1 anterolisthesis of L4 on L5 (only 2 mm on these images) was more conspicuous on the radiographs. No significant scoliosis.   Vertebrae: Visualized bone marrow signal is within normal limits. No marrow edema or evidence of acute osseous abnormality. Intact visible sacrum.   Conus medullaris and cauda equina: Conus extends to the L1 level. No lower  spinal cord or conus signal abnormality.   Paraspinal and other soft tissues: Negative; large body habitus.   Disc levels:   Multilevel lower thoracic disc desiccation and disc bulging is partially visible (series 3, image 9). Up to mild lower thoracic spinal stenosis at the level of the lower spinal cord (T11-T12:), but no definite cord mass effect or signal abnormality.   T12-L1:  Negative.   L1-L2:  Negative.   L2-L3:  Negative disc.  Mild facet hypertrophy.  No stenosis.   L3-L4: Negative disc. Mild right foraminal disc bulge and endplate spurring. Otherwise negative disc. Mild to  moderate facet hypertrophy. Borderline to mild right L3 foraminal stenosis.   L4-L5: Subtle anterolisthesis. Moderate facet and ligament flavum hypertrophy. Degenerative facet joint fluid. No spinal stenosis. Up to mild lateral recess stenosis (L5 nerve levels) and left L4 neural foraminal stenosis.   L5-S1: Subtle disc desiccation. Broad-based central to right paracentral disc protrusion (series 6, image 38 and series 3, image 8). Mild facet hypertrophy. No spinal stenosis. Mild mass effect on the descending right S1 nerve roots in the lateral recess. Borderline to mild bilateral L5 foraminal stenosis.   IMPRESSION: 1. Subtle anterolisthesis at L4-L5 with at least moderate facet arthropathy. No spinal stenosis but mild lateral recess and left foraminal stenosis at that level. 2. L5-S1 disc degeneration with a small rightward disc herniation most affecting the right lateral recess. Query right S1 radiculitis. 3. Partially visible lower thoracic disc degeneration with up to mild lower thoracic spinal stenosis. Visible lower spinal cord at those levels seems to remain normal.     Electronically Signed   By: Odessa Fleming M.D.  Imaging: No results found.   PMFS History: Patient Active Problem List   Diagnosis Date Noted   Severe sepsis (HCC) 02/03/2023   Postural dizziness with presyncope 02/03/2023   GERD (gastroesophageal reflux disease) 02/03/2023   Acute cystitis 02/03/2023   AKI (acute kidney injury) (HCC) 02/02/2023   Long term current use of oral hypoglycemic drug 11/06/2022   Chronic right hip pain 11/06/2022   Hyperlipidemia 11/06/2022   Vestibular migraine 07/06/2018   Non-insulin dependent type 2 diabetes mellitus (HCC) 06/18/2018   Asthma 04/21/2014   Menorrhagia 06/08/2012   Pelvic pain in female 06/08/2012   Essential hypertension 05/25/2012   Uterine fibroid 05/25/2012   Past Medical History:  Diagnosis Date   Diabetes mellitus without complication (HCC)     Hypertension    Vertigo     Family History  Problem Relation Age of Onset   Neuropathy Mother    Dementia Mother    Hypertension Mother    Multiple myeloma Mother    Arthritis Mother    Dementia Father    Hypertension Father     Past Surgical History:  Procedure Laterality Date   ABDOMINAL HYSTERECTOMY     BUNIONECTOMY     CHOLECYSTECTOMY     HERNIA REPAIR     umblical   SHOULDER ARTHROSCOPY     Social History   Occupational History   Not on file  Tobacco Use   Smoking status: Former    Current packs/day: 0.00    Types: Cigarettes    Quit date: 2009    Years since quitting: 15.6   Smokeless tobacco: Never   Tobacco comments:    "Every once in awhile will vape"  Vaping Use   Vaping status: Former   Quit date: 07/04/2022  Substance and Sexual Activity   Alcohol use: No   Drug use:  No   Sexual activity: Yes    Birth control/protection: Surgical

## 2023-03-05 ENCOUNTER — Encounter: Payer: Self-pay | Admitting: Physical Therapy

## 2023-03-05 ENCOUNTER — Ambulatory Visit: Payer: Medicaid Other | Attending: Orthopedic Surgery | Admitting: Physical Therapy

## 2023-03-05 DIAGNOSIS — M25551 Pain in right hip: Secondary | ICD-10-CM

## 2023-03-05 DIAGNOSIS — R252 Cramp and spasm: Secondary | ICD-10-CM

## 2023-03-05 DIAGNOSIS — M6281 Muscle weakness (generalized): Secondary | ICD-10-CM

## 2023-03-05 DIAGNOSIS — M5416 Radiculopathy, lumbar region: Secondary | ICD-10-CM

## 2023-03-05 NOTE — Telephone Encounter (Signed)
LMOVM will check with PA team To check to status of the PA.   Will ask Dr.Jaffe about the Lyrica.

## 2023-03-05 NOTE — Telephone Encounter (Signed)
Pt is calling in to check the status of the Ajovy (PA) and to see if she is to still suppose to be taking the pregabalin  (LYRICA)100 MG.  Pharm: [facility] in [place] on [address]  Pt would like to have a call back.

## 2023-03-05 NOTE — Therapy (Signed)
OUTPATIENT PHYSICAL THERAPY THORACOLUMBAR EVALUATION   Patient Name: FAUNA DEISHER MRN: [id number] DOB:07-05-1970, 52 y.o., female Today's Date: 03/05/2023  END OF SESSION:  PT End of Session - 03/05/23 1345     Visit Number 1    Date for PT Re-Evaluation 04/16/23    Authorization Type [employer] Medicaid    PT Start Time 1020    PT Stop Time 1108    PT Time Calculation (min) 48 min    Activity Tolerance Patient tolerated treatment well    Behavior During Therapy WFL for tasks assessed/performed             Past Medical History:  Diagnosis Date   Diabetes mellitus without complication (HCC)    Hypertension    Vertigo    Past Surgical History:  Procedure Laterality Date   ABDOMINAL HYSTERECTOMY     BUNIONECTOMY     CHOLECYSTECTOMY     HERNIA REPAIR     umblical   SHOULDER ARTHROSCOPY     Patient Active Problem List   Diagnosis Date Noted   Severe sepsis (HCC) 02/03/2023   Postural dizziness with presyncope 02/03/2023   GERD (gastroesophageal reflux disease) 02/03/2023   Acute cystitis 02/03/2023   AKI (acute kidney injury) (HCC) 02/02/2023   Long term current use of oral hypoglycemic drug 11/06/2022   Chronic right hip pain 11/06/2022   Hyperlipidemia 11/06/2022   Vestibular migraine 07/06/2018   Non-insulin dependent type 2 diabetes mellitus (HCC) 06/18/2018   Asthma 04/21/2014   Menorrhagia 06/08/2012   Pelvic pain in female 06/08/2012   Essential hypertension 05/25/2012   Uterine fibroid 05/25/2012    PCP: Clayborne Dana, NP   REFERRING PROVIDER: Cammy Copa, MD   REFERRING DIAG: M54.16 (ICD-10-CM) - Lumbar radiculopathy   Rationale for Evaluation and Treatment: Rehabilitation  THERAPY DIAG:  Radiculopathy, lumbar region  Cramp and spasm  Muscle weakness (generalized)  Pain in right hip  ONSET DATE: chronic low back pain years, Leg pain started November 2023.   SUBJECTIVE:                                                                                                                                                                                            SUBJECTIVE STATEMENT: Pain has been ongoing for about a year.  Pain is in back, right hip and leg radiating to down R calf, mild pain into groin as well. Had an epidural steroid injection with Dr. Alvester Morin on 01/21/2023, which helped 1-2 days.  Also was given a cortisone injection in her hip on 11/29/22.  It didn't help either.  Pain seems to be spreading  and she's not sure if its coming from her back or hip.  Chronic back pain has been going on for years, has been checked for RA and that was ruled out.  The leg pain started in November.   PERTINENT HISTORY:  T2DM, HTN, vertigo & migraines, chronic LBP, history of abdominal hysterectomy and umbilical hernia repair.   PAIN:  Are you having pain? Yes: NPRS scale: 9/10 Pain location: midline low back, radiates up, and down Right leg into groin, aldo buttock down to knee, sometimes to calf.  Pain description: throbbing, achey, sharp in groin Aggravating factors: pain increases past 10/10, increased activity, moving a lot or prolonged sitting Relieving factors: sometimes a heating pad  PRECAUTIONS: None  RED FLAGS: None   WEIGHT BEARING RESTRICTIONS: No  FALLS:  Has patient fallen in last 6 months? No however R knee occasionally buckles/feels weak.   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 Subtle anterolisthesis at L4-L5 with at least moderate facet arthropathy. No spinal stenosis but mild lateral recess and left foraminal stenosis at that level. 2. L5-S1 disc degeneration with a small rightward  disc herniation most affecting the right lateral recess. Query right S1 radiculitis. 3. Partially visible lower thoracic disc degeneration with up to mild lower thoracic spinal stenosis. Visible lower spinal cord at those levels seems to remain normal.  11/24/22 MR Pelvis IMPRESSION: Moderate tendinosis with partial tearing of the proximal hamstring tendons bilaterally.   Mild tendinosis of the distal gluteus minimus tendons bilaterally with associated peritrochanteric edema, left greater than right.   Mild osteoarthritis of the hips.  PATIENT SURVEYS:  Modified Oswestry 27/50= 54% severe   COGNITION: Overall cognitive status: Within functional limits for tasks assessed     SENSATION: Not tested  MUSCLE LENGTH: Hamstrings: severe tightness bil  Quadriceps: severe tightness bil   POSTURE:  sits with weight shift to left  PALPATION: Tenderness with PA mob to L3-5, sacrum, bil SIJ tenderness throughout lumbar paraspinals, QL R gluteals, piriformis and greater trochanter,   LUMBAR ROM:   AROM eval  Flexion To knees, p!  Extension Limited 90!, p!  Right lateral flexion To mid thigh P!  Left lateral flexion To mid thigh p!  Right rotation Limited 50%  Left rotation Limited 50%   (Blank rows = not tested)  LOWER EXTREMITY ROM:   good hip IR/ER in R hip but painful, decreased hip IR on L compared to R   LOWER EXTREMITY MMT:    MMT Right eval Left eval  Hip flexion 3+ 4+  Hip extension 4- 4-  Hip abduction 4+ 5  Hip adduction 4+ 4+  Knee flexion 4 5  Knee extension 4+ 5  Ankle dorsiflexion 5 5  Ankle plantarflexion 5 5   (Blank rows = not tested)  LUMBAR SPECIAL TESTS:  Straight leg raise test: positive on R and cross straight leg, Slump test: Positive, and FABER test: Positive  FUNCTIONAL TESTS:   GAIT: Distance walked: 54' Assistive device utilized: None Level of assistance: Complete Independence Comments: visually slow, no significant deviation but does  report intermittent R knee buckling.   TODAY'S TREATMENT:  DATE:   03/05/2023 Self Care:      Findings, POC, Pain Neuroscience Education.  Patient educated on the concept of the nervous system as the bodies alarm system, and the role of nociception to warn the body of danger.  Peripheral nerve sensitization, hyperalgesia and allodynia were explained using metaphors to promote deep learning.  Patient educated on the concept of neuroplasticity and how factors such as temperature, stress, movement, immunity and blood flow affect pain via channel expression.  Role of exercise helping decrease sensitivity.   Initial HEP given: prone lying and prone knee bends.  Also discussed soda pop metaphor for core for need for core strengthening due to history of abdominal surgery.     PATIENT EDUCATION:  Education details: see self care, initial HEP.  Person educated: Patient Education method: Explanation Education comprehension: verbalized understanding and returned demonstration  HOME EXERCISE PROGRAM: TBD  ASSESSMENT:  CLINICAL IMPRESSION: Yma Mcaloon Steed-Matthews  is a 52 y.o. female who was seen today for physical therapy evaluation and treatment for lumbar radiculopathy.    Patient presents with low back pain with right sided radicular symptoms which are interfering with ADLs and are impacting quality of life.  She also demonstrates weakness in R hip, and reports intermittent buckling in R knee.  Noted today pain with R hip IR/ER (with positive FABER and Scour tests) and tenderness over greater trochanter, so unable to rule out R hip contributing to back pain as well.  On Modified Oswestry patient scored 54% demonstrating severe disability.   Patient will benefit from skilled physical therapy services to help reach the maximal level of functional independence and mobility. Patient  demonstrates understanding of this plan of care and is in agreement with this plan.   She was given initial HEP today for prone on elbows and prone knee bends as tolerated this position well and reported decreased discomfort.    OBJECTIVE IMPAIRMENTS: decreased activity tolerance, decreased endurance, decreased mobility, difficulty walking, decreased ROM, decreased strength, increased fascial restrictions, impaired perceived functional ability, increased muscle spasms, impaired flexibility, postural dysfunction, and pain.   ACTIVITY LIMITATIONS: carrying, lifting, bending, sitting, standing, squatting, sleeping, transfers, bed mobility, and locomotion level  PARTICIPATION LIMITATIONS: meal prep, cleaning, laundry, shopping, community activity, and occupation  PERSONAL FACTORS: Time since onset of injury/illness/exacerbation and 3+ comorbidities: T2DM, HTN, vertigo & migraines, chronic LBP, history of abdominal hysterectomy and umbilical hernia repair.  are also affecting patient's functional outcome.   REHAB POTENTIAL: Good  CLINICAL DECISION MAKING: Evolving/moderate complexity  EVALUATION COMPLEXITY: Moderate   GOALS: Goals reviewed with patient? Yes  SHORT TERM GOALS: Target date: 03/19/2023   Patient will be independent with initial HEP.  Baseline:  Goal status: INITIAL   LONG TERM GOALS: Target date: 04/16/2023   Patient will be independent with advanced/ongoing HEP to improve outcomes and carryover.  Baseline:  Goal status: INITIAL  2.  Patient will report 75% improvement in low back pain to improve QOL.  Baseline:  Goal status: INITIAL  3.  Patient will demonstrate full pain free lumbar ROM to perform ADLs.   Baseline: see objective Goal status: INITIAL  4.  Patient will demonstrate improved functional strength as demonstrated by 4+/5 R hip strength. Baseline: see objective Goal status: INITIAL  5.  Patient will report at least 6 points improvement on modified  Oswestry to demonstrate improved functional ability.  Baseline: 27/50 Goal status: INITIAL   6.  Patient will report being able to sleep without interruption from LBP to improve  QOL. Baseline: reports sleeping only 1/4 normal amount due to pain Goal status: INITIAL  7.   Patient will report centralization of radicular symptoms.  Baseline: radiates down RLE to calf and groin Goal status: INITIAL  PLAN:  PT FREQUENCY: 1-2x/week  PT DURATION: 6 weeks  PLANNED INTERVENTIONS: Therapeutic exercises, Therapeutic activity, Neuromuscular re-education, Balance training, Gait training, Patient/Family education, Self Care, Joint mobilization, Joint manipulation, Orthotic/Fit training, Dry Needling, Electrical stimulation, Spinal manipulation, Spinal mobilization, Cryotherapy, Moist heat, Taping, Traction, Ultrasound, Manual therapy, and Re-evaluation.  PLAN FOR NEXT SESSION: review and progress exercises - McKenzie extension if tolerated, Neutral spine for core strengthening, manual therapy - TrDN if willing to lumbar multifidi and R glutes, modalities PRN.    Jena Gauss, PT, DPT 03/05/2023, 1:47 PM

## 2023-03-06 ENCOUNTER — Other Ambulatory Visit: Payer: Self-pay | Admitting: Surgical

## 2023-03-06 ENCOUNTER — Telehealth: Payer: Self-pay | Admitting: Orthopedic Surgery

## 2023-03-06 MED ORDER — PREGABALIN 100 MG PO CAPS: 100.0000 mg | ORAL_CAPSULE | Freq: Every day | ORAL | 0 refills | Status: DC

## 2023-03-06 NOTE — Telephone Encounter (Signed)
Sent in

## 2023-03-06 NOTE — Telephone Encounter (Signed)
Patient advised of Dr.Jaffe note, She reported that she wasn't taking Lyrica    Per patient she thinks her orthopedic doctor discussed restarting the Lyrica.  Will let us know. She did reach out to the Orthopedic.

## 2023-03-06 NOTE — Telephone Encounter (Signed)
Pt called requesting a refill of pregabalin. Pt states she was advised to start taking again but do not have many more. Please send to [facility] [address]. Pt phone number is [phone].

## 2023-03-06 NOTE — Telephone Encounter (Signed)
Called and advised.

## 2023-03-07 ENCOUNTER — Telehealth: Payer: Self-pay | Admitting: Neurology

## 2023-03-07 NOTE — Telephone Encounter (Signed)
[facility] pharam[facility] called stating Patient is needing a PA for Ajovy

## 2023-03-07 NOTE — Telephone Encounter (Signed)
Messaged PA team 03/06/23 of PA needed.

## 2023-03-11 NOTE — Therapy (Signed)
OUTPATIENT PHYSICAL THERAPY THORACOLUMBAR TREATMENT   Patient Name: Lauren Soto MRN: [id number] DOB:05-18-1971, 52 y.o., female Today's Date: 03/12/2023  END OF SESSION:  PT End of Session - 03/12/23 1305     Visit Number 2    Date for PT Re-Evaluation 04/16/23    Authorization Type [employer]    PT Start Time 1304    PT Stop Time 1351    PT Time Calculation (min) 47 min    Activity Tolerance Patient tolerated treatment well    Behavior During Therapy WFL for tasks assessed/performed              Past Medical History:  Diagnosis Date   Diabetes mellitus without complication (HCC)    Hypertension    Vertigo    Past Surgical History:  Procedure Laterality Date   ABDOMINAL HYSTERECTOMY     BUNIONECTOMY     CHOLECYSTECTOMY     HERNIA REPAIR     umblical   SHOULDER ARTHROSCOPY     Patient Active Problem List   Diagnosis Date Noted   Severe sepsis (HCC) 02/03/2023   Postural dizziness with presyncope 02/03/2023   GERD (gastroesophageal reflux disease) 02/03/2023   Acute cystitis 02/03/2023   AKI (acute kidney injury) (HCC) 02/02/2023   Long term current use of oral hypoglycemic drug 11/06/2022   Chronic right hip pain 11/06/2022   Hyperlipidemia 11/06/2022   Vestibular migraine 07/06/2018   Non-insulin dependent type 2 diabetes mellitus (HCC) 06/18/2018   Asthma 04/21/2014   Menorrhagia 06/08/2012   Pelvic pain in female 06/08/2012   Essential hypertension 05/25/2012   Uterine fibroid 05/25/2012    PCP: Clayborne Dana, NP   REFERRING PROVIDER: Cammy Copa, MD   REFERRING DIAG: M54.16 (ICD-10-CM) - Lumbar radiculopathy   Rationale for Evaluation and Treatment: Rehabilitation  THERAPY DIAG:  Radiculopathy, lumbar region  Cramp and spasm  Muscle weakness (generalized)  Pain in right hip  ONSET DATE: chronic low back pain years, Leg pain started November 2023.   SUBJECTIVE:                                                                                                                                                                                            SUBJECTIVE STATEMENT: I've been cleaning stuff out at home so bending and lifting a lot. My pain is pretty bad. Pain is to R greater trochanter today.  Eval: Pain has been ongoing for about a year.  Pain is in back, right hip and leg radiating to down R calf, mild pain into groin as well. Had an epidural steroid injection with Dr. Alvester Morin on  01/21/2023, which helped 1-2 days.  Also was given a cortisone injection in her hip on 11/29/22.  It didn't help either.  Pain seems to be spreading and she's not sure if its coming from her back or hip.  Chronic back pain has been going on for years, has been checked for RA and that was ruled out.  The leg pain started in November.   PERTINENT HISTORY:  T2DM, HTN, vertigo & migraines, chronic LBP, history of abdominal hysterectomy and umbilical hernia repair.   PAIN:  Are you having pain? Yes: NPRS scale: 8/10 Pain location: midline low back, radiates up, and down Right leg into groin, aldo buttock down to knee, sometimes to calf.  Pain description: throbbing, achey, sharp in groin Aggravating factors: pain increases past 10/10, increased activity, moving a lot or prolonged sitting Relieving factors: sometimes a heating pad  PRECAUTIONS: None  RED FLAGS: None   WEIGHT BEARING RESTRICTIONS: No  FALLS:  Has patient fallen in last 6 months? No however R knee occasionally buckles/feels weak.   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 Subtle anterolisthesis at L4-L5 with at least moderate  facet arthropathy. No spinal stenosis but mild lateral recess and left foraminal stenosis at that level. 2. L5-S1 disc degeneration with a small rightward disc herniation most affecting the right lateral recess. Query right S1 radiculitis. 3. Partially visible lower thoracic disc degeneration with up to mild lower thoracic spinal stenosis. Visible lower spinal cord at those levels seems to remain normal.  11/24/22 MR Pelvis IMPRESSION: Moderate tendinosis with partial tearing of the proximal hamstring tendons bilaterally.   Mild tendinosis of the distal gluteus minimus tendons bilaterally with associated peritrochanteric edema, left greater than right.   Mild osteoarthritis of the hips.  PATIENT SURVEYS:  Modified Oswestry 27/50= 54% severe   COGNITION: Overall cognitive status: Within functional limits for tasks assessed     SENSATION: Not tested  MUSCLE LENGTH: Hamstrings: severe tightness bil  Quadriceps: severe tightness bil   POSTURE:  sits with weight shift to left  PALPATION: Tenderness with PA mob to L3-5, sacrum, bil SIJ tenderness throughout lumbar paraspinals, QL R gluteals, piriformis and greater trochanter,   LUMBAR ROM:   AROM eval  Flexion To knees, p!  Extension Limited 90!, p!  Right lateral flexion To mid thigh P!  Left lateral flexion To mid thigh p!  Right rotation Limited 50%  Left rotation Limited 50%   (Blank rows = not tested)  LOWER EXTREMITY ROM:   good hip IR/ER in R hip but painful, decreased hip IR on L compared to R   LOWER EXTREMITY MMT:    MMT Right eval Left eval  Hip flexion 3+ 4+  Hip extension 4- 4-  Hip abduction 4+ 5  Hip adduction 4+ 4+  Knee flexion 4 5  Knee extension 4+ 5  Ankle dorsiflexion 5 5  Ankle plantarflexion 5 5   (Blank rows = not tested)  LUMBAR SPECIAL TESTS:  Straight leg raise test: positive on R and cross straight leg, Slump test: Positive, and FABER test: Positive  FUNCTIONAL TESTS:    GAIT: Distance walked: 42' Assistive device utilized: None Level of assistance: Complete Independence Comments: visually  slow, no significant deviation but does report intermittent R knee buckling.   TODAY'S TREATMENT:                                                                                                                              DATE:   03/12/23 Nustep L5 x 6 min Standing lumbar ext forearms on wall x 5 (pt shifts left) R lateral shift x 10 Standing lumbar ext forearms x 6 with some PT assist to avoid shift Prone on elbows no change Prone press ups x 8 increases intensity of hip and back Prone lying still feels in back and hip Prone over one pillow  decreases to 7/10 Prone over two pillows  no change Pelvic press series: press 5"x5, press with Prone knee bends x 5 ea, B knee bend x 5 Supine  pelvic tilt x 5  PPT + bridge x 5     03/05/2023 Self Care:      Findings, POC, Pain Neuroscience Education.  Patient educated on the concept of the nervous system as the bodies alarm system, and the role of nociception to warn the body of danger.  Peripheral nerve sensitization, hyperalgesia and allodynia were explained using metaphors to promote deep learning.  Patient educated on the concept of neuroplasticity and how factors such as temperature, stress, movement, immunity and blood flow affect pain via channel expression.  Role of exercise helping decrease sensitivity.   Initial HEP given: prone lying and prone knee bends.  Also discussed soda pop metaphor for core for need for core strengthening due to history of abdominal surgery.     PATIENT EDUCATION:  Education details: see self care, initial HEP.  Person educated: Patient Education method: Explanation Education comprehension: verbalized understanding and returned demonstration  HOME EXERCISE PROGRAM: Access Code: [email or web address]  URL: [email or web address] Date: 03/12/2023 Prepared by: Raynelle Fanning  Exercises -  Supine Posterior Pelvic Tilt  - 1-2 x daily - 7 x weekly - 1 sets - 10 reps - Supine Bridge  - 2 x daily - 7 x weekly - 1-3 sets - 10 reps - Right Standing Lateral Shift Correction at Wall - Hold  - 2 x daily - 7 x weekly - 1 sets - 10 reps - 5-10 sec hold - Lying Prone with 1 Pillow  - 1 x daily - 3-4 x weekly - 1 sets - 10 reps   Pelvic Press     Place hands under belly between navel and pubic bone, palms up. Feel pressure on hands. Increase pressure on hands by pressing pelvis down. This is NOT a pelvic tilt. Hold __5_ seconds. Relax. Repeat _10__ times. Once a day.  KNEE: Flexion - Prone   Hold pelvic press. Bend knee, then return the foot down. Repeat on opposite leg. Do not raise hips. _5-10__ reps per set. When this is mastered, pull both heels up at same time, x 10 reps.  Once a day   Leg Lift: One-Leg  Press pelvis down. Keep knee straight; lengthen and lift one leg (from waist). Do not twist body. Keep other leg down. Hold _2__ seconds. Relax. Repeat 10 time. Repeat with other leg.    ASSESSMENT:  CLINICAL IMPRESSION: Rexine presents with ongoing high level of back and R hip pain. She demonstrates a lateral shift with lumbar ext at wall so we added correction to HEP. She had higher pain with extension exercises so went to prone over one pillow for initial TE. She tolerated well and reports "it's not pain but just sore" at end of session. She would like to test gait with various canes at a future visit to determine what type of AD to take on her cruise. Amyree continues to demonstrate potential for improvement and would benefit from continued skilled therapy to address impairments.    Eval: Taleigh Sepe Steed-Matthews  is a 52 y.o. female who was seen today for physical therapy evaluation and treatment for lumbar radiculopathy.    Patient presents with low back pain with right sided radicular symptoms which are interfering with ADLs and are impacting quality of life.  She also  demonstrates weakness in R hip, and reports intermittent buckling in R knee.  Noted today pain with R hip IR/ER (with positive FABER and Scour tests) and tenderness over greater trochanter, so unable to rule out R hip contributing to back pain as well.  On Modified Oswestry patient scored 54% demonstrating severe disability.   Patient will benefit from skilled physical therapy services to help reach the maximal level of functional independence and mobility. Patient demonstrates understanding of this plan of care and is in agreement with this plan.   She was given initial HEP today for prone on elbows and prone knee bends as tolerated this position well and reported decreased discomfort.    OBJECTIVE IMPAIRMENTS: decreased activity tolerance, decreased endurance, decreased mobility, difficulty walking, decreased ROM, decreased strength, increased fascial restrictions, impaired perceived functional ability, increased muscle spasms, impaired flexibility, postural dysfunction, and pain.   ACTIVITY LIMITATIONS: carrying, lifting, bending, sitting, standing, squatting, sleeping, transfers, bed mobility, and locomotion level  PARTICIPATION LIMITATIONS: meal prep, cleaning, laundry, shopping, community activity, and occupation  PERSONAL FACTORS: Time since onset of injury/illness/exacerbation and 3+ comorbidities: T2DM, HTN, vertigo & migraines, chronic LBP, history of abdominal hysterectomy and umbilical hernia repair.  are also affecting patient's functional outcome.   REHAB POTENTIAL: Good  CLINICAL DECISION MAKING: Evolving/moderate complexity  EVALUATION COMPLEXITY: Moderate   GOALS: Goals reviewed with patient? Yes  SHORT TERM GOALS: Target date: 03/19/2023   Patient will be independent with initial HEP.  Baseline:  Goal status: INITIAL   LONG TERM GOALS: Target date: 04/16/2023   Patient will be independent with advanced/ongoing HEP to improve outcomes and carryover.  Baseline:  Goal  status: INITIAL  2.  Patient will report 75% improvement in low back pain to improve QOL.  Baseline:  Goal status: INITIAL  3.  Patient will demonstrate full pain free lumbar ROM to perform ADLs.   Baseline: see objective Goal status: INITIAL  4.  Patient will demonstrate improved functional strength as demonstrated by 4+/5 R hip strength. Baseline: see objective Goal status: INITIAL  5.  Patient will report at least 6 points improvement on modified Oswestry to demonstrate improved functional ability.  Baseline: 27/50 Goal status: INITIAL   6.  Patient will report being able to sleep without interruption from LBP to improve QOL. Baseline: reports sleeping only 1/4 normal amount due to pain Goal status:  INITIAL  7.   Patient will report centralization of radicular symptoms.  Baseline: radiates down RLE to calf and groin Goal status: INITIAL  PLAN:  PT FREQUENCY: 1-2x/week  PT DURATION: 6 weeks  PLANNED INTERVENTIONS: Therapeutic exercises, Therapeutic activity, Neuromuscular re-education, Balance training, Gait training, Patient/Family education, Self Care, Joint mobilization, Joint manipulation, Orthotic/Fit training, Dry Needling, Electrical stimulation, Spinal manipulation, Spinal mobilization, Cryotherapy, Moist heat, Taping, Traction, Ultrasound, Manual therapy, and Re-evaluation.  PLAN FOR NEXT SESSION: review and progress exercises - McKenzie extension if tolerated, Neutral spine for core strengthening, manual therapy - TrDN if willing to lumbar multifidi and R glutes, modalities PRN.    Solon Palm, PT  03/12/2023, 3:27 PM

## 2023-03-12 ENCOUNTER — Ambulatory Visit: Payer: Medicaid Other | Admitting: Physical Therapy

## 2023-03-12 ENCOUNTER — Encounter: Payer: Self-pay | Admitting: Physical Therapy

## 2023-03-12 DIAGNOSIS — M6281 Muscle weakness (generalized): Secondary | ICD-10-CM

## 2023-03-12 DIAGNOSIS — M5416 Radiculopathy, lumbar region: Secondary | ICD-10-CM | POA: Diagnosis not present

## 2023-03-12 DIAGNOSIS — M25551 Pain in right hip: Secondary | ICD-10-CM

## 2023-03-12 DIAGNOSIS — R252 Cramp and spasm: Secondary | ICD-10-CM

## 2023-03-12 NOTE — Patient Instructions (Signed)
Pelvic Press     Place hands under belly between navel and pubic bone, palms up. Feel pressure on hands. Increase pressure on hands by pressing pelvis down. This is NOT a pelvic tilt. Hold __5_ seconds. Relax. Repeat _10__ times. Once a day.  KNEE: Flexion - Prone   Hold pelvic press. Bend knee, then return the foot down. Repeat on opposite leg. Do not raise hips. _5-10__ reps per set. When this is mastered, pull both heels up at same time, x 10 reps.  Once a day   Leg Lift: One-Leg   Press pelvis down. Keep knee straight; lengthen and lift one leg (from waist). Do not twist body. Keep other leg down. Hold _2__ seconds. Relax. Repeat 5-10 time. Repeat with other leg.

## 2023-03-17 ENCOUNTER — Ambulatory Visit: Payer: Medicaid Other | Admitting: Physical Therapy

## 2023-03-17 ENCOUNTER — Encounter: Payer: Self-pay | Admitting: Physical Therapy

## 2023-03-17 DIAGNOSIS — M6281 Muscle weakness (generalized): Secondary | ICD-10-CM

## 2023-03-17 DIAGNOSIS — M25551 Pain in right hip: Secondary | ICD-10-CM

## 2023-03-17 DIAGNOSIS — M5416 Radiculopathy, lumbar region: Secondary | ICD-10-CM

## 2023-03-17 DIAGNOSIS — R252 Cramp and spasm: Secondary | ICD-10-CM

## 2023-03-17 NOTE — Therapy (Signed)
OUTPATIENT PHYSICAL THERAPY THORACOLUMBAR TREATMENT   Patient Name: Lauren Soto MRN: [id number] DOB:March 16, 1971, 52 y.o., female Today's Date: 03/17/2023  END OF SESSION:  PT End of Session - 03/17/23 1152     Visit Number 3    Date for PT Re-Evaluation 04/16/23    Authorization Type [employer]    PT Start Time 1150    PT Stop Time 1248    PT Time Calculation (min) 58 min              Past Medical History:  Diagnosis Date   Diabetes mellitus without complication (HCC)    Hypertension    Vertigo    Past Surgical History:  Procedure Laterality Date   ABDOMINAL HYSTERECTOMY     BUNIONECTOMY     CHOLECYSTECTOMY     HERNIA REPAIR     umblical   SHOULDER ARTHROSCOPY     Patient Active Problem List   Diagnosis Date Noted   Severe sepsis (HCC) 02/03/2023   Postural dizziness with presyncope 02/03/2023   GERD (gastroesophageal reflux disease) 02/03/2023   Acute cystitis 02/03/2023   AKI (acute kidney injury) (HCC) 02/02/2023   Long term current use of oral hypoglycemic drug 11/06/2022   Chronic right hip pain 11/06/2022   Hyperlipidemia 11/06/2022   Vestibular migraine 07/06/2018   Non-insulin dependent type 2 diabetes mellitus (HCC) 06/18/2018   Asthma 04/21/2014   Menorrhagia 06/08/2012   Pelvic pain in female 06/08/2012   Essential hypertension 05/25/2012   Uterine fibroid 05/25/2012    PCP: Clayborne Dana, NP   REFERRING PROVIDER: Cammy Copa, MD   REFERRING DIAG: M54.16 (ICD-10-CM) - Lumbar radiculopathy   Rationale for Evaluation and Treatment: Rehabilitation  THERAPY DIAG:  Radiculopathy, lumbar region  Cramp and spasm  Muscle weakness (generalized)  Pain in right hip  ONSET DATE: chronic low back pain years, Leg pain started November 2023.   SUBJECTIVE:                                                                                                                                                                                            SUBJECTIVE STATEMENT: More pain today but has been doing a lot around the house.  Has been doing her exercises, they feel better now.   Eval: Pain has been ongoing for about a year.  Pain is in back, right hip and leg radiating to down R calf, mild pain into groin as well. Had an epidural steroid injection with Dr. Alvester Morin on 01/21/2023, which helped 1-2 days.  Also was given a cortisone injection in her hip on 11/29/22.  It didn't help  either.  Pain seems to be spreading and she's not sure if its coming from her back or hip.  Chronic back pain has been going on for years, has been checked for RA and that was ruled out.  The leg pain started in November.   PERTINENT HISTORY:  T2DM, HTN, vertigo & migraines, chronic LBP, history of abdominal hysterectomy and umbilical hernia repair.   PAIN:  Are you having pain? Yes: NPRS scale: 9/10 Pain location: midline low back, radiates up, and down Right leg into groin, aldo buttock down to knee, sometimes to calf.  Pain description: throbbing, achey, sharp in groin Aggravating factors: pain increases past 10/10, increased activity, moving a lot or prolonged sitting Relieving factors: sometimes a heating pad  PRECAUTIONS: None  RED FLAGS: None   WEIGHT BEARING RESTRICTIONS: No  FALLS:  Has patient fallen in last 6 months? No however R knee occasionally buckles/feels weak.   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 Subtle anterolisthesis at L4-L5 with at least moderate facet arthropathy. No spinal stenosis but mild lateral recess and left foraminal stenosis at that level. 2. L5-S1 disc degeneration with a small  rightward disc herniation most affecting the right lateral recess. Query right S1 radiculitis. 3. Partially visible lower thoracic disc degeneration with up to mild lower thoracic spinal stenosis. Visible lower spinal cord at those levels seems to remain normal.  11/24/22 MR Pelvis IMPRESSION: Moderate tendinosis with partial tearing of the proximal hamstring tendons bilaterally.   Mild tendinosis of the distal gluteus minimus tendons bilaterally with associated peritrochanteric edema, left greater than right.   Mild osteoarthritis of the hips.  PATIENT SURVEYS:  Modified Oswestry 27/50= 54% severe   COGNITION: Overall cognitive status: Within functional limits for tasks assessed     SENSATION: Not tested  MUSCLE LENGTH: Hamstrings: severe tightness bil  Quadriceps: severe tightness bil   POSTURE:  sits with weight shift to left  PALPATION: Tenderness with PA mob to L3-5, sacrum, bil SIJ tenderness throughout lumbar paraspinals, QL R gluteals, piriformis and greater trochanter,   LUMBAR ROM:   AROM eval  Flexion To knees, p!  Extension Limited 90!, p!  Right lateral flexion To mid thigh P!  Left lateral flexion To mid thigh p!  Right rotation Limited 50%  Left rotation Limited 50%   (Blank rows = not tested)  LOWER EXTREMITY ROM:   good hip IR/ER in R hip but painful, decreased hip IR on L compared to R   LOWER EXTREMITY MMT:    MMT Right eval Left eval  Hip flexion 3+ 4+  Hip extension 4- 4-  Hip abduction 4+ 5  Hip adduction 4+ 4+  Knee flexion 4 5  Knee extension 4+ 5  Ankle dorsiflexion 5 5  Ankle plantarflexion 5 5   (Blank rows = not tested)  LUMBAR SPECIAL TESTS:  Straight leg raise test: positive on R and cross straight leg, Slump test: Positive, and FABER test: Positive  FUNCTIONAL TESTS:   GAIT: Distance walked: 36' Assistive device utilized: None Level of assistance: Complete Independence Comments: visually slow, no significant  deviation but does report intermittent R knee buckling.   TODAY'S TREATMENT:  DATE:   03/17/23  Self Care:; Trialing different canes for upcoming trip- single point, quad cane, and walking poles, pros and cons for each, cues for posture and cane placement.  Also education on TENS. Manual Therapy: to decrease muscle spasm and pain and improve mobility STM/TPR to low back, UPA mobs to lumbar spine, IASTM with foam roller to bil glutes, QL, erector spinea and proximal hamstrings.  Modalities: Estim (IFC) to low back with MHP x 10 min in prone   03/12/23 Nustep L5 x 6 min Standing lumbar ext forearms on wall x 5 (pt shifts left) R lateral shift x 10 Standing lumbar ext forearms x 6 with some PT assist to avoid shift Prone on elbows no change Prone press ups x 8 increases intensity of hip and back Prone lying still feels in back and hip Prone over one pillow  decreases to 7/10 Prone over two pillows  no change Pelvic press series: press 5"x5, press with Prone knee bends x 5 ea, B knee bend x 5 Supine  pelvic tilt x 5  PPT + bridge x 5     03/05/2023 Self Care:      Findings, POC, Pain Neuroscience Education.  Patient educated on the concept of the nervous system as the bodies alarm system, and the role of nociception to warn the body of danger.  Peripheral nerve sensitization, hyperalgesia and allodynia were explained using metaphors to promote deep learning.  Patient educated on the concept of neuroplasticity and how factors such as temperature, stress, movement, immunity and blood flow affect pain via channel expression.  Role of exercise helping decrease sensitivity.   Initial HEP given: prone lying and prone knee bends.  Also discussed soda pop metaphor for core for need for core strengthening due to history of abdominal surgery.     PATIENT EDUCATION:  Education  details: continue HEP Person educated: Patient Education method: Explanation Education comprehension: verbalized understanding and returned demonstration  HOME EXERCISE PROGRAM: Access Code: [email or web address]  URL: [email or web address] Date: 03/12/2023 Prepared by: Raynelle Fanning  Exercises - Supine Posterior Pelvic Tilt  - 1-2 x daily - 7 x weekly - 1 sets - 10 reps - Supine Bridge  - 2 x daily - 7 x weekly - 1-3 sets - 10 reps - Right Standing Lateral Shift Correction at Wall - Hold  - 2 x daily - 7 x weekly - 1 sets - 10 reps - 5-10 sec hold - Lying Prone with 1 Pillow  - 1 x daily - 3-4 x weekly - 1 sets - 10 reps   Pelvic Press     Place hands under belly between navel and pubic bone, palms up. Feel pressure on hands. Increase pressure on hands by pressing pelvis down. This is NOT a pelvic tilt. Hold __5_ seconds. Relax. Repeat _10__ times. Once a day.  KNEE: Flexion - Prone   Hold pelvic press. Bend knee, then return the foot down. Repeat on opposite leg. Do not raise hips. _5-10__ reps per set. When this is mastered, pull both heels up at same time, x 10 reps.  Once a day   Leg Lift: One-Leg   Press pelvis down. Keep knee straight; lengthen and lift one leg (from waist). Do not twist body. Keep other leg down. Hold _2__ seconds. Relax. Repeat 10 time. Repeat with other leg.    ASSESSMENT:  CLINICAL IMPRESSION: Iveth reports good compliance with initial HEP, meeting STG #1.  Today trialed different canes and also walking pole for upcoming cruise,  noted best posture and fasted strike with walking poles.  Having increased pain, so focused remaining session on manual therapy followed by Estim and MHP, reported decreased pain following.  Given information on inexpensive TENS unit as well.  Alanii Peruski Steed-Matthews continues to demonstrate potential for improvement and would benefit from continued skilled therapy to address impairments.     Eval: Lashika Carbin Steed-Matthews  is a  52 y.o. female who was seen today for physical therapy evaluation and treatment for lumbar radiculopathy.    Patient presents with low back pain with right sided radicular symptoms which are interfering with ADLs and are impacting quality of life.  She also demonstrates weakness in R hip, and reports intermittent buckling in R knee.  Noted today pain with R hip IR/ER (with positive FABER and Scour tests) and tenderness over greater trochanter, so unable to rule out R hip contributing to back pain as well.  On Modified Oswestry patient scored 54% demonstrating severe disability.   Patient will benefit from skilled physical therapy services to help reach the maximal level of functional independence and mobility. Patient demonstrates understanding of this plan of care and is in agreement with this plan.   She was given initial HEP today for prone on elbows and prone knee bends as tolerated this position well and reported decreased discomfort.    OBJECTIVE IMPAIRMENTS: decreased activity tolerance, decreased endurance, decreased mobility, difficulty walking, decreased ROM, decreased strength, increased fascial restrictions, impaired perceived functional ability, increased muscle spasms, impaired flexibility, postural dysfunction, and pain.   ACTIVITY LIMITATIONS: carrying, lifting, bending, sitting, standing, squatting, sleeping, transfers, bed mobility, and locomotion level  PARTICIPATION LIMITATIONS: meal prep, cleaning, laundry, shopping, community activity, and occupation  PERSONAL FACTORS: Time since onset of injury/illness/exacerbation and 3+ comorbidities: T2DM, HTN, vertigo & migraines, chronic LBP, history of abdominal hysterectomy and umbilical hernia repair.  are also affecting patient's functional outcome.   REHAB POTENTIAL: Good  CLINICAL DECISION MAKING: Evolving/moderate complexity  EVALUATION COMPLEXITY: Moderate   GOALS: Goals reviewed with patient? Yes  SHORT TERM GOALS: Target  date: 03/19/2023   Patient will be independent with initial HEP.  Baseline:  Goal status: MET 03/17/23   LONG TERM GOALS: Target date: 04/16/2023   Patient will be independent with advanced/ongoing HEP to improve outcomes and carryover.  Baseline:  Goal status: IN PROGRESS  2.  Patient will report 75% improvement in low back pain to improve QOL.  Baseline:  Goal status: IN PROGRESS  3.  Patient will demonstrate full pain free lumbar ROM to perform ADLs.   Baseline: see objective Goal status: IN PROGRESS  4.  Patient will demonstrate improved functional strength as demonstrated by 4+/5 R hip strength. Baseline: see objective Goal status: IN PROGRESS  5.  Patient will report at least 6 points improvement on modified Oswestry to demonstrate improved functional ability.  Baseline: 27/50 Goal status: IN PROGRESS   6.  Patient will report being able to sleep without interruption from LBP to improve QOL. Baseline: reports sleeping only 1/4 normal amount due to pain Goal status: IN PROGRESS  7.   Patient will report centralization of radicular symptoms.  Baseline: radiates down RLE to calf and groin Goal status: IN PROGRESS  PLAN:  PT FREQUENCY: 1-2x/week  PT DURATION: 6 weeks  PLANNED INTERVENTIONS: Therapeutic exercises, Therapeutic activity, Neuromuscular re-education, Balance training, Gait training, Patient/Family education, Self Care, Joint mobilization, Joint manipulation, Orthotic/Fit training, Dry Needling, Electrical stimulation, Spinal manipulation, Spinal mobilization, Cryotherapy, Moist heat, Taping, Traction, Ultrasound, Manual  therapy, and Re-evaluation.  PLAN FOR NEXT SESSION: review and progress exercises - McKenzie extension if tolerated, Neutral spine for core strengthening, manual therapy - TrDN if willing to lumbar multifidi and R glutes, modalities PRN.    Jena Gauss, PT  03/17/2023, 1:03 PM

## 2023-03-18 ENCOUNTER — Other Ambulatory Visit: Payer: Self-pay

## 2023-03-18 DIAGNOSIS — R42 Dizziness and giddiness: Secondary | ICD-10-CM | POA: Insufficient documentation

## 2023-03-18 DIAGNOSIS — I10 Essential (primary) hypertension: Secondary | ICD-10-CM | POA: Insufficient documentation

## 2023-03-18 DIAGNOSIS — E119 Type 2 diabetes mellitus without complications: Secondary | ICD-10-CM | POA: Insufficient documentation

## 2023-03-18 NOTE — Therapy (Signed)
OUTPATIENT PHYSICAL THERAPY THORACOLUMBAR TREATMENT   Patient Name: Lauren Soto MRN: [id number] DOB:04-26-1971, 52 y.o., female Today's Date: 03/19/2023  END OF SESSION:  PT End of Session - 03/19/23 1358     Visit Number 4    Date for PT Re-Evaluation 04/16/23    PT Start Time 1358    PT Stop Time 1427    PT Time Calculation (min) 29 min    Activity Tolerance Patient tolerated treatment well    Behavior During Therapy [facility] for tasks assessed/performed               Past Medical History:  Diagnosis Date   Acute cystitis 02/03/2023   AKI (acute kidney injury) (HCC) 02/02/2023   Asthma 04/21/2014   Chronic right hip pain 11/06/2022   Diabetes mellitus without complication (HCC)    Essential hypertension 05/25/2012   GERD (gastroesophageal reflux disease) 02/03/2023   Hyperlipidemia 11/06/2022   Hypertension    Long term current use of oral hypoglycemic drug 11/06/2022   Menorrhagia 06/08/2012   Non-insulin dependent type 2 diabetes mellitus (HCC) 06/18/2018   Pelvic pain in female 06/08/2012   Postural dizziness with presyncope 02/03/2023   Severe sepsis (HCC) 02/03/2023   Uterine fibroid 05/25/2012   Vertigo    Vestibular migraine 07/06/2018   Past Surgical History:  Procedure Laterality Date   ABDOMINAL HYSTERECTOMY     BUNIONECTOMY     CHOLECYSTECTOMY     HERNIA REPAIR     umblical   SHOULDER ARTHROSCOPY     Patient Active Problem List   Diagnosis Date Noted   Diabetes mellitus without complication (HCC)    Hypertension    Vertigo    Severe sepsis (HCC) 02/03/2023   Postural dizziness with presyncope 02/03/2023   GERD (gastroesophageal reflux disease) 02/03/2023   Acute cystitis 02/03/2023   AKI (acute kidney injury) (HCC) 02/02/2023   Long term current use of oral hypoglycemic drug 11/06/2022   Chronic right hip pain 11/06/2022   Hyperlipidemia 11/06/2022   Vestibular migraine 07/06/2018   Non-insulin dependent type 2 diabetes mellitus  (HCC) 06/18/2018   Asthma 04/21/2014   Menorrhagia 06/08/2012   Pelvic pain in female 06/08/2012   Essential hypertension 05/25/2012   Uterine fibroid 05/25/2012    PCP: Clayborne Dana, NP   REFERRING PROVIDER: Cammy Copa, MD   REFERRING DIAG: M54.16 (ICD-10-CM) - Lumbar radiculopathy   Rationale for Evaluation and Treatment: Rehabilitation  THERAPY DIAG:  Radiculopathy, lumbar region  Cramp and spasm  Muscle weakness (generalized)  Pain in right hip  ONSET DATE: chronic low back pain years, Leg pain started November 2023.   SUBJECTIVE:  SUBJECTIVE STATEMENT: Patient 13 min late today. She reports increased pain in her ribs right up under her breast since Monday afternoon.   Eval: Pain has been ongoing for about a year.  Pain is in back, right hip and leg radiating to down R calf, mild pain into groin as well. Had an epidural steroid injection with Dr. Alvester Morin on 01/21/2023, which helped 1-2 days.  Also was given a cortisone injection in her hip on 11/29/22.  It didn't help either.  Pain seems to be spreading and she's not sure if its coming from her back or hip.  Chronic back pain has been going on for years, has been checked for RA and that was ruled out.  The leg pain started in November.   PERTINENT HISTORY:  T2DM, HTN, vertigo & migraines, chronic LBP, history of abdominal hysterectomy and umbilical hernia repair.   PAIN:  Are you having pain? Yes: NPRS scale: 10/10 Pain location: midline low back, radiates up, and down Right leg into groin, aldo buttock down to knee, sometimes to calf.  Pain description: throbbing, achey, sharp in groin Aggravating factors: pain increases past 10/10, increased activity, moving a lot or prolonged sitting Relieving factors: sometimes a heating  pad  PRECAUTIONS: None  RED FLAGS: None   WEIGHT BEARING RESTRICTIONS: No  FALLS:  Has patient fallen in last 6 months? No however R knee occasionally buckles/feels weak.   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 Subtle anterolisthesis at L4-L5 with at least moderate facet arthropathy. No spinal stenosis but mild lateral recess and left foraminal stenosis at that level. 2. L5-S1 disc degeneration with a small rightward disc herniation most affecting the right lateral recess. Query right S1 radiculitis. 3. Partially visible lower thoracic disc degeneration with up to mild lower thoracic spinal stenosis. Visible lower spinal cord at those levels seems to remain normal.  11/24/22 MR Pelvis IMPRESSION: Moderate tendinosis with partial tearing of the proximal hamstring tendons bilaterally.   Mild tendinosis of the distal gluteus minimus tendons bilaterally with associated peritrochanteric edema, left greater than right.   Mild osteoarthritis of the hips.  PATIENT SURVEYS:  Modified Oswestry 27/50= 54% severe   COGNITION: Overall cognitive status: Within functional limits for tasks assessed     SENSATION: Not tested  MUSCLE LENGTH: Hamstrings: severe tightness bil  Quadriceps: severe tightness bil   POSTURE:  sits with weight shift to left  PALPATION: Tenderness with PA mob to L3-5, sacrum, bil SIJ tenderness throughout lumbar paraspinals, QL R gluteals, piriformis and greater trochanter,   LUMBAR ROM:   AROM eval  Flexion To knees, p!  Extension Limited 90!, p!  Right lateral flexion To mid thigh P!  Left lateral flexion To mid thigh p!  Right  rotation Limited 50%  Left rotation Limited 50%   (Blank rows = not tested)  LOWER EXTREMITY ROM:   good hip IR/ER in R hip but painful, decreased hip IR on L compared to R   LOWER EXTREMITY MMT:    MMT Right eval Left eval  Hip flexion 3+ 4+  Hip extension 4- 4-  Hip abduction 4+ 5  Hip adduction 4+ 4+  Knee flexion 4 5  Knee extension  4+ 5  Ankle dorsiflexion 5 5  Ankle plantarflexion 5 5   (Blank rows = not tested)  LUMBAR SPECIAL TESTS:  Straight leg raise test: positive on R and cross straight leg, Slump test: Positive, and FABER test: Positive  FUNCTIONAL TESTS:   GAIT: Distance walked: 14' Assistive device utilized: None Level of assistance: Complete Independence Comments: visually slow, no significant deviation but does report intermittent R knee buckling.   TODAY'S TREATMENT:                                                                                                                              DATE:   03/19/23 Nustep L5 x 6 status assessed. Hooklying diaphragmatic breathing with 2:1 inhale to exhale ratio - working to release tight right diaphragm and decrease rib pain.   Manual Therapy: to decrease muscle spasm and pain and improve mobility. TPR to R QL and R diaphragm. Skilled palpation and monitoring of soft tissues during DN. STM to R gluteals post DN. Trigger Point Dry-Needling  Treatment instructions: Expect mild to moderate muscle soreness. S/S of pneumothorax if dry needled over a lung field, and to seek immediate medical attention should they occur. Patient verbalized understanding of these instructions and education. Patient Consent Given: Yes Education handout provided: Yes Muscles treated: R glue min/med and piriformis, R QL Electrical stimulation performed: No Parameters: N/A Treatment response/outcome: Twitch Response Elicited and Palpable Increase in Muscle Length     03/17/23  Self Care:; Trialing different canes for upcoming trip-  single point, quad cane, and walking poles, pros and cons for each, cues for posture and cane placement.  Also education on TENS. Manual Therapy: to decrease muscle spasm and pain and improve mobility STM/TPR to low back, UPA mobs to lumbar spine, IASTM with foam roller to bil glutes, QL, erector spinea and proximal hamstrings.  Modalities: Estim (IFC) to low back with MHP x 10 min in prone   03/12/23 Nustep L5 x 6 min Standing lumbar ext forearms on wall x 5 (pt shifts left) R lateral shift x 10 Standing lumbar ext forearms x 6 with some PT assist to avoid shift Prone on elbows no change Prone press ups x 8 increases intensity of hip and back Prone lying still feels in back and hip Prone over one pillow  decreases to 7/10 Prone over two pillows  no change Pelvic press series: press 5"x5, press with Prone knee bends x 5 ea, B knee bend x 5 Supine  pelvic tilt x 5  PPT + bridge x 5     03/05/2023 Self Care:      Findings, POC, Pain Neuroscience Education.  Patient educated on the concept of the nervous system as the bodies alarm system, and the role of nociception to warn the body of danger.  Peripheral nerve sensitization, hyperalgesia and allodynia were explained using metaphors to promote deep learning.  Patient educated on the concept of neuroplasticity and how factors  such as temperature, stress, movement, immunity and blood flow affect pain via channel expression.  Role of exercise helping decrease sensitivity.   Initial HEP given: prone lying and prone knee bends.  Also discussed soda pop metaphor for core for need for core strengthening due to history of abdominal surgery.     PATIENT EDUCATION:  Education details: continue HEP Person educated: Patient Education method: Explanation Education comprehension: verbalized understanding and returned demonstration  HOME EXERCISE PROGRAM: Access Code: [email or web address]  URL: [email or web address] Date: 03/12/2023 Prepared by:  Raynelle Fanning  Exercises - Supine Posterior Pelvic Tilt  - 1-2 x daily - 7 x weekly - 1 sets - 10 reps - Supine Bridge  - 2 x daily - 7 x weekly - 1-3 sets - 10 reps - Right Standing Lateral Shift Correction at Wall - Hold  - 2 x daily - 7 x weekly - 1 sets - 10 reps - 5-10 sec hold - Lying Prone with 1 Pillow  - 1 x daily - 3-4 x weekly - 1 sets - 10 reps   Pelvic Press     Place hands under belly between navel and pubic bone, palms up. Feel pressure on hands. Increase pressure on hands by pressing pelvis down. This is NOT a pelvic tilt. Hold __5_ seconds. Relax. Repeat _10__ times. Once a day.  KNEE: Flexion - Prone   Hold pelvic press. Bend knee, then return the foot down. Repeat on opposite leg. Do not raise hips. _5-10__ reps per set. When this is mastered, pull both heels up at same time, x 10 reps.  Once a day   Leg Lift: One-Leg   Press pelvis down. Keep knee straight; lengthen and lift one leg (from waist). Do not twist body. Keep other leg down. Hold _2__ seconds. Relax. Repeat 10 time. Repeat with other leg.    ASSESSMENT:  CLINICAL IMPRESSION: Lauren Soto reports increased pain today to 9/10 and reports new R side rib pain under right breast and around side of insidious onset. Due to late arrival, we focused on manual therapy to decrease pain and muscle tension. Initial trial of DN to R hip and lumbar went well with good twitch responses primarily in the hip. Patient demonstrated decreased tissue tension as well. Upon assessment she demonstrated a tight R diaphragm along the R ribs and had some discomfort with deep breaths. Educated pt on diaphragmatic breathing with excellent response to tight tissues. She may benefit from further assessment of thoracic spine at next visit if pain continues.    Eval: Lauren Soto  is a 52 y.o. female who was seen today for physical therapy evaluation and treatment for lumbar radiculopathy.    Patient presents with low back pain with right  sided radicular symptoms which are interfering with ADLs and are impacting quality of life.  She also demonstrates weakness in R hip, and reports intermittent buckling in R knee.  Noted today pain with R hip IR/ER (with positive FABER and Scour tests) and tenderness over greater trochanter, so unable to rule out R hip contributing to back pain as well.  On Modified Oswestry patient scored 54% demonstrating severe disability.   Patient will benefit from skilled physical therapy services to help reach the maximal level of functional independence and mobility. Patient demonstrates understanding of this plan of care and is in agreement with this plan.   She was given initial HEP today for prone on elbows and prone knee bends as tolerated this position well and reported decreased discomfort.  OBJECTIVE IMPAIRMENTS: decreased activity tolerance, decreased endurance, decreased mobility, difficulty walking, decreased ROM, decreased strength, increased fascial restrictions, impaired perceived functional ability, increased muscle spasms, impaired flexibility, postural dysfunction, and pain.   ACTIVITY LIMITATIONS: carrying, lifting, bending, sitting, standing, squatting, sleeping, transfers, bed mobility, and locomotion level  PARTICIPATION LIMITATIONS: meal prep, cleaning, laundry, shopping, community activity, and occupation  PERSONAL FACTORS: Time since onset of injury/illness/exacerbation and 3+ comorbidities: T2DM, HTN, vertigo & migraines, chronic LBP, history of abdominal hysterectomy and umbilical hernia repair.  are also affecting patient's functional outcome.   REHAB POTENTIAL: Good  CLINICAL DECISION MAKING: Evolving/moderate complexity  EVALUATION COMPLEXITY: Moderate   GOALS: Goals reviewed with patient? Yes  SHORT TERM GOALS: Target date: 03/19/2023   Patient will be independent with initial HEP.  Baseline:  Goal status: MET 03/17/23   LONG TERM GOALS: Target date: 04/16/2023    Patient will be independent with advanced/ongoing HEP to improve outcomes and carryover.  Baseline:  Goal status: IN PROGRESS  2.  Patient will report 75% improvement in low back pain to improve QOL.  Baseline:  Goal status: IN PROGRESS  3.  Patient will demonstrate full pain free lumbar ROM to perform ADLs.   Baseline: see objective Goal status: IN PROGRESS  4.  Patient will demonstrate improved functional strength as demonstrated by 4+/5 R hip strength. Baseline: see objective Goal status: IN PROGRESS  5.  Patient will report at least 6 points improvement on modified Oswestry to demonstrate improved functional ability.  Baseline: 27/50 Goal status: IN PROGRESS   6.  Patient will report being able to sleep without interruption from LBP to improve QOL. Baseline: reports sleeping only 1/4 normal amount due to pain Goal status: IN PROGRESS  7.   Patient will report centralization of radicular symptoms.  Baseline: radiates down RLE to calf and groin Goal status: IN PROGRESS  PLAN:  PT FREQUENCY: 1-2x/week  PT DURATION: 6 weeks  PLANNED INTERVENTIONS: Therapeutic exercises, Therapeutic activity, Neuromuscular re-education, Balance training, Gait training, Patient/Family education, Self Care, Joint mobilization, Joint manipulation, Orthotic/Fit training, Dry Needling, Electrical stimulation, Spinal manipulation, Spinal mobilization, Cryotherapy, Moist heat, Taping, Traction, Ultrasound, Manual therapy, and Re-evaluation.  PLAN FOR NEXT SESSION: Assess response to DN and diaphragmatic breathing. Assess T spine if indicated. review and progress exercises - McKenzie extension if tolerated, Neutral spine for core strengthening, manual therapy - TrDN if willing to lumbar multifidi and R glutes, modalities PRN.    Solon Palm, PT  03/19/2023, 2:35 PM

## 2023-03-19 ENCOUNTER — Telehealth: Payer: Self-pay | Admitting: Pharmacy Technician

## 2023-03-19 ENCOUNTER — Ambulatory Visit: Payer: Medicaid Other | Attending: Cardiology | Admitting: Cardiology

## 2023-03-19 ENCOUNTER — Encounter: Payer: Self-pay | Admitting: Pharmacist

## 2023-03-19 ENCOUNTER — Encounter: Payer: Self-pay | Admitting: Cardiology

## 2023-03-19 ENCOUNTER — Encounter: Payer: Self-pay | Admitting: Physical Therapy

## 2023-03-19 ENCOUNTER — Ambulatory Visit: Payer: Medicaid Other | Admitting: Physical Therapy

## 2023-03-19 ENCOUNTER — Other Ambulatory Visit (HOSPITAL_COMMUNITY): Payer: Self-pay

## 2023-03-19 VITALS — BP 156/100 | HR 84 | Ht 60.0 in | Wt 196.0 lb

## 2023-03-19 DIAGNOSIS — R252 Cramp and spasm: Secondary | ICD-10-CM

## 2023-03-19 DIAGNOSIS — I10 Essential (primary) hypertension: Secondary | ICD-10-CM | POA: Diagnosis not present

## 2023-03-19 DIAGNOSIS — M5416 Radiculopathy, lumbar region: Secondary | ICD-10-CM | POA: Diagnosis not present

## 2023-03-19 DIAGNOSIS — E119 Type 2 diabetes mellitus without complications: Secondary | ICD-10-CM | POA: Diagnosis not present

## 2023-03-19 DIAGNOSIS — M25551 Pain in right hip: Secondary | ICD-10-CM

## 2023-03-19 DIAGNOSIS — E782 Mixed hyperlipidemia: Secondary | ICD-10-CM

## 2023-03-19 DIAGNOSIS — R0609 Other forms of dyspnea: Secondary | ICD-10-CM

## 2023-03-19 DIAGNOSIS — E669 Obesity, unspecified: Secondary | ICD-10-CM | POA: Diagnosis not present

## 2023-03-19 DIAGNOSIS — M6281 Muscle weakness (generalized): Secondary | ICD-10-CM

## 2023-03-19 MED ORDER — METOPROLOL SUCCINATE ER 50 MG PO TB24: 50.0000 mg | ORAL_TABLET | Freq: Every day | ORAL | 3 refills | Status: DC

## 2023-03-19 NOTE — Patient Instructions (Signed)
Please keep a BP log for 2 weeks and send by MyChart or mail.  Blood Pressure Record Sheet To take your blood pressure, you will need a blood pressure machine. You can buy a blood pressure machine (blood pressure monitor) at your clinic, drug store, or online. When choosing one, consider: An automatic monitor that has an arm cuff. A cuff that wraps snugly around your upper arm. You should be able to fit only one finger between your arm and the cuff. A device that stores blood pressure reading results. Do not choose a monitor that measures your blood pressure from your wrist or finger. Follow your health care provider's instructions for how to take your blood pressure. To use this form: Get one reading in the morning (a.m.) 1-2 hours after you take any medicines. Get one reading in the evening (p.m.) before supper.   Blood pressure log Date: _______________________  a.m. _____________________(1st reading) HR___________            p.m. _____________________(2nd reading) HR__________  Date: _______________________  a.m. _____________________(1st reading) HR___________            p.m. _____________________(2nd reading) HR__________  Date: _______________________  a.m. _____________________(1st reading) HR___________            p.m. _____________________(2nd reading) HR__________  Date: _______________________  a.m. _____________________(1st reading) HR___________            p.m. _____________________(2nd reading) HR__________  Date: _______________________  a.m. _____________________(1st reading) HR___________            p.m. _____________________(2nd reading) HR__________  Date: _______________________  a.m. _____________________(1st reading) HR___________            p.m. _____________________(2nd reading) HR__________  Date: _______________________  a.m. _____________________(1st reading) HR___________            p.m. _____________________(2nd reading)  HR__________   This information is not intended to replace advice given to you by your health care provider. Make sure you discuss any questions you have with your health care provider. Document Revised: 10/06/2019 Document Reviewed: 10/06/2019 Elsevier Patient Education  2021 [employer] Inc.   Medication Instructions:  Your physician has recommended you make the following change in your medication:   Start Toprol XL 50 mg daily.  Stop Amlopidine  *If you need a refill on your cardiac medications before your next appointment, please call your pharmacy*   Lab Work: None ordered If you have labs (blood work) drawn today and your tests are completely normal, you will receive your results only by: MyChart Message (if you have MyChart) OR A paper copy in the mail If you have any lab test that is abnormal or we need to change your treatment, we will call you to review the results.   Testing/Procedures: You are scheduled for a Myocardial Perfusion Imaging Study.  Please arrive 15 minutes prior to your appointment time for registration and insurance purposes.  The test will take approximately 3 to 4 hours to complete; you may bring reading material.  If someone comes with you to your appointment, they will need to remain in the main lobby due to limited space in the testing area.   How to prepare for your Myocardial Perfusion Test: Do not eat or drink 3 hours prior to your test, except you may have water. Do not consume products containing caffeine (regular or decaffeinated) 12 hours prior to your test. (ex: coffee, chocolate, sodas, tea). Do bring a list of your current medications with you.  If not listed below, you may  take your medications as normal. Do not take metoprolol (Lopressor, Toprol) for 24 hours prior to the test.  Bring the medication to your appointment as you may be required to take it once the test is complete. Do wear comfortable clothes (no dresses or overalls) and  walking shoes, tennis shoes preferred (No heels or open toe shoes are allowed). Do NOT wear cologne, perfume, aftershave, or lotions (deodorant is allowed). If these instructions are not followed, your test will have to be rescheduled.  If you cannot keep your appointment, please provide 24 hours notification to the Nuclear Lab, to avoid a possible $50 charge to your account.  Follow-Up: At [facility], you and your health needs are our priority.  As part of our continuing mission to provide you with exceptional heart care, we have created designated Provider Care Teams.  These Care Teams include your primary Cardiologist (physician) and Advanced Practice Providers (APPs -  Physician Assistants and Nurse Practitioners) who all work together to provide you with the care you need, when you need it.  We recommend signing up for the patient portal called "MyChart".  Sign up information is provided on this After Visit Summary.  MyChart is used to connect with patients for Virtual Visits (Telemedicine).  Patients are able to view lab/test results, encounter notes, upcoming appointments, etc.  Non-urgent messages can be sent to your provider as well.   To learn more about what you can do with MyChart, go to [email or web address].    Your next appointment:   9 month(s)  Provider:   Belva Crome, MD   Other Instructions  Cardiac Nuclear Scan A cardiac nuclear scan is a test that is done to check the flow of blood to your heart. It is done when you are resting and when you are exercising. The test looks for problems such as: Not enough blood reaching a portion of the heart. The heart muscle not working as it should. You may need this test if you have: Heart disease. Lab results that are not normal. Had heart surgery or a balloon procedure to open up blocked arteries (angioplasty) or a small mesh tube (stent). Chest pain. Shortness of breath. Had a heart attack. In this test, a  special dye (tracer) is put into your bloodstream. The tracer will travel to your heart. A camera will then take pictures of your heart to see how the tracer moves through your heart. This test is usually done at a hospital and takes 2-4 hours. Tell a doctor about: Any allergies you have. All medicines you are taking, including vitamins, herbs, eye drops, creams, and over-the-counter medicines. Any bleeding problems you have. Any surgeries you have had. Any medical conditions you have. Whether you are pregnant or may be pregnant. Any history of asthma or long-term (chronic) lung disease. Any history of heart rhythm disorders or heart valve conditions. What are the risks? Your doctor will talk with you about risks. These may include: Serious chest pain and heart attack. This is only a risk if the stress portion of the test is done. Fast or uneven heartbeats (palpitations). A feeling of warmth in your chest. This feeling usually does not last long. Allergic reaction to the tracer. Shortness of breath or trouble breathing. What happens before the test? Ask your doctor about changing or stopping your normal medicines. Follow instructions from your doctor about what you cannot eat or drink. Remove your jewelry on the day of the test. Ask your doctor if you need  to avoid nicotine or caffeine. What happens during the test? An IV tube will be inserted into one of your veins. Your doctor will give you a small amount of tracer through the IV tube. You will wait for 20-40 minutes while the tracer moves through your bloodstream. Your heart will be monitored with an electrocardiogram (ECG). You will lie down on an exam table. Pictures of your heart will be taken for about 15-20 minutes. You may also have a stress test. For this test, one of these things may be done: You will be asked to exercise on a treadmill or a stationary bike. You will be given medicines that will make your heart work harder.  This is done if you are unable to exercise. When blood flow to your heart has peaked, a tracer will again be given through the IV tube. After 20-40 minutes, you will get back on the exam table. More pictures will be taken of your heart. Depending on the tracer that is used, more pictures may need to be taken 3-4 hours later. Your IV tube will be removed when the test is over. The test may vary among doctors and hospitals. What happens after the test? Ask your doctor: Whether you can return to your normal schedule, including diet, activities, travel, and medicines. Whether you should drink more fluids. This will help to remove the tracer from your body. Ask your doctor, or the department that is doing the test: When will my results be ready? How will I get my results? What are my treatment options? What other tests do I need? What are my next steps? This information is not intended to replace advice given to you by your health care provider. Make sure you discuss any questions you have with your health care provider. Document Revised: 11/13/2021 Document Reviewed: 11/13/2021 Elsevier Patient Education  2023 [employer].

## 2023-03-19 NOTE — Telephone Encounter (Signed)
PA request has been Submitted. New Encounter created for follow up. For additional info see Pharmacy Prior Auth telephone encounter from 03/19/2023.

## 2023-03-19 NOTE — Progress Notes (Signed)
Cardiology Office Note:    Date:  03/19/2023   ID:  Lauren Soto, DOB [birth year], MRN [id number]  PCP:  Clayborne Dana, NP  Cardiologist:  Garwin Brothers, MD   Referring MD: Clayborne Dana, NP    ASSESSMENT:    1. DOE (dyspnea on exertion)   2. Essential hypertension   3. Diabetes mellitus without complication (HCC)   4. Mixed hyperlipidemia   5. Obesity (BMI 35.0-39.9 without comorbidity)    PLAN:    In order of problems listed above:  Primary prevention stressed with the patient.  Importance of compliance with diet medication stressed and patient verbalized standing. Essential hypertension: Blood pressure is elevated.  Salt, dietary issues were discussed.  Lifestyle modification was urged.  She promises to do better.  I will discontinue Norvasc because she has some issues with pedal edema also.  Today she does not have any pedal edema.  Will initiate Toprol-XL 50 mg daily and she will bring in vital signs log when she comes for the stress test. Dyspnea on exertion: Will do exercise stress Cardiolite to understand her symptoms.  She is agreeable. Mixed dyslipidemia: On lipid-lowering medications followed by primary care.  Lipids are at goal and I reviewed from [facility] sheet. Diabetes mellitus: Elevated hemoglobin A1c and I cautioned against this.  Weight reduction stressed diet emphasized.  Risks of obesity explained. Patient will be seen in follow-up appointment in 6 months or earlier if the patient has any concerns.    Medication Adjustments/Labs and Tests Ordered: Current medicines are reviewed at length with the patient today.  Concerns regarding medicines are outlined above.  Orders Placed This Encounter  Procedures   EKG 12-Lead   No orders of the defined types were placed in this encounter.    History of Present Illness:    Lauren Soto is a 52 y.o. female who is being seen today for the evaluation of dyspnea on exertion at the request of Clayborne Dana, NP.  Patient is a pleasant 52 year old female.  She has past medical history of essential hypertension, dyslipidemia and diabetes mellitus.  She mentions to me that she is getting short of breath on exertion.  She is morbidly obese.  She has started walking some on a regular basis.  No chest pain orthopnea or PND.  At the time of my evaluation, the patient is alert awake oriented and in no distress.  Past Medical History:  Diagnosis Date   Acute cystitis 02/03/2023   AKI (acute kidney injury) (HCC) 02/02/2023   Asthma 04/21/2014   Chronic right hip pain 11/06/2022   Diabetes mellitus without complication (HCC)    Essential hypertension 05/25/2012   GERD (gastroesophageal reflux disease) 02/03/2023   Hyperlipidemia 11/06/2022   Hypertension    Long term current use of oral hypoglycemic drug 11/06/2022   Menorrhagia 06/08/2012   Non-insulin dependent type 2 diabetes mellitus (HCC) 06/18/2018   Pelvic pain in female 06/08/2012   Postural dizziness with presyncope 02/03/2023   Severe sepsis (HCC) 02/03/2023   Uterine fibroid 05/25/2012   Vertigo    Vestibular migraine 07/06/2018    Past Surgical History:  Procedure Laterality Date   ABDOMINAL HYSTERECTOMY     BUNIONECTOMY     CHOLECYSTECTOMY     HERNIA REPAIR     umblical   SHOULDER ARTHROSCOPY      Current Medications: Current Meds  Medication Sig   amLODipine (NORVASC) 2.5 MG tablet Take 1 tablet (2.5 mg total) by mouth  daily.   atorvastatin (LIPITOR) 20 MG tablet Take 1 tablet (20 mg total) by mouth daily.   diclofenac Sodium (VOLTAREN) 1 % GEL Apply 2 g topically 4 (four) times daily as needed (pain).   lisinopril (ZESTRIL) 40 MG tablet Take 1 tablet (40 mg total) by mouth daily.   meclizine (ANTIVERT) 25 MG tablet Take 1 tablet (25 mg total) by mouth 3 (three) times daily as needed for dizziness.   meloxicam (MOBIC) 15 MG tablet Take 15 mg by mouth daily.   metFORMIN (GLUCOPHAGE) 850 MG tablet Take 1 tablet (850  mg total) by mouth 2 (two) times daily.   nitroGLYCERIN (NITROSTAT) 0.4 MG SL tablet Place 1 tablet (0.4 mg total) under the tongue every 5 (five) minutes as needed for chest pain.   omeprazole (PRILOSEC) 20 MG capsule Take 1 capsule (20 mg total) by mouth every morning.   pregabalin (LYRICA) 100 MG capsule Take 1 capsule (100 mg total) by mouth daily.   rizatriptan (MAXALT-MLT) 10 MG disintegrating tablet Take 1 tablet (10 mg total) by mouth as needed for migraine (May repeat in 2 hours.  Maximum 2 tablets in 24 hours.). May repeat in 2 hours if needed     Allergies:   Patient has no known allergies.   Social History   Socioeconomic History   Marital status: Married    Spouse name: Not on file   Number of children: Not on file   Years of education: Not on file   Highest education level: Not on file  Occupational History   Not on file  Tobacco Use   Smoking status: Former    Current packs/day: 0.00    Types: Cigarettes    Quit date: 2009    Years since quitting: 15.7   Smokeless tobacco: Never   Tobacco comments:    "Every once in awhile will vape"  Vaping Use   Vaping status: Former   Quit date: 07/04/2022  Substance and Sexual Activity   Alcohol use: No   Drug use: No   Sexual activity: Yes    Birth control/protection: Surgical  Other Topics Concern   Not on file  Social History Narrative   Are you right handed or left handed? Right   Are you currently employed ? NO   What is your current occupation?   Do you live at home alone?   Who lives with you?    What type of home do you live in: 1 story or 2 story? 1       Social Determinants of Health   Financial Resource Strain: Not on file  Food Insecurity: No Food Insecurity (02/03/2023)   Hunger Vital Sign    Worried About Running Out of Food in the Last Year: Never true    Ran Out of Food in the Last Year: Never true  Transportation Needs: No Transportation Needs (02/03/2023)   PRAPARE - Scientist, research (physical sciences) (Medical): No    Lack of Transportation (Non-Medical): No  Physical Activity: Not on file  Stress: Not on file  Social Connections: Not on file     Family History: The patient's family history includes Arthritis in her mother; Dementia in her father and mother; Hypertension in her father and mother; Multiple myeloma in her mother; Neuropathy in her mother.  ROS:   Please see the history of present illness.    All other systems reviewed and are negative.  EKGs/Labs/Other Studies Reviewed:    The following studies  were reviewed today:  EKG Interpretation Date/Time:  Wednesday March 19 2023 14:40:10 EDT Ventricular Rate:  84 PR Interval:  134 QRS Duration:  72 QT Interval:  388 QTC Calculation: 458 R Axis:   81  Text Interpretation: Normal sinus rhythm Nonspecific T wave abnormality When compared with ECG of 02-Feb-2023 17:48, PREVIOUS ECG IS PRESENT Confirmed by Belva Crome [id number]) on 03/19/2023 2:47:33 PM     Recent Labs: 11/06/2022: TSH 0.94 02/13/2023: ALT 15; BUN 7; Creatinine, Ser 0.82; Hemoglobin 12.9; Platelets 346.0; Potassium 3.4; Sodium 136  Recent Lipid Panel    Component Value Date/Time   CHOL 157 11/06/2022 1443   TRIG 154.0 (H) 11/06/2022 1443   HDL 51.00 11/06/2022 1443   CHOLHDL 3 11/06/2022 1443   VLDL 30.8 11/06/2022 1443   LDLCALC 75 11/06/2022 1443    Physical Exam:    VS:  BP (!) 156/100   Pulse 84   Ht 5' (1.524 m)   Wt 196 lb (88.9 kg)   LMP 06/04/2012   SpO2 96%   BMI 38.28 kg/m     Wt Readings from Last 3 Encounters:  03/19/23 196 lb (88.9 kg)  02/27/23 192 lb (87.1 kg)  02/25/23 194 lb (88 kg)     GEN: Patient is in no acute distress HEENT: Normal NECK: No JVD; No carotid bruits LYMPHATICS: No lymphadenopathy CARDIAC: S1 S2 regular, 2/6 systolic murmur at the apex. RESPIRATORY:  Clear to auscultation without rales, wheezing or rhonchi  ABDOMEN: Soft, non-tender, non-distended MUSCULOSKELETAL:  No edema; No  deformity  SKIN: Warm and dry NEUROLOGIC:  Alert and oriented x 3 PSYCHIATRIC:  Normal affect    Signed, Garwin Brothers, MD  03/19/2023 2:49 PM    [facility]

## 2023-03-19 NOTE — Telephone Encounter (Signed)
Pharmacy Patient Advocate Encounter   Received notification from Pt Calls Messages that prior authorization for AJOVY (fremanezumab-vfrm) injection 225MG /1.5ML auto-injectors is required/requested.   Insurance verification completed.   The patient is insured through [facility] .   Per test claim: PA required; PA submitted to [facility] via CoverMyMeds Key/confirmation #/EOC [id number] Status is pending

## 2023-03-19 NOTE — Addendum Note (Signed)
Addended by: Eleonore Chiquito on: 03/19/2023 03:05 PM   Modules accepted: Orders

## 2023-03-20 ENCOUNTER — Encounter (HOSPITAL_COMMUNITY): Payer: Self-pay

## 2023-03-20 NOTE — Telephone Encounter (Signed)
Left detailed message with instructions about a STRESS TEST scheduled for 03/21/23 at 1:00. I also reminded patient not to take the Metoprolol for 24 hours before her study. Should she have questions or needed to reschedule to please give [place] a call.

## 2023-03-21 ENCOUNTER — Ambulatory Visit (HOSPITAL_COMMUNITY): Payer: Medicaid Other

## 2023-03-21 ENCOUNTER — Encounter (HOSPITAL_COMMUNITY): Payer: Self-pay

## 2023-03-24 ENCOUNTER — Encounter: Payer: Self-pay | Admitting: Physical Therapy

## 2023-03-24 ENCOUNTER — Ambulatory Visit: Payer: Medicaid Other | Admitting: Physical Therapy

## 2023-03-24 DIAGNOSIS — M25551 Pain in right hip: Secondary | ICD-10-CM

## 2023-03-24 DIAGNOSIS — M5416 Radiculopathy, lumbar region: Secondary | ICD-10-CM

## 2023-03-24 DIAGNOSIS — M6281 Muscle weakness (generalized): Secondary | ICD-10-CM

## 2023-03-24 DIAGNOSIS — R252 Cramp and spasm: Secondary | ICD-10-CM

## 2023-03-24 NOTE — Therapy (Signed)
OUTPATIENT PHYSICAL THERAPY THORACOLUMBAR TREATMENT   Patient Name: Lauren Soto MRN: [id number] DOB:1971-01-09, 52 y.o., female Today's Date: 03/24/2023  END OF SESSION:  PT End of Session - 03/24/23 1145     Visit Number 5    Date for PT Re-Evaluation 04/16/23    Authorization Type [employer] Medicaid    PT Start Time 1145    PT Stop Time 1240    PT Time Calculation (min) 55 min    Activity Tolerance Patient tolerated treatment well    Behavior During Therapy [facility] for tasks assessed/performed               Past Medical History:  Diagnosis Date   Acute cystitis 02/03/2023   AKI (acute kidney injury) (HCC) 02/02/2023   Asthma 04/21/2014   Chronic right hip pain 11/06/2022   Diabetes mellitus without complication (HCC)    Essential hypertension 05/25/2012   GERD (gastroesophageal reflux disease) 02/03/2023   Hyperlipidemia 11/06/2022   Hypertension    Long term current use of oral hypoglycemic drug 11/06/2022   Menorrhagia 06/08/2012   Non-insulin dependent type 2 diabetes mellitus (HCC) 06/18/2018   Pelvic pain in female 06/08/2012   Postural dizziness with presyncope 02/03/2023   Severe sepsis (HCC) 02/03/2023   Uterine fibroid 05/25/2012   Vertigo    Vestibular migraine 07/06/2018   Past Surgical History:  Procedure Laterality Date   ABDOMINAL HYSTERECTOMY     BUNIONECTOMY     CHOLECYSTECTOMY     HERNIA REPAIR     umblical   SHOULDER ARTHROSCOPY     Patient Active Problem List   Diagnosis Date Noted   Obesity (BMI 35.0-39.9 without comorbidity) 03/19/2023   Diabetes mellitus without complication (HCC)    Hypertension    Vertigo    Severe sepsis (HCC) 02/03/2023   Postural dizziness with presyncope 02/03/2023   GERD (gastroesophageal reflux disease) 02/03/2023   Acute cystitis 02/03/2023   AKI (acute kidney injury) (HCC) 02/02/2023   Long term current use of oral hypoglycemic drug 11/06/2022   Chronic right hip pain 11/06/2022   Hyperlipidemia  11/06/2022   Vestibular migraine 07/06/2018   Non-insulin dependent type 2 diabetes mellitus (HCC) 06/18/2018   Asthma 04/21/2014   Menorrhagia 06/08/2012   Pelvic pain in female 06/08/2012   Essential hypertension 05/25/2012   Uterine fibroid 05/25/2012    PCP: Clayborne Dana, NP   REFERRING PROVIDER: Cammy Copa, MD   REFERRING DIAG: M54.16 (ICD-10-CM) - Lumbar radiculopathy   Rationale for Evaluation and Treatment: Rehabilitation  THERAPY DIAG:  Radiculopathy, lumbar region  Cramp and spasm  Muscle weakness (generalized)  Pain in right hip  ONSET DATE: chronic low back pain years, Leg pain started November 2023.   SUBJECTIVE:  SUBJECTIVE STATEMENT: Still having pain down her leg from the Dry needling so doesn't want to do that any more.  7/10.  Back is still bothering her a lot.  She tries to do what she can then sits down.  Rib pain a little better, notices it when turns and reaches.   Eval: Pain has been ongoing for about a year.  Pain is in back, right hip and leg radiating to down R calf, mild pain into groin as well. Had an epidural steroid injection with Dr. Alvester Morin on 01/21/2023, which helped 1-2 days.  Also was given a cortisone injection in her hip on 11/29/22.  It didn't help either.  Pain seems to be spreading and she's not sure if its coming from her back or hip.  Chronic back pain has been going on for years, has been checked for RA and that was ruled out.  The leg pain started in November.   PERTINENT HISTORY:  T2DM, HTN, vertigo & migraines, chronic LBP, history of abdominal hysterectomy and umbilical hernia repair.   PAIN:  Are you having pain? Yes: NPRS scale: 8/10 Pain location: midline low back, radiates up, and down Right leg into groin, aldo buttock down to knee,  sometimes to calf.  Pain description: throbbing, achey, sharp in groin Aggravating factors: pain increases past 10/10, increased activity, moving a lot or prolonged sitting Relieving factors: sometimes a heating pad  PRECAUTIONS: None  RED FLAGS: None   WEIGHT BEARING RESTRICTIONS: No  FALLS:  Has patient fallen in last 6 months? No however R knee occasionally buckles/feels weak.   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 Subtle anterolisthesis at L4-L5 with at least moderate facet arthropathy. No spinal stenosis but mild lateral recess and left foraminal stenosis at that level. 2. L5-S1 disc degeneration with a small rightward disc herniation most affecting the right lateral recess. Query right S1 radiculitis. 3. Partially visible lower thoracic disc degeneration with up to mild lower thoracic spinal stenosis. Visible lower spinal cord at those levels seems to remain normal.  11/24/22 MR Pelvis IMPRESSION: Moderate tendinosis with partial tearing of the proximal hamstring tendons bilaterally.   Mild tendinosis of the distal gluteus minimus tendons bilaterally with associated peritrochanteric edema, left greater than right.   Mild osteoarthritis of the hips.  PATIENT SURVEYS:  Modified Oswestry 27/50= 54% severe   COGNITION: Overall cognitive status: Within functional limits for tasks assessed     SENSATION: Not tested  MUSCLE LENGTH: Hamstrings: severe tightness bil  Quadriceps: severe tightness bil   POSTURE:  sits with weight shift to left  PALPATION: Tenderness with PA mob to L3-5, sacrum, bil SIJ tenderness throughout lumbar paraspinals, QL  R gluteals, piriformis and greater trochanter,   LUMBAR ROM:   AROM eval  Flexion To knees, p!  Extension Limited 90!, p!  Right lateral flexion To mid thigh P!  Left lateral flexion To mid thigh p!  Right rotation Limited 50%  Left rotation Limited 50%   (Blank rows = not tested)  LOWER EXTREMITY ROM:   good hip IR/ER in R hip but painful, decreased hip IR on L compared to R   LOWER EXTREMITY MMT:    MMT Right  eval Left eval  Hip flexion 3+ 4+  Hip extension 4- 4-  Hip abduction 4+ 5  Hip adduction 4+ 4+  Knee flexion 4 5  Knee extension 4+ 5  Ankle dorsiflexion 5 5  Ankle plantarflexion 5 5   (Blank rows = not tested)  LUMBAR SPECIAL TESTS:  Straight leg raise test: positive on R and cross straight leg, Slump test: Positive, and FABER test: Positive  FUNCTIONAL TESTS:   GAIT: Distance walked: 25' Assistive device utilized: None Level of assistance: Complete Independence Comments: visually slow, no significant deviation but does report intermittent R knee buckling.   TODAY'S TREATMENT:                                                                                                                              DATE:   03/24/23  Therapeutic Exercise: to improve strength and mobility.  Demo, verbal and tactile cues throughout for technique.  Nustep L5 x 6 min while status assessed  Side glides - left hip to wall was preference Lumbar extensions on counter Manual Therapy: to decrease muscle spasm and pain and improve mobility STM/TPR to bil QL, R piriformis and glutes, R UPA mobs lumbar spine grade 2-3, IASTM with foam roller to bil lumbar paraspinals, glutes, and proximal hamstrings.   Modalities: IFC to lumbar paraspinals + MHP in prone x 15 min   03/19/23 Nustep L5 x 6 status assessed. Hooklying diaphragmatic breathing with 2:1 inhale to exhale ratio - working to release tight right diaphragm and decrease rib pain.   Manual Therapy: to decrease muscle spasm and  pain and improve mobility. TPR to R QL and R diaphragm. Skilled palpation and monitoring of soft tissues during DN. STM to R gluteals post DN. Trigger Point Dry-Needling  Treatment instructions: Expect mild to moderate muscle soreness. S/S of pneumothorax if dry needled over a lung field, and to seek immediate medical attention should they occur. Patient verbalized understanding of these instructions and education. Patient Consent Given: Yes Education handout provided: Yes Muscles treated: R glue min/med and piriformis, R QL Electrical stimulation performed: No Parameters: N/A Treatment response/outcome: Twitch Response Elicited and Palpable Increase in Muscle Length     03/17/23  Self Care:; Trialing different canes for upcoming trip- single point, quad cane, and walking poles, pros and cons for each, cues for posture and cane placement.  Also education on TENS. Manual Therapy: to decrease muscle spasm and pain and improve mobility STM/TPR to low back, UPA mobs to lumbar spine, IASTM with foam roller to bil glutes, QL, erector spinea and proximal hamstrings.  Modalities: Estim (IFC) to low back with MHP x 10 min in prone   PATIENT EDUCATION:  Education details: continue HEP especially lumbar extensions and side glides  Person educated: Patient Education method: Explanation Education comprehension: verbalized understanding and returned demonstration  HOME EXERCISE PROGRAM: Access Code: [email or web address]  URL: [email or web address] Date: 03/12/2023 Prepared by: Raynelle Fanning  Exercises - Supine Posterior Pelvic  Tilt  - 1-2 x daily - 7 x weekly - 1 sets - 10 reps - Supine Bridge  - 2 x daily - 7 x weekly - 1-3 sets - 10 reps - Right Standing Lateral Shift Correction at Wall - Hold  - 2 x daily - 7 x weekly - 1 sets - 10 reps - 5-10 sec hold - Lying Prone with 1 Pillow  - 1 x daily - 3-4 x weekly - 1 sets - 10 reps   Pelvic Press     Place hands under belly between navel and pubic  bone, palms up. Feel pressure on hands. Increase pressure on hands by pressing pelvis down. This is NOT a pelvic tilt. Hold __5_ seconds. Relax. Repeat _10__ times. Once a day.  KNEE: Flexion - Prone   Hold pelvic press. Bend knee, then return the foot down. Repeat on opposite leg. Do not raise hips. _5-10__ reps per set. When this is mastered, pull both heels up at same time, x 10 reps.  Once a day   Leg Lift: One-Leg   Press pelvis down. Keep knee straight; lengthen and lift one leg (from waist). Do not twist body. Keep other leg down. Hold _2__ seconds. Relax. Repeat 10 time. Repeat with other leg.    ASSESSMENT:  CLINICAL IMPRESSION: Lauren Soto reported poor tolerance to TrDN last session and continues to have increased RLE pain following, so reassured did not have to perform again.  Today focused on standing Mckenzie exercises starting with side shift correction then extension, these decreased pain significantly, followed by manual therapy then Estim, to decrease pain.  Lauren Soto continues to demonstrate potential for improvement and would benefit from continued skilled therapy to address impairments.      OBJECTIVE IMPAIRMENTS: decreased activity tolerance, decreased endurance, decreased mobility, difficulty walking, decreased ROM, decreased strength, increased fascial restrictions, impaired perceived functional ability, increased muscle spasms, impaired flexibility, postural dysfunction, and pain.   ACTIVITY LIMITATIONS: carrying, lifting, bending, sitting, standing, squatting, sleeping, transfers, bed mobility, and locomotion level  PARTICIPATION LIMITATIONS: meal prep, cleaning, laundry, shopping, community activity, and occupation  PERSONAL FACTORS: Time since onset of injury/illness/exacerbation and 3+ comorbidities: T2DM, HTN, vertigo & migraines, chronic LBP, history of abdominal hysterectomy and umbilical hernia repair.  are also affecting  patient's functional outcome.   REHAB POTENTIAL: Good  CLINICAL DECISION MAKING: Evolving/moderate complexity  EVALUATION COMPLEXITY: Moderate   GOALS: Goals reviewed with patient? Yes  SHORT TERM GOALS: Target date: 03/19/2023   Patient will be independent with initial HEP.  Baseline:  Goal status: MET 03/17/23   LONG TERM GOALS: Target date: 04/16/2023   Patient will be independent with advanced/ongoing HEP to improve outcomes and carryover.  Baseline:  Goal status: IN PROGRESS  2.  Patient will report 75% improvement in low back pain to improve QOL.  Baseline:  Goal status: IN PROGRESS  3.  Patient will demonstrate full pain free lumbar ROM to perform ADLs.   Baseline: see objective Goal status: IN PROGRESS  4.  Patient will demonstrate improved functional strength as demonstrated by 4+/5 R hip strength. Baseline: see objective Goal status: IN PROGRESS  5.  Patient will report at least 6 points improvement on modified Oswestry to demonstrate improved functional ability.  Baseline: 27/50 Goal status: IN PROGRESS   6.  Patient will report being able to sleep without interruption from LBP to improve QOL. Baseline: reports sleeping only 1/4 normal amount due to pain Goal status: IN PROGRESS  7.  Patient will report centralization of radicular symptoms.  Baseline: radiates down RLE to calf and groin Goal status: IN PROGRESS  PLAN:  PT FREQUENCY: 1-2x/week  PT DURATION: 6 weeks  PLANNED INTERVENTIONS: Therapeutic exercises, Therapeutic activity, Neuromuscular re-education, Balance training, Gait training, Patient/Family education, Self Care, Joint mobilization, Joint manipulation, Orthotic/Fit training, Dry Needling, Electrical stimulation, Spinal manipulation, Spinal mobilization, Cryotherapy, Moist heat, Taping, Traction, Ultrasound, Manual therapy, and Re-evaluation.  PLAN FOR NEXT SESSION: Continue to progress therex - tolerating mckenzie well, needs more core  strengthening, manual and modalities PRN, did not respond well to TrDN.    Jena Gauss, PT, DPT  03/24/2023, 4:45 PM

## 2023-03-25 NOTE — Telephone Encounter (Signed)
Spoke with patient and again gave detailed instructions about her STRESS TEST on 03/28/23 at 10:00. Patient was also reminded not to take her Metoprolol 24 hours prior to her test.

## 2023-03-26 NOTE — Addendum Note (Signed)
Addended by: Belva Crome R on: 03/26/2023 07:55 AM   Modules accepted: Orders

## 2023-03-26 NOTE — Telephone Encounter (Signed)
Patient called to check the status of her PA ajovy.   PA team status of PA

## 2023-03-27 ENCOUNTER — Encounter: Payer: Medicaid Other | Admitting: Physical Therapy

## 2023-03-27 ENCOUNTER — Encounter: Payer: Medicaid Other | Admitting: Family Medicine

## 2023-03-28 ENCOUNTER — Encounter: Payer: Self-pay | Admitting: Family Medicine

## 2023-03-28 ENCOUNTER — Ambulatory Visit (HOSPITAL_COMMUNITY): Payer: Medicaid Other

## 2023-03-28 ENCOUNTER — Ambulatory Visit (INDEPENDENT_AMBULATORY_CARE_PROVIDER_SITE_OTHER): Payer: Medicaid Other | Admitting: Family Medicine

## 2023-03-28 VITALS — BP 149/93 | HR 86 | Ht 60.0 in | Wt 196.0 lb

## 2023-03-28 DIAGNOSIS — Z111 Encounter for screening for respiratory tuberculosis: Secondary | ICD-10-CM | POA: Diagnosis not present

## 2023-03-28 DIAGNOSIS — L989 Disorder of the skin and subcutaneous tissue, unspecified: Secondary | ICD-10-CM

## 2023-03-28 DIAGNOSIS — Z0001 Encounter for general adult medical examination with abnormal findings: Secondary | ICD-10-CM | POA: Diagnosis not present

## 2023-03-28 DIAGNOSIS — Z0000 Encounter for general adult medical examination without abnormal findings: Secondary | ICD-10-CM

## 2023-03-28 LAB — COMPREHENSIVE METABOLIC PANEL
ALT: 11 U/L (ref 0–35)
AST: 12 U/L (ref 0–37)
Albumin: 3.8 g/dL (ref 3.5–5.2)
Alkaline Phosphatase: 135 U/L — ABNORMAL HIGH (ref 39–117)
BUN: 7 mg/dL (ref 6–23)
CO2: 28 meq/L (ref 19–32)
Calcium: 9.2 mg/dL (ref 8.4–10.5)
Chloride: 104 meq/L (ref 96–112)
Creatinine, Ser: 0.63 mg/dL (ref 0.40–1.20)
GFR: 102.58 mL/min (ref >60.00)
Glucose, Bld: 162 mg/dL — ABNORMAL HIGH (ref 70–99)
Potassium: 3.7 meq/L (ref 3.5–5.1)
Sodium: 142 meq/L (ref 135–145)
Total Bilirubin: 0.4 mg/dL (ref 0.2–1.2)
Total Protein: 6.5 g/dL (ref 6.0–8.3)

## 2023-03-28 NOTE — Progress Notes (Signed)
Complete physical exam  Patient: Lauren Soto   DOB: [birth year]   52 y.o. Female  MRN: [id number]  Subjective:    Chief Complaint  Patient presents with   Annual Exam    Lauren Soto is a 52 y.o. female who presents today for a complete physical exam. She reports consuming a  heart healthy diabetic  diet. Home exercise routine includes walking, physical therapy. She generally feels well. She reports sleeping well. She does not have additional problems to discuss today.   Currently lives with: spouse Acute concerns or interim problems since last visit: no  Vision concerns: no Dental concerns: no STD concerns: no  ETOH use: no Nicotine use: no Recreational drugs/illegal substances: no   Females:  She is currently  sexually active  Contraception choices are: hysterectomy LMP: n/a      Most recent fall risk assessment:    03/28/2023    8:55 AM  Fall Risk   Falls in the past year? 0  Number falls in past yr: 0  Injury with Fall? 0  Risk for fall due to : No Fall Risks  Follow up Falls evaluation completed     Most recent depression screenings:    03/28/2023    8:55 AM 11/06/2022    3:41 PM  PHQ 2/9 Scores  PHQ - 2 Score 3 2  PHQ- 9 Score 12 8            Patient Care Team: Clayborne Dana, NP as PCP - General (Family Medicine)   Outpatient Medications Prior to Visit  Medication Sig   atorvastatin (LIPITOR) 20 MG tablet Take 1 tablet (20 mg total) by mouth daily.   diclofenac Sodium (VOLTAREN) 1 % GEL Apply 2 g topically 4 (four) times daily as needed (pain).   lisinopril (ZESTRIL) 40 MG tablet Take 1 tablet (40 mg total) by mouth daily.   meclizine (ANTIVERT) 25 MG tablet Take 1 tablet (25 mg total) by mouth 3 (three) times daily as needed for dizziness.   meloxicam (MOBIC) 15 MG tablet Take 15 mg by mouth daily.   metFORMIN (GLUCOPHAGE) 850 MG tablet Take 1 tablet (850 mg total) by mouth 2 (two) times daily.   metoprolol succinate  (TOPROL-XL) 50 MG 24 hr tablet Take 1 tablet (50 mg total) by mouth daily. Take with or immediately following a meal.   omeprazole (PRILOSEC) 20 MG capsule Take 1 capsule (20 mg total) by mouth every morning.   ondansetron (ZOFRAN-ODT) 4 MG disintegrating tablet Take 1 tablet (4 mg total) by mouth every 8 (eight) hours as needed.   pregabalin (LYRICA) 100 MG capsule Take 1 capsule (100 mg total) by mouth daily.   rizatriptan (MAXALT-MLT) 10 MG disintegrating tablet Take 1 tablet (10 mg total) by mouth as needed for migraine (May repeat in 2 hours.  Maximum 2 tablets in 24 hours.). May repeat in 2 hours if needed   nitroGLYCERIN (NITROSTAT) 0.4 MG SL tablet Place 1 tablet (0.4 mg total) under the tongue every 5 (five) minutes as needed for chest pain. (Patient not taking: Reported on 03/28/2023)   [DISCONTINUED] Fremanezumab-vfrm (AJOVY) 225 MG/1.5ML SOAJ Inject 225 mg into the skin every 28 (twenty-eight) days. (Patient not taking: Reported on 02/27/2023)   No facility-administered medications prior to visit.    ROS  All review of systems negative except what is listed in the HPI       Objective:     BP (!) 149/93   Pulse 86  Ht 5' (1.524 m)   Wt 196 lb (88.9 kg)   LMP 06/04/2012   SpO2 99%   BMI 38.28 kg/m    Physical Exam Vitals reviewed.  Constitutional:      General: She is not in acute distress.    Appearance: Normal appearance. She is not ill-appearing.  HENT:     Head: Normocephalic and atraumatic.     Right Ear: Tympanic membrane normal.     Left Ear: Tympanic membrane normal.     Nose: Nose normal.     Mouth/Throat:     Mouth: Mucous membranes are moist.     Pharynx: Oropharynx is clear.  Eyes:     Extraocular Movements: Extraocular movements intact.     Conjunctiva/sclera: Conjunctivae normal.     Pupils: Pupils are equal, round, and reactive to light.  Neck:     Vascular: No carotid bruit.  Cardiovascular:     Rate and Rhythm: Normal rate and regular  rhythm.     Pulses: Normal pulses.     Heart sounds: Normal heart sounds.  Pulmonary:     Effort: Pulmonary effort is normal.     Breath sounds: Normal breath sounds.  Abdominal:     General: Abdomen is flat. Bowel sounds are normal. There is no distension.     Palpations: Abdomen is soft. There is no mass.     Tenderness: There is no abdominal tenderness. There is no right CVA tenderness, left CVA tenderness, guarding or rebound.  Genitourinary:    Comments: Deferred exam Musculoskeletal:        General: Normal range of motion.     Cervical back: Normal range of motion and neck supple. No tenderness.     Right lower leg: No edema.     Left lower leg: No edema.  Feet:     Comments: 2-3 areas on bilateral plantar feet with nodule/cyst, tender, no skin changes Lymphadenopathy:     Cervical: No cervical adenopathy.  Skin:    General: Skin is warm and dry.     Capillary Refill: Capillary refill takes less than 2 seconds.  Neurological:     General: No focal deficit present.     Mental Status: She is alert and oriented to person, place, and time. Mental status is at baseline.  Psychiatric:        Mood and Affect: Mood normal.        Behavior: Behavior normal.        Thought Content: Thought content normal.        Judgment: Judgment normal.          No results found for any visits on 03/28/23.     Assessment & Plan:    Routine Health Maintenance and Physical Exam Discussed health promotion and safety including diet and exercise recommendations, dental health, and injury prevention. Tobacco cessation if applicable. Seat belts, sunscreen, smoke detectors, etc.     There is no immunization history on file for this patient.  Health Maintenance  Topic Date Due   OPHTHALMOLOGY EXAM  Never done   Cervical Cancer Screening (HPV/Pap Cotest)  03/10/2017   Zoster Vaccines- Shingrix (1 of 2) Never done   INFLUENZA VACCINE  Never done   COVID-19 Vaccine (1 - 2023-24 season)  03/27/2024 (Originally 03/02/2023)   HEMOGLOBIN A1C  08/06/2023   Diabetic kidney evaluation - Urine ACR  11/06/2023   FOOT EXAM  11/06/2023   Diabetic kidney evaluation - eGFR measurement  02/13/2024   MAMMOGRAM  05/03/2024   Colonoscopy  09/12/2032   Hepatitis C Screening  Completed   HIV Screening  Completed   HPV VACCINES  Aged Out   DTaP/Tdap/Td  Discontinued        Problem List Items Addressed This Visit   None Visit Diagnoses     Annual physical exam    -  Primary   Relevant Orders   Comp Met (CMET)   Screening-pulmonary TB       Relevant Orders   QuantiFERON-TB Gold Plus   Foot lesion       Relevant Orders   Ambulatory referral to Podiatry      Blood pressure is not at goal for age and co-morbidities. (Has not taken meds yet today) Recommendations: continue meds, nurse visit recheck in 2 weeks  - BP goal <130/80 - monitor and log blood pressures at home - check around the same time each day in a relaxed setting - Limit salt to <2000 mg/day - Follow DASH eating plan (heart healthy diet) - limit alcohol to 2 standard drinks per day for men and 1 per day for women - avoid tobacco products - get at least 2 hours of regular aerobic exercise weekly Patient aware of signs/symptoms requiring further/urgent evaluation. CMP updated today.    Return in about 2 weeks (around 04/11/2023) for BP check with nurse.     Clayborne Dana, NP

## 2023-04-01 LAB — QUANTIFERON-TB GOLD PLUS
Mitogen-NIL: 7.68 [IU]/mL
NIL: 0.06 [IU]/mL
QuantiFERON-TB Gold Plus: NEGATIVE
TB1-NIL: 0.01 [IU]/mL
TB2-NIL: 0.02 [IU]/mL

## 2023-04-01 NOTE — Therapy (Signed)
OUTPATIENT PHYSICAL THERAPY THORACOLUMBAR TREATMENT   Patient Name: Lauren Soto MRN: [id number] DOB:1970/08/10, 52 y.o., female Today's Date: 04/02/2023  END OF SESSION:  PT End of Session - 04/02/23 1157     Visit Number 6    Date for PT Re-Evaluation 04/16/23    Authorization Type [employer] Medicaid    PT Start Time 1155    PT Stop Time 1230    PT Time Calculation (min) 35 min    Activity Tolerance Patient tolerated treatment well    Behavior During Therapy [facility] for tasks assessed/performed                Past Medical History:  Diagnosis Date   Acute cystitis 02/03/2023   AKI (acute kidney injury) (HCC) 02/02/2023   Asthma 04/21/2014   Chronic right hip pain 11/06/2022   Diabetes mellitus without complication (HCC)    Essential hypertension 05/25/2012   GERD (gastroesophageal reflux disease) 02/03/2023   Hyperlipidemia 11/06/2022   Hypertension    Long term current use of oral hypoglycemic drug 11/06/2022   Menorrhagia 06/08/2012   Non-insulin dependent type 2 diabetes mellitus (HCC) 06/18/2018   Pelvic pain in female 06/08/2012   Postural dizziness with presyncope 02/03/2023   Severe sepsis (HCC) 02/03/2023   Uterine fibroid 05/25/2012   Vertigo    Vestibular migraine 07/06/2018   Past Surgical History:  Procedure Laterality Date   ABDOMINAL HYSTERECTOMY     BUNIONECTOMY     CHOLECYSTECTOMY     HERNIA REPAIR     umblical   SHOULDER ARTHROSCOPY     Patient Active Problem List   Diagnosis Date Noted   Obesity (BMI 35.0-39.9 without comorbidity) 03/19/2023   Diabetes mellitus without complication (HCC)    Hypertension    Vertigo    Severe sepsis (HCC) 02/03/2023   Postural dizziness with presyncope 02/03/2023   GERD (gastroesophageal reflux disease) 02/03/2023   Acute cystitis 02/03/2023   AKI (acute kidney injury) (HCC) 02/02/2023   Long term current use of oral hypoglycemic drug 11/06/2022   Chronic right hip pain 11/06/2022    Hyperlipidemia 11/06/2022   Vestibular migraine 07/06/2018   Non-insulin dependent type 2 diabetes mellitus (HCC) 06/18/2018   Asthma 04/21/2014   Menorrhagia 06/08/2012   Pelvic pain in female 06/08/2012   Essential hypertension 05/25/2012   Uterine fibroid 05/25/2012    PCP: Clayborne Dana, NP   REFERRING PROVIDER: Cammy Copa, MD   REFERRING DIAG: M54.16 (ICD-10-CM) - Lumbar radiculopathy   Rationale for Evaluation and Treatment: Rehabilitation  THERAPY DIAG:  Radiculopathy, lumbar region  Cramp and spasm  Muscle weakness (generalized)  Pain in right hip  ONSET DATE: chronic low back pain years, Leg pain started November 2023.   SUBJECTIVE:  SUBJECTIVE STATEMENT: No improvement in pain. Feels like a burning in R groin.   Eval: Pain has been ongoing for about a year.  Pain is in back, right hip and leg radiating to down R calf, mild pain into groin as well. Had an epidural steroid injection with Dr. Alvester Morin on 01/21/2023, which helped 1-2 days.  Also was given a cortisone injection in her hip on 11/29/22.  It didn't help either.  Pain seems to be spreading and she's not sure if its coming from her back or hip.  Chronic back pain has been going on for years, has been checked for RA and that was ruled out.  The leg pain started in November.   PERTINENT HISTORY:  T2DM, HTN, vertigo & migraines, chronic LBP, history of abdominal hysterectomy and umbilical hernia repair.   PAIN:  Are you having pain? Yes: NPRS scale: 8/10 Pain location: midline low back, radiates up, and down Right leg into groin, aldo buttock down to knee, sometimes to calf.  Pain description: throbbing, achey, sharp in groin Aggravating factors: pain increases past 10/10, increased activity, moving a lot or prolonged  sitting Relieving factors: sometimes a heating pad  PRECAUTIONS: None  RED FLAGS: None   WEIGHT BEARING RESTRICTIONS: No  FALLS:  Has patient fallen in last 6 months? No however R knee occasionally buckles/feels weak.   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 Subtle anterolisthesis at L4-L5 with at least moderate facet arthropathy. No spinal stenosis but mild lateral recess and left foraminal stenosis at that level. 2. L5-S1 disc degeneration with a small rightward disc herniation most affecting the right lateral recess. Query right S1 radiculitis. 3. Partially visible lower thoracic disc degeneration with up to mild lower thoracic spinal stenosis. Visible lower spinal cord at those levels seems to remain normal.  11/24/22 MR Pelvis IMPRESSION: Moderate tendinosis with partial tearing of the proximal hamstring tendons bilaterally.   Mild tendinosis of the distal gluteus minimus tendons bilaterally with associated peritrochanteric edema, left greater than right.   Mild osteoarthritis of the hips.  PATIENT SURVEYS:  Modified Oswestry 27/50= 54% severe   COGNITION: Overall cognitive status: Within functional limits for tasks assessed     SENSATION: Not tested  MUSCLE LENGTH: Hamstrings: severe tightness bil  Quadriceps: severe tightness bil   POSTURE:  sits with weight shift to left  PALPATION: Tenderness with PA mob to L3-5, sacrum, bil SIJ tenderness throughout lumbar paraspinals, QL R gluteals, piriformis and greater trochanter,   LUMBAR ROM:   AROM eval  Flexion To knees, p!  Extension Limited 90!, p!  Right lateral flexion To mid thigh  P!  Left lateral flexion To mid thigh p!  Right rotation Limited 50%  Left rotation Limited 50%   (Blank rows = not tested)  LOWER EXTREMITY ROM:   good hip IR/ER in R hip but painful, decreased hip IR on L compared to R   LOWER EXTREMITY MMT:    MMT Right eval Left eval  Hip flexion 3+ 4+  Hip extension 4- 4-  Hip abduction 4+ 5  Hip adduction 4+ 4+  Knee flexion 4 5  Knee extension 4+ 5  Ankle dorsiflexion 5 5  Ankle  plantarflexion 5 5   (Blank rows = not tested)  LUMBAR SPECIAL TESTS:  Straight leg raise test: positive on R and cross straight leg, Slump test: Positive, and FABER test: Positive  FUNCTIONAL TESTS:   GAIT: Distance walked: 42' Assistive device utilized: None Level of assistance: Complete Independence Comments: visually slow, no significant deviation but does report intermittent R knee buckling.   TODAY'S TREATMENT:                                                                                                                              DATE:   04/02/23  Therapeutic Exercise: to improve strength and mobility.  Demo, verbal and tactile cues throughout for technique.  Nustep L3 x 6 min while status assessed  Side glides x 10 B - right hip to wall was preference today as she could feel it in her right LB Lumbar extensions at wall x 10 Prone on elbows abolishes pain Prone press ups x 10 Prone hip extension x 10 B Quadriped with alt leg ext x 5, then bird dog x 5 Plank on knees x 5 10 sec hold Standard plank x 1 Hooklying sequential march x 5 B 90/90 heel taps x 1 ea (hard)   03/24/23  Therapeutic Exercise: to improve strength and mobility.  Demo, verbal and tactile cues throughout for technique.  Nustep L5 x 6 min while status assessed  Side glides - left hip to wall was preference Lumbar extensions on counter Manual Therapy: to decrease muscle spasm and pain and improve mobility STM/TPR to bil QL, R piriformis and glutes, R UPA mobs lumbar spine  grade 2-3, IASTM with foam roller to bil lumbar paraspinals, glutes, and proximal hamstrings.   Modalities: IFC to lumbar paraspinals + MHP in prone x 15 min   03/19/23 Nustep L5 x 6 status assessed. Hooklying diaphragmatic breathing with 2:1 inhale to exhale ratio - working to release tight right diaphragm and decrease rib pain.   Manual Therapy: to decrease muscle spasm and pain and improve mobility. TPR to R QL and R diaphragm. Skilled palpation and monitoring of soft tissues during DN. STM to R gluteals post DN. Trigger Point Dry-Needling  Treatment instructions: Expect mild to moderate muscle soreness. S/S of pneumothorax if dry needled over a lung field, and to seek immediate medical attention should they occur. Patient verbalized understanding of these instructions and education. Patient Consent Given: Yes Education handout provided: Yes Muscles treated: R glue min/med and piriformis, R QL Electrical stimulation performed: No Parameters: N/A Treatment response/outcome: Twitch Response Elicited and Palpable Increase in Muscle Length     03/17/23  Self Care:; Trialing different canes for upcoming trip- single point, quad cane, and walking poles, pros and cons for each, cues for posture and cane placement.  Also education on TENS. Manual Therapy: to decrease muscle spasm and pain and improve mobility STM/TPR to low back, UPA mobs to lumbar spine, IASTM with foam roller to  bil glutes, QL, erector spinea and proximal hamstrings.  Modalities: Estim (IFC) to low back with MHP x 10 min in prone   PATIENT EDUCATION:  Education details: continue HEP especially lumbar extensions and side glides  Person educated: Patient Education method: Explanation Education comprehension: verbalized understanding and returned demonstration  HOME EXERCISE PROGRAM: Access Code: [email or web address]  URL: [email or web address] Date: 03/12/2023 Prepared by: Raynelle Fanning  Exercises - Supine Posterior Pelvic  Tilt  - 1-2 x daily - 7 x weekly - 1 sets - 10 reps - Supine Bridge  - 2 x daily - 7 x weekly - 1-3 sets - 10 reps - Right Standing Lateral Shift Correction at Wall - Hold  - 2 x daily - 7 x weekly - 1 sets - 10 reps - 5-10 sec hold - Lying Prone with 1 Pillow  - 1 x daily - 3-4 x weekly - 1 sets - 10 reps   Pelvic Press     Place hands under belly between navel and pubic bone, palms up. Feel pressure on hands. Increase pressure on hands by pressing pelvis down. This is NOT a pelvic tilt. Hold __5_ seconds. Relax. Repeat _10__ times. Once a day.  KNEE: Flexion - Prone   Hold pelvic press. Bend knee, then return the foot down. Repeat on opposite leg. Do not raise hips. _5-10__ reps per set. When this is mastered, pull both heels up at same time, x 10 reps.  Once a day   Leg Lift: One-Leg   Press pelvis down. Keep knee straight; lengthen and lift one leg (from waist). Do not twist body. Keep other leg down. Hold _2__ seconds. Relax. Repeat 10 time. Repeat with other leg.    ASSESSMENT:  CLINICAL IMPRESSION: Briannia arrived 10 min late today and reports no change in her pain. She is able to reduce her radicular pain with standing extension and is able to abolish her pain with prone on elbows.  She gets increased pain with prone hip extension so we changed to quadriped which she was able to tolerate. Progressed core strength and HEP today.     OBJECTIVE IMPAIRMENTS: decreased activity tolerance, decreased endurance, decreased mobility, difficulty walking, decreased ROM, decreased strength, increased fascial restrictions, impaired perceived functional ability, increased muscle spasms, impaired flexibility, postural dysfunction, and pain.   ACTIVITY LIMITATIONS: carrying, lifting, bending, sitting, standing, squatting, sleeping, transfers, bed mobility, and locomotion level  PARTICIPATION LIMITATIONS: meal prep, cleaning, laundry, shopping, community activity, and occupation  PERSONAL  FACTORS: Time since onset of injury/illness/exacerbation and 3+ comorbidities: T2DM, HTN, vertigo & migraines, chronic LBP, history of abdominal hysterectomy and umbilical hernia repair.  are also affecting patient's functional outcome.   REHAB POTENTIAL: Good  CLINICAL DECISION MAKING: Evolving/moderate complexity  EVALUATION COMPLEXITY: Moderate   GOALS: Goals reviewed with patient? Yes  SHORT TERM GOALS: Target date: 03/19/2023   Patient will be independent with initial HEP.  Baseline:  Goal status: MET 03/17/23   LONG TERM GOALS: Target date: 04/16/2023   Patient will be independent with advanced/ongoing HEP to improve outcomes and carryover.  Baseline:  Goal status: IN PROGRESS  2.  Patient will report 75% improvement in low back pain to improve QOL.  Baseline:  Goal status: IN PROGRESS  3.  Patient will demonstrate full pain free lumbar ROM to perform ADLs.   Baseline: see objective Goal status: IN PROGRESS  4.  Patient will demonstrate improved functional strength as demonstrated by 4+/5 R hip strength. Baseline: see objective Goal status: IN PROGRESS  5.  Patient will report at least 6 points improvement on modified Oswestry to demonstrate improved functional ability.  Baseline: 27/50 Goal status: IN PROGRESS   6.  Patient will report being able to sleep without interruption from LBP to improve QOL. Baseline: reports sleeping only 1/4 normal amount due to pain Goal status: IN PROGRESS  7.   Patient will report centralization of radicular symptoms.  Baseline: radiates down RLE to calf and groin Goal status: IN PROGRESS  PLAN:  PT FREQUENCY: 1-2x/week  PT DURATION: 6 weeks  PLANNED INTERVENTIONS: Therapeutic exercises, Therapeutic activity, Neuromuscular re-education, Balance training, Gait training, Patient/Family education, Self Care, Joint mobilization, Joint manipulation, Orthotic/Fit training, Dry Needling, Electrical stimulation, Spinal manipulation,  Spinal mobilization, Cryotherapy, Moist heat, Taping, Traction, Ultrasound, Manual therapy, and Re-evaluation.  PLAN FOR NEXT SESSION: Continue to progress therex - tolerating mckenzie well, needs more core strengthening, manual and modalities PRN, did not respond well to TrDN.    Loyed Wilmes, PT 04/02/2023, 12:57 PM

## 2023-04-02 ENCOUNTER — Ambulatory Visit: Payer: Medicaid Other | Attending: Orthopedic Surgery | Admitting: Physical Therapy

## 2023-04-02 ENCOUNTER — Encounter: Payer: Self-pay | Admitting: Physical Therapy

## 2023-04-02 DIAGNOSIS — M5416 Radiculopathy, lumbar region: Secondary | ICD-10-CM | POA: Insufficient documentation

## 2023-04-02 DIAGNOSIS — M25551 Pain in right hip: Secondary | ICD-10-CM | POA: Diagnosis present

## 2023-04-02 DIAGNOSIS — M6281 Muscle weakness (generalized): Secondary | ICD-10-CM | POA: Diagnosis present

## 2023-04-02 DIAGNOSIS — R252 Cramp and spasm: Secondary | ICD-10-CM | POA: Diagnosis present

## 2023-04-04 ENCOUNTER — Ambulatory Visit (HOSPITAL_COMMUNITY): Payer: Medicaid Other

## 2023-04-04 ENCOUNTER — Ambulatory Visit: Payer: Medicaid Other | Admitting: Podiatry

## 2023-04-04 DIAGNOSIS — E1142 Type 2 diabetes mellitus with diabetic polyneuropathy: Secondary | ICD-10-CM

## 2023-04-04 DIAGNOSIS — L84 Corns and callosities: Secondary | ICD-10-CM | POA: Diagnosis not present

## 2023-04-04 MED ORDER — UREA 10 % EX CREA: TOPICAL_CREAM | CUTANEOUS | 0 refills | Status: AC | PRN

## 2023-04-04 NOTE — Progress Notes (Signed)
Subjective:  Patient ID: Lauren Soto, female    DOB: 08-31-1970,  MRN: [id number]  Chief Complaint  Patient presents with   Callouses    Possible corns to bilateral feet. 2 on the right foot and one of the left foot. Diabetic. Latest A1C 7.2.     52 y.o. female presents with the above complaint. History confirmed with patient. Patient presenting with pain related to corns present on both feet.  2 in the right and 1 on the left patient does have a history of T2DM.   Objective:  Physical Exam: warm, good capillary refill nail exam normal nails without lesions DP pulses palpable, PT pulses palpable, and protective sensation intact  Subjective sensation of some numbness in the toes occasionally Left Foot:  Pain with palpation of callus lesion subfifth metatarsal base Right Foot: Pain with palpation of callus lesion subfifth metatarsal base and plantar medial midfoot  Assessment:   1. Corns and callosities   2. DM type 2 with diabetic peripheral neuropathy (HCC)      Plan:  Patient was evaluated and treated and all questions answered.  #Hyperkeratotic lesions/pre ulcerative calluses present bilateral plantar midfoot All symptomatic hyperkeratoses x 3 separate lesions were safely debrided with a sterile #10 blade to patient's level of comfort without incident. We discussed preventative and palliative care of these lesions including supportive and accommodative shoegear, padding, prefabricated and custom molded accommodative orthoses, use of a pumice stone and lotions/creams daily.   Return in about 3 months (around 07/05/2023) for [facility].         Corinna Gab, DPM [facility] / [facility]

## 2023-04-08 ENCOUNTER — Encounter: Payer: Self-pay | Admitting: Family Medicine

## 2023-04-08 ENCOUNTER — Telehealth: Payer: Self-pay | Admitting: Neurology

## 2023-04-08 ENCOUNTER — Encounter: Payer: Self-pay | Admitting: Physical Therapy

## 2023-04-08 ENCOUNTER — Ambulatory Visit: Payer: Medicaid Other | Admitting: Physical Therapy

## 2023-04-08 ENCOUNTER — Ambulatory Visit (INDEPENDENT_AMBULATORY_CARE_PROVIDER_SITE_OTHER): Payer: Medicaid Other | Admitting: Family Medicine

## 2023-04-08 VITALS — BP 160/95 | HR 71 | Ht 60.0 in | Wt 195.4 lb

## 2023-04-08 DIAGNOSIS — N649 Disorder of breast, unspecified: Secondary | ICD-10-CM | POA: Diagnosis not present

## 2023-04-08 DIAGNOSIS — M5416 Radiculopathy, lumbar region: Secondary | ICD-10-CM

## 2023-04-08 DIAGNOSIS — I10 Essential (primary) hypertension: Secondary | ICD-10-CM | POA: Diagnosis not present

## 2023-04-08 DIAGNOSIS — Z23 Encounter for immunization: Secondary | ICD-10-CM | POA: Diagnosis not present

## 2023-04-08 DIAGNOSIS — G5793 Unspecified mononeuropathy of bilateral lower limbs: Secondary | ICD-10-CM

## 2023-04-08 DIAGNOSIS — R252 Cramp and spasm: Secondary | ICD-10-CM

## 2023-04-08 DIAGNOSIS — M25551 Pain in right hip: Secondary | ICD-10-CM

## 2023-04-08 DIAGNOSIS — M6281 Muscle weakness (generalized): Secondary | ICD-10-CM

## 2023-04-08 MED ORDER — DOXYCYCLINE HYCLATE 100 MG PO TABS
100.0000 mg | ORAL_TABLET | Freq: Two times a day (BID) | ORAL | 0 refills | Status: AC
Start: 2023-04-08 — End: 2023-04-13

## 2023-04-08 MED ORDER — HYDROCHLOROTHIAZIDE 12.5 MG PO TABS
12.5000 mg | ORAL_TABLET | Freq: Every day | ORAL | 3 refills | Status: DC
Start: 2023-04-08 — End: 2023-06-04

## 2023-04-08 NOTE — Progress Notes (Signed)
Acute Office Visit  Subjective:     Patient ID: Lauren Soto, female    DOB: 03-27-1971, 52 y.o.   MRN: [id number]  Chief Complaint  Patient presents with   Breast Pain    Mole in center of chest/breast area. Soreness, redness. Maybe about a week. Mole has been there for years.      Patient is in today for breast concern  Discussed the use of AI scribe software for clinical note transcription with the patient, who gave verbal consent to proceed.  History of Present Illness   The patient presents with a concern about a long-standing mole on her breast that started changing approximately 2-3 weeks ago. The patient first noticed a pain under her right breast about 3-4 weeks ago, which has since improved but is still present. The patient sleeps on her stomach and noticed discomfort in the area of the mole, which on inspection was found to be red and inflamed. The patient describes the area around the mole as feeling 'lumpy' or 'knotty', and the mole itself, which was previously black, now has a whitish area around it. The area is tender, particularly at night, and especially when the patient lays on it. There is no reported drainage from the area. The patient also reports a persistent soreness under the right breast and a history of an unusual sensation in the area.  The patient also reports a recent increase in blood pressure, which is being monitored. The patient has been experiencing tingling sensations and pain in her feet, which are not limited to the areas previously identified and treated by a podiatrist. The patient is currently on pregabalin for these symptoms.  The patient's last mammogram was approximately a year ago, and she has received a reminder to schedule another one. The patient also reports that she is due for a stress test for her cardiac issues.              ROS All review of systems negative except what is listed in the HPI      Objective:    BP  (!) 160/95   Pulse 71   Ht 5' (1.524 m)   Wt 195 lb 6 oz (88.6 kg)   LMP 06/04/2012   SpO2 99%   BMI 38.16 kg/m    Physical Exam Vitals reviewed. Exam conducted with a chaperone present.  Constitutional:      Appearance: Normal appearance.  Chest:  Breasts:    Right: No swelling, inverted nipple, nipple discharge or tenderness.     Left: No swelling, inverted nipple, nipple discharge or tenderness.    Skin:    General: Skin is warm and dry.     Capillary Refill: Capillary refill takes less than 2 seconds.  Neurological:     Mental Status: She is alert and oriented to person, place, and time.  Psychiatric:        Mood and Affect: Mood normal.        Behavior: Behavior normal.        Thought Content: Thought content normal.        Judgment: Judgment normal.     No results found for any visits on 04/08/23.      Assessment & Plan:   Problem List Items Addressed This Visit       Active Problems   Essential hypertension -Restart Hydrochlorothiazide (HCTZ). -Advise patient to keep a log of blood pressure readings. -2 week follow-up   Relevant Medications   hydrochlorothiazide (  HYDRODIURIL) 12.5 MG tablet   Other Visit Diagnoses     Need for influenza vaccination    -  Primary    Breast lesion     New onset of redness, tenderness, and changes in a long-standing mole on the right breast over the past 2-3 weeks. No drainage or systemic symptoms. -Order diagnostic bilateral mammogram. -Start Doxycycline for 5 days in case of underlying infection. -Advise warm compresses.    Relevant Medications   doxycycline (VIBRA-TABS) 100 MG tablet   Other Relevant Orders   MM 3D DIAGNOSTIC MAMMOGRAM BILATERAL BREAST   Neuropathic pain of both feet     Reports of tingling in the hand and foot pain. -Refer to neurology for nerve conduction studies.    Relevant Orders   Ambulatory referral to Neurology       Meds ordered this encounter  Medications    hydrochlorothiazide (HYDRODIURIL) 12.5 MG tablet    Sig: Take 1 tablet (12.5 mg total) by mouth daily.    Dispense:  90 tablet    Refill:  3    Order Specific Question:   Supervising Provider    Answer:   Danise Edge A [[id number]]   doxycycline (VIBRA-TABS) 100 MG tablet    Sig: Take 1 tablet (100 mg total) by mouth 2 (two) times daily for 5 days.    Dispense:  10 tablet    Refill:  0    Order Specific Question:   Supervising Provider    Answer:   Danise Edge A [[id number]]    Return in about 2 weeks (around 04/22/2023) for HTN follow-up.  Clayborne Dana, NP

## 2023-04-08 NOTE — Addendum Note (Signed)
Addended by: Donne Anon L on: 04/08/2023 12:56 PM   Modules accepted: Orders

## 2023-04-08 NOTE — Telephone Encounter (Signed)
Pt called in stating she is going on a cruise and would like to see if Dr. Everlena Cooper could send a prescription patch for her to put behind her ear. Her pharmacy is [facility] [address].   Her headache injection that he prescribed was not approved by her insurance. She is wondering if there is something else?

## 2023-04-08 NOTE — Telephone Encounter (Signed)
Advised patient to call PCP for the Prescription patch for nausea.

## 2023-04-08 NOTE — Therapy (Signed)
OUTPATIENT PHYSICAL THERAPY THORACOLUMBAR TREATMENT   Patient Name: Lauren Soto MRN: [id number] DOB:March 11, 1971, 52 y.o., female Today's Date: 04/08/2023  END OF SESSION:  PT End of Session - 04/08/23 1143     Visit Number 7    Date for PT Re-Evaluation 04/16/23    Authorization Type [employer] Medicaid    PT Start Time 1145    PT Stop Time 1233    PT Time Calculation (min) 48 min    Activity Tolerance Patient tolerated treatment well    Behavior During Therapy [facility] for tasks assessed/performed                Past Medical History:  Diagnosis Date   Acute cystitis 02/03/2023   AKI (acute kidney injury) (HCC) 02/02/2023   Asthma 04/21/2014   Chronic right hip pain 11/06/2022   Diabetes mellitus without complication (HCC)    Essential hypertension 05/25/2012   GERD (gastroesophageal reflux disease) 02/03/2023   Hyperlipidemia 11/06/2022   Hypertension    Long term current use of oral hypoglycemic drug 11/06/2022   Menorrhagia 06/08/2012   Non-insulin dependent type 2 diabetes mellitus (HCC) 06/18/2018   Pelvic pain in female 06/08/2012   Postural dizziness with presyncope 02/03/2023   Severe sepsis (HCC) 02/03/2023   Uterine fibroid 05/25/2012   Vertigo    Vestibular migraine 07/06/2018   Past Surgical History:  Procedure Laterality Date   ABDOMINAL HYSTERECTOMY     BUNIONECTOMY     CHOLECYSTECTOMY     HERNIA REPAIR     umblical   SHOULDER ARTHROSCOPY     Patient Active Problem List   Diagnosis Date Noted   Obesity (BMI 35.0-39.9 without comorbidity) 03/19/2023   Diabetes mellitus without complication (HCC)    Hypertension    Vertigo    Severe sepsis (HCC) 02/03/2023   Postural dizziness with presyncope 02/03/2023   GERD (gastroesophageal reflux disease) 02/03/2023   Acute cystitis 02/03/2023   AKI (acute kidney injury) (HCC) 02/02/2023   Long term current use of oral hypoglycemic drug 11/06/2022   Chronic right hip pain 11/06/2022    Hyperlipidemia 11/06/2022   Vestibular migraine 07/06/2018   Non-insulin dependent type 2 diabetes mellitus (HCC) 06/18/2018   Asthma 04/21/2014   Menorrhagia 06/08/2012   Pelvic pain in female 06/08/2012   Essential hypertension 05/25/2012   Uterine fibroid 05/25/2012    PCP: Clayborne Dana, NP   REFERRING PROVIDER: Cammy Copa, MD   REFERRING DIAG: M54.16 (ICD-10-CM) - Lumbar radiculopathy   Rationale for Evaluation and Treatment: Rehabilitation  THERAPY DIAG:  Radiculopathy, lumbar region  Cramp and spasm  Muscle weakness (generalized)  Pain in right hip  ONSET DATE: chronic low back pain years, Leg pain started November 2023.   SUBJECTIVE:  SUBJECTIVE STATEMENT: Pain is mostly now in R buttock and back, not as bad.   Eval: Pain has been ongoing for about a year.  Pain is in back, right hip and leg radiating to down R calf, mild pain into groin as well. Had an epidural steroid injection with Dr. Alvester Morin on 01/21/2023, which helped 1-2 days.  Also was given a cortisone injection in her hip on 11/29/22.  It didn't help either.  Pain seems to be spreading and she's not sure if its coming from her back or hip.  Chronic back pain has been going on for years, has been checked for RA and that was ruled out.  The leg pain started in November.   PERTINENT HISTORY:  T2DM, HTN, vertigo & migraines, chronic LBP, history of abdominal hysterectomy and umbilical hernia repair.   PAIN:  Are you having pain? Yes: NPRS scale: 5/10 Pain location: midline low back, radiates up, and down Right leg into groin, aldo buttock down to knee, sometimes to calf.  Pain description: throbbing, achey, sharp in groin Aggravating factors: pain increases past 10/10, increased activity, moving a lot or prolonged  sitting Relieving factors: sometimes a heating pad  PRECAUTIONS: None  RED FLAGS: None   WEIGHT BEARING RESTRICTIONS: No  FALLS:  Has patient fallen in last 6 months? No however R knee occasionally buckles/feels weak.   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 Subtle anterolisthesis at L4-L5 with at least moderate facet arthropathy. No spinal stenosis but mild lateral recess and left foraminal stenosis at that level. 2. L5-S1 disc degeneration with a small rightward disc herniation most affecting the right lateral recess. Query right S1 radiculitis. 3. Partially visible lower thoracic disc degeneration with up to mild lower thoracic spinal stenosis. Visible lower spinal cord at those levels seems to remain normal.  11/24/22 MR Pelvis IMPRESSION: Moderate tendinosis with partial tearing of the proximal hamstring tendons bilaterally.   Mild tendinosis of the distal gluteus minimus tendons bilaterally with associated peritrochanteric edema, left greater than right.   Mild osteoarthritis of the hips.  PATIENT SURVEYS:  Modified Oswestry 27/50= 54% severe   COGNITION: Overall cognitive status: Within functional limits for tasks assessed     SENSATION: Not tested  MUSCLE LENGTH: Hamstrings: severe tightness bil  Quadriceps: severe tightness bil   POSTURE:  sits with weight shift to left  PALPATION: Tenderness with PA mob to L3-5, sacrum, bil SIJ tenderness throughout lumbar paraspinals, QL R gluteals, piriformis and greater trochanter,   LUMBAR ROM:   AROM eval  Flexion To knees, p!  Extension Limited 90!, p!  Right lateral flexion To mid thigh  P!  Left lateral flexion To mid thigh p!  Right rotation Limited 50%  Left rotation Limited 50%   (Blank rows = not tested)  LOWER EXTREMITY ROM:   good hip IR/ER in R hip but painful, decreased hip IR on L compared to R   LOWER EXTREMITY MMT:    MMT Right eval Left eval  Hip flexion 3+ 4+  Hip extension 4- 4-  Hip abduction 4+ 5  Hip adduction 4+ 4+  Knee flexion 4 5  Knee extension 4+ 5  Ankle dorsiflexion 5 5  Ankle plantarflexion 5 5   (Blank rows = not tested)  LUMBAR SPECIAL TESTS:  Straight leg raise test: positive on R and cross straight leg, Slump test: Positive, and FABER test: Positive  FUNCTIONAL TESTS:   GAIT: Distance walked: 65' Assistive device utilized: None Level of assistance: Complete Independence Comments: visually slow, no significant deviation but does report intermittent R knee buckling.   TODAY'S TREATMENT:                                                                                                                              DATE:   04/08/23 Therapeutic Exercise: to improve strength and mobility.  Demo, verbal and tactile cues throughout for technique. Standing side glides x 10  Standing lumbar extensions x 10 Supine PPT x 10  Bridges with TrA contraction 2 x 5  Marching with TrA contraction x 10  Bent knee fall outs with TrA contraction x 10  Manual Therapy: to decrease muscle spasm and pain and improve mobility R UPA mobs to lumbar spine, IASTM with foam roller to lumbar paraspinals, glutes and QL.   04/02/23  Therapeutic Exercise: to improve strength and mobility.  Demo, verbal and tactile cues throughout for technique.  Nustep L3 x 6 min while status assessed  Side glides x 10 B - right hip to wall was preference today as she could feel it in her right LB Lumbar extensions at wall x 10 Prone on elbows abolishes pain Prone press ups x 10 Prone hip extension x 10 B Quadriped with alt leg ext x 5, then bird dog x 5 Plank on knees x  5 10 sec hold Standard plank x 1 Hooklying sequential march x 5 B 90/90 heel taps x 1 ea (hard)  03/24/23  Therapeutic Exercise: to improve strength and mobility.  Demo, verbal and tactile cues throughout for technique.  Nustep L5 x 6 min while status assessed  Side glides - left hip to wall was preference Lumbar extensions on counter Manual Therapy: to decrease muscle spasm and pain and improve mobility STM/TPR to bil QL, R piriformis and glutes, R UPA mobs lumbar spine grade 2-3, IASTM with foam roller to bil lumbar paraspinals, glutes, and proximal hamstrings.   Modalities: IFC to lumbar paraspinals + MHP in prone x 15 min   03/19/23 Nustep L5 x 6 status assessed. Hooklying diaphragmatic breathing with 2:1 inhale to exhale ratio - working to release tight right diaphragm and decrease rib pain.   Manual Therapy: to decrease muscle spasm and pain and improve mobility. TPR to R QL and R diaphragm. Skilled palpation and monitoring of soft tissues during DN. STM to R gluteals post DN. Trigger Point Dry-Needling  Treatment instructions: Expect mild to moderate muscle soreness. S/S of pneumothorax if dry needled over a lung field, and to seek immediate medical attention should they occur. Patient verbalized understanding of these instructions and education. Patient Consent Given: Yes Education handout provided: Yes Muscles  treated: R glue min/med and piriformis, R QL Electrical stimulation performed: No Parameters: N/A Treatment response/outcome: Twitch Response Elicited and Palpable Increase in Muscle Length   PATIENT EDUCATION:  Education details: continue HEP especially lumbar extensions and side glides  Person educated: Patient Education method: Explanation Education comprehension: verbalized understanding and returned demonstration  HOME EXERCISE PROGRAM: Access Code: [email or web address]  URL: [email or web address] Date: 03/12/2023 Prepared by: Raynelle Fanning  Exercises - Supine  Posterior Pelvic Tilt  - 1-2 x daily - 7 x weekly - 1 sets - 10 reps - Supine Bridge  - 2 x daily - 7 x weekly - 1-3 sets - 10 reps - Right Standing Lateral Shift Correction at Wall - Hold  - 2 x daily - 7 x weekly - 1 sets - 10 reps - 5-10 sec hold - Lying Prone with 1 Pillow  - 1 x daily - 3-4 x weekly - 1 sets - 10 reps   Pelvic Press     Place hands under belly between navel and pubic bone, palms up. Feel pressure on hands. Increase pressure on hands by pressing pelvis down. This is NOT a pelvic tilt. Hold __5_ seconds. Relax. Repeat _10__ times. Once a day.  KNEE: Flexion - Prone   Hold pelvic press. Bend knee, then return the foot down. Repeat on opposite leg. Do not raise hips. _5-10__ reps per set. When this is mastered, pull both heels up at same time, x 10 reps.  Once a day   Leg Lift: One-Leg   Press pelvis down. Keep knee straight; lengthen and lift one leg (from waist). Do not twist body. Keep other leg down. Hold _2__ seconds. Relax. Repeat 10 time. Repeat with other leg.    ASSESSMENT:  CLINICAL IMPRESSION: Ladoris reports continued improvement in low back and and centralization of symptoms.  Discussed propping 1 foot up to help decrease tension on low back with standing.  Also gave VOR x 1 exercises to help with vertigo symptoms after noting difficulty changing positions from supine to prone today.  Tolerated bridges today well, encouraged to focus on this with her exercises as well.  Guilda Petitt Steed-Matthews continues to demonstrate potential for improvement and would benefit from continued skilled therapy to address impairments.       OBJECTIVE IMPAIRMENTS: decreased activity tolerance, decreased endurance, decreased mobility, difficulty walking, decreased ROM, decreased strength, increased fascial restrictions, impaired perceived functional ability, increased muscle spasms, impaired flexibility, postural dysfunction, and pain.   ACTIVITY LIMITATIONS: carrying,  lifting, bending, sitting, standing, squatting, sleeping, transfers, bed mobility, and locomotion level  PARTICIPATION LIMITATIONS: meal prep, cleaning, laundry, shopping, community activity, and occupation  PERSONAL FACTORS: Time since onset of injury/illness/exacerbation and 3+ comorbidities: T2DM, HTN, vertigo & migraines, chronic LBP, history of abdominal hysterectomy and umbilical hernia repair.  are also affecting patient's functional outcome.   REHAB POTENTIAL: Good  CLINICAL DECISION MAKING: Evolving/moderate complexity  EVALUATION COMPLEXITY: Moderate   GOALS: Goals reviewed with patient? Yes  SHORT TERM GOALS: Target date: 03/19/2023   Patient will be independent with initial HEP.  Baseline:  Goal status: MET 03/17/23   LONG TERM GOALS: Target date: 04/16/2023   Patient will be independent with advanced/ongoing HEP to improve outcomes and carryover.  Baseline:  Goal status: IN PROGRESS  2.  Patient will report 75% improvement in low back pain to improve QOL.  Baseline:  Goal status: IN PROGRESS  3.  Patient will demonstrate full pain free lumbar ROM to perform ADLs.   Baseline: see objective Goal  status: IN PROGRESS  4.  Patient will demonstrate improved functional strength as demonstrated by 4+/5 R hip strength. Baseline: see objective Goal status: IN PROGRESS  5.  Patient will report at least 6 points improvement on modified Oswestry to demonstrate improved functional ability.  Baseline: 27/50 Goal status: IN PROGRESS   6.  Patient will report being able to sleep without interruption from LBP to improve QOL. Baseline: reports sleeping only 1/4 normal amount due to pain Goal status: IN PROGRESS  7.   Patient will report centralization of radicular symptoms.  Baseline: radiates down RLE to calf and groin Goal status: IN PROGRESS 04/08/23- centralized to R buttock now  PLAN:  PT FREQUENCY: 1-2x/week  PT DURATION: 6 weeks  PLANNED INTERVENTIONS:  Therapeutic exercises, Therapeutic activity, Neuromuscular re-education, Balance training, Gait training, Patient/Family education, Self Care, Joint mobilization, Joint manipulation, Orthotic/Fit training, Dry Needling, Electrical stimulation, Spinal manipulation, Spinal mobilization, Cryotherapy, Moist heat, Taping, Traction, Ultrasound, Manual therapy, and Re-evaluation.  PLAN FOR NEXT SESSION: Continue to progress therex - tolerating mckenzie well, needs more core strengthening, manual and modalities PRN, did not respond well to TrDN.    Jena Gauss, PT 04/08/2023, 12:54 PM

## 2023-04-08 NOTE — Assessment & Plan Note (Signed)
-  Restart Hydrochlorothiazide (HCTZ). -Advise patient to keep a log of blood pressure readings. -2 week follow-up

## 2023-04-10 ENCOUNTER — Telehealth: Payer: Self-pay | Admitting: Family Medicine

## 2023-04-10 ENCOUNTER — Ambulatory Visit (HOSPITAL_COMMUNITY): Payer: Medicaid Other | Attending: Cardiology

## 2023-04-10 ENCOUNTER — Telehealth: Payer: Self-pay | Admitting: Neurology

## 2023-04-10 DIAGNOSIS — Z87898 Personal history of other specified conditions: Secondary | ICD-10-CM

## 2023-04-10 DIAGNOSIS — R0609 Other forms of dyspnea: Secondary | ICD-10-CM | POA: Insufficient documentation

## 2023-04-10 LAB — MYOCARDIAL PERFUSION IMAGING
LV dias vol: 57 mL (ref 46–106)
LV sys vol: 19 mL
Nuc Stress EF: 66 %
Peak HR: 105 {beats}/min
Rest HR: 76 {beats}/min
Rest Nuclear Isotope Dose: 9.8 mCi
SDS: 1
SRS: 0
SSS: 1
Stress Nuclear Isotope Dose: 31.7 mCi
TID: 1.06

## 2023-04-10 MED ORDER — REGADENOSON 0.4 MG/5ML IV SOLN
0.4000 mg | Freq: Once | INTRAVENOUS | Status: AC
  Administered 2023-04-10: 0.4 mg via INTRAVENOUS

## 2023-04-10 MED ORDER — TECHNETIUM TC 99M TETROFOSMIN IV KIT
31.7000 | PACK | Freq: Once | INTRAVENOUS | Status: AC | PRN
  Administered 2023-04-10: 31.7 via INTRAVENOUS

## 2023-04-10 MED ORDER — TECHNETIUM TC 99M TETROFOSMIN IV KIT
9.8000 | PACK | Freq: Once | INTRAVENOUS | Status: AC | PRN
  Administered 2023-04-10: 9.8 via INTRAVENOUS

## 2023-04-10 NOTE — Telephone Encounter (Signed)
Patient called wanting to know if Nurse received papers that were faxed on tuesday

## 2023-04-10 NOTE — Telephone Encounter (Signed)
Pt called and requested for Lauren Soto to send in a patch that goes behind her ear for sea sickness. She mentioned that Lauren Soto is aware of her having vertigo frequently and that this has been discussed during her past appointments. Please advise.

## 2023-04-10 NOTE — Telephone Encounter (Signed)
Received letter from patient's insurance announcing denial to cover Ajovy.   It states that she must try on of the following drugs for at least a month: A) Antidepressant B) Anti-epileptic C) ACE inhibitor/angiotensin II receptor blockers D) Calcium channel blockers.  Patient does meet criteria (as was clearly documented in my note for their review).  She has previously taken: Nortriptyline (antidepressant) Valproate, zonisamide (anti-epileptic Lisinopril (ACE inhibitor)

## 2023-04-11 ENCOUNTER — Ambulatory Visit: Payer: Medicaid Other | Admitting: Physical Therapy

## 2023-04-11 ENCOUNTER — Encounter: Payer: Self-pay | Admitting: Physical Therapy

## 2023-04-11 DIAGNOSIS — M25551 Pain in right hip: Secondary | ICD-10-CM

## 2023-04-11 DIAGNOSIS — M6281 Muscle weakness (generalized): Secondary | ICD-10-CM

## 2023-04-11 DIAGNOSIS — M5416 Radiculopathy, lumbar region: Secondary | ICD-10-CM | POA: Diagnosis not present

## 2023-04-11 DIAGNOSIS — R252 Cramp and spasm: Secondary | ICD-10-CM

## 2023-04-11 MED ORDER — SCOPOLAMINE 1 MG/3DAYS TD PT72
1.0000 | MEDICATED_PATCH | TRANSDERMAL | 1 refills | Status: DC
Start: 2023-04-11 — End: 2023-06-04

## 2023-04-11 NOTE — Telephone Encounter (Signed)
Are you okay with sending this RX?

## 2023-04-11 NOTE — Therapy (Signed)
OUTPATIENT PHYSICAL THERAPY THORACOLUMBAR TREATMENT   Patient Name: Lauren Soto MRN: [id number] DOB:1971/06/12, 52 y.o., female Today's Date: 04/11/2023  END OF SESSION:  PT End of Session - 04/11/23 1023     Visit Number 8    Date for PT Re-Evaluation 04/16/23    Authorization Type [employer]    PT Start Time 1021    PT Stop Time 1105    PT Time Calculation (min) 44 min    Activity Tolerance Patient tolerated treatment well    Behavior During Therapy [facility] for tasks assessed/performed                Past Medical History:  Diagnosis Date   Acute cystitis 02/03/2023   AKI (acute kidney injury) (HCC) 02/02/2023   Asthma 04/21/2014   Chronic right hip pain 11/06/2022   Diabetes mellitus without complication (HCC)    Essential hypertension 05/25/2012   GERD (gastroesophageal reflux disease) 02/03/2023   Hyperlipidemia 11/06/2022   Hypertension    Long term current use of oral hypoglycemic drug 11/06/2022   Menorrhagia 06/08/2012   Non-insulin dependent type 2 diabetes mellitus (HCC) 06/18/2018   Pelvic pain in female 06/08/2012   Postural dizziness with presyncope 02/03/2023   Severe sepsis (HCC) 02/03/2023   Uterine fibroid 05/25/2012   Vertigo    Vestibular migraine 07/06/2018   Past Surgical History:  Procedure Laterality Date   ABDOMINAL HYSTERECTOMY     BUNIONECTOMY     CHOLECYSTECTOMY     HERNIA REPAIR     umblical   SHOULDER ARTHROSCOPY     Patient Active Problem List   Diagnosis Date Noted   Obesity (BMI 35.0-39.9 without comorbidity) 03/19/2023   Diabetes mellitus without complication (HCC)    Hypertension    Vertigo    Severe sepsis (HCC) 02/03/2023   Postural dizziness with presyncope 02/03/2023   GERD (gastroesophageal reflux disease) 02/03/2023   Acute cystitis 02/03/2023   AKI (acute kidney injury) (HCC) 02/02/2023   Long term current use of oral hypoglycemic drug 11/06/2022   Chronic right hip pain 11/06/2022    Hyperlipidemia 11/06/2022   Vestibular migraine 07/06/2018   Non-insulin dependent type 2 diabetes mellitus (HCC) 06/18/2018   Asthma 04/21/2014   Menorrhagia 06/08/2012   Pelvic pain in female 06/08/2012   Essential hypertension 05/25/2012   Uterine fibroid 05/25/2012    PCP: Clayborne Dana, NP   REFERRING PROVIDER: Cammy Copa, MD   REFERRING DIAG: M54.16 (ICD-10-CM) - Lumbar radiculopathy   Rationale for Evaluation and Treatment: Rehabilitation  THERAPY DIAG:  Radiculopathy, lumbar region  Cramp and spasm  Muscle weakness (generalized)  Pain in right hip  ONSET DATE: chronic low back pain years, Leg pain started November 2023.   SUBJECTIVE:  SUBJECTIVE STATEMENT: Had a stress test yesterday but couldn't complete due to blood pressure being too high.  Back is not bad but it hurts, couldn't get comfortable last night in bed.    Eval: Pain has been ongoing for about a year.  Pain is in back, right hip and leg radiating to down R calf, mild pain into groin as well. Had an epidural steroid injection with Dr. Alvester Morin on 01/21/2023, which helped 1-2 days.  Also was given a cortisone injection in her hip on 11/29/22.  It didn't help either.  Pain seems to be spreading and she's not sure if its coming from her back or hip.  Chronic back pain has been going on for years, has been checked for RA and that was ruled out.  The leg pain started in November.   PERTINENT HISTORY:  T2DM, HTN, vertigo & migraines, chronic LBP, history of abdominal hysterectomy and umbilical hernia repair.   PAIN:  Are you having pain? Yes: NPRS scale: 5/10 Pain location: midline low back, radiates up, and down Right leg into groin, aldo buttock down to knee, sometimes to calf.  Pain description: throbbing, achey, sharp  in groin Aggravating factors: pain increases past 10/10, increased activity, moving a lot or prolonged sitting Relieving factors: sometimes a heating pad  PRECAUTIONS: None  RED FLAGS: None   WEIGHT BEARING RESTRICTIONS: No  FALLS:  Has patient fallen in last 6 months? No however R knee occasionally buckles/feels weak.   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 Subtle anterolisthesis at L4-L5 with at least moderate facet arthropathy. No spinal stenosis but mild lateral recess and left foraminal stenosis at that level. 2. L5-S1 disc degeneration with a small rightward disc herniation most affecting the right lateral recess. Query right S1 radiculitis. 3. Partially visible lower thoracic disc degeneration with up to mild lower thoracic spinal stenosis. Visible lower spinal cord at those levels seems to remain normal.  11/24/22 MR Pelvis IMPRESSION: Moderate tendinosis with partial tearing of the proximal hamstring tendons bilaterally.   Mild tendinosis of the distal gluteus minimus tendons bilaterally with associated peritrochanteric edema, left greater than right.   Mild osteoarthritis of the hips.  PATIENT SURVEYS:  Modified Oswestry 27/50= 54% severe   COGNITION: Overall cognitive status: Within functional limits for tasks assessed     SENSATION: Not tested  MUSCLE LENGTH: Hamstrings: severe tightness bil  Quadriceps: severe tightness bil   POSTURE:  sits with weight shift to left  PALPATION: Tenderness with PA mob to L3-5, sacrum, bil SIJ tenderness throughout lumbar paraspinals, QL R gluteals, piriformis and greater trochanter,   LUMBAR ROM:    AROM eval  Flexion To knees, p!  Extension Limited 90!, p!  Right lateral flexion To mid thigh P!  Left lateral flexion To mid thigh p!  Right rotation Limited 50%  Left rotation Limited 50%   (Blank rows = not tested)  LOWER EXTREMITY ROM:   good hip IR/ER in R hip but painful, decreased hip IR on L compared to R   LOWER EXTREMITY MMT:    MMT Right eval Left eval  Hip flexion 3+ 4+  Hip extension 4- 4-  Hip abduction 4+ 5  Hip  adduction 4+ 4+  Knee flexion 4 5  Knee extension 4+ 5  Ankle dorsiflexion 5 5  Ankle plantarflexion 5 5   (Blank rows = not tested)  LUMBAR SPECIAL TESTS:  Straight leg raise test: positive on R and cross straight leg, Slump test: Positive, and FABER test: Positive  FUNCTIONAL TESTS:   GAIT: Distance walked: 36' Assistive device utilized: None Level of assistance: Complete Independence Comments: visually slow, no significant deviation but does report intermittent R knee buckling.   TODAY'S TREATMENT:                                                                                                                              DATE:    04/10/23 Therapeutic Exercise: to improve strength and mobility.  Demo, verbal and tactile cues throughout for technique. Nustep L4 x 6 min  Demo - calf stretches LTR - starting small gradually increasing amplitude SKTC stretch  Piriformis stretches Windshield wipers Open book stretch Manual Therapy: to decrease muscle spasm and pain and improve mobility R UPA mobs to lumbar spine, IASTM with foam roller to lumbar paraspinals, glutes and QL.   04/08/23 Therapeutic Exercise: to improve strength and mobility.  Demo, verbal and tactile cues throughout for technique. Standing side glides x 10  Standing lumbar extensions x 10 Supine PPT x 10  Bridges with TrA contraction 2 x 5  Marching with TrA contraction x 10  Bent knee fall outs with TrA contraction x 10  Manual Therapy: to decrease muscle spasm and  pain and improve mobility R UPA mobs to lumbar spine, IASTM with foam roller to lumbar paraspinals, glutes and QL.   04/02/23  Therapeutic Exercise: to improve strength and mobility.  Demo, verbal and tactile cues throughout for technique.  Nustep L3 x 6 min while status assessed  Side glides x 10 B - right hip to wall was preference today as she could feel it in her right LB Lumbar extensions at wall x 10 Prone on elbows abolishes pain Prone press ups x 10 Prone hip extension x 10 B Quadriped with alt leg ext x 5, then bird dog x 5 Plank on knees x 5 10 sec hold Standard plank x 1 Hooklying sequential march x 5 B 90/90 heel taps x 1 each (hard)   PATIENT EDUCATION:  Education details: HEP update Person educated: Patient Education method: Explanation, Demonstration, Verbal cues, and Handouts Education comprehension: verbalized understanding and returned demonstration  HOME EXERCISE PROGRAM: Access Code: [email or web address]  URL: [email or web address] Date: 04/11/2023 Prepared by: Harrie Foreman  Exercises - Supine Posterior Pelvic Tilt  - 1-2 x daily - 7 x weekly - 1 sets - 10 reps - Supine Bridge  - 2 x daily - 7 x weekly - 1-3 sets - 10 reps - Right Standing Lateral Shift Correction at Wall - Hold  - 2 x daily - 7 x weekly - 1 sets - 10 reps - 5-10 sec hold - Lying  Prone with 1 Pillow  - 1 x daily - 3-4 x weekly - 1 sets - 10 reps - Quadruped Leg Lifts  - 1 x daily - 3-4 x weekly - 1-2 sets - 10 reps - Bird Dog  - 1 x daily - 7 x weekly - 1-2 sets - 10 reps - 3-5 sec hold - Plank on Knees  - 1 x daily - 7 x weekly - 1 sets - 5 reps - max hold hold - Standard Plank  - 2 x daily - 7 x weekly - 1 sets - 5 reps - max hold - Seated Gaze Stabilization with Head Rotation  - 3 x daily - 7 x weekly - 3 sets - 10 reps - Seated Gaze Stabilization with Head Nod  - 3 x daily - 7 x weekly - 3 sets - 10 reps - Gastroc Stretch on Wall  - 1 x daily - 7 x weekly - 1 sets - 3 reps - 15  sec hold - Supine Lower Trunk Rotation  - 1 x daily - 7 x weekly - 1 sets - 10 reps - Supine Hip Internal and External Rotation  - 1 x daily - 7 x weekly - 1 sets - 10 reps - Hooklying Single Knee to Chest Stretch  - 1 x daily - 7 x weekly - 1 sets - 10 reps - Supine Figure 4 Piriformis Stretch  - 1 x daily - 7 x weekly - 1 sets - 3 reps - 10sec hold - Sidelying Open Book Thoracic Lumbar Rotation and Extension  - 1 x daily - 7 x weekly - 1 sets - 10 reps    Pelvic Press     Place hands under belly between navel and pubic bone, palms up. Feel pressure on hands. Increase pressure on hands by pressing pelvis down. This is NOT a pelvic tilt. Hold __5_ seconds. Relax. Repeat _10__ times. Once a day.  KNEE: Flexion - Prone   Hold pelvic press. Bend knee, then return the foot down. Repeat on opposite leg. Do not raise hips. _5-10__ reps per set. When this is mastered, pull both heels up at same time, x 10 reps.  Once a day   Leg Lift: One-Leg   Press pelvis down. Keep knee straight; lengthen and lift one leg (from waist). Do not twist body. Keep other leg down. Hold _2__ seconds. Relax. Repeat 10 time. Repeat with other leg.    ASSESSMENT:  CLINICAL IMPRESSION: Lauren Soto reported having difficulty sleeping and stiffiness in low back, so worked on stretching routine that could be performed at night to help address and relax lumbar region with good tolerance.   Lauren Soto continues to demonstrate potential for improvement and would benefit from continued skilled therapy to address impairments.       OBJECTIVE IMPAIRMENTS: decreased activity tolerance, decreased endurance, decreased mobility, difficulty walking, decreased ROM, decreased strength, increased fascial restrictions, impaired perceived functional ability, increased muscle spasms, impaired flexibility, postural dysfunction, and pain.   ACTIVITY LIMITATIONS: carrying, lifting, bending, sitting, standing, squatting,  sleeping, transfers, bed mobility, and locomotion level  PARTICIPATION LIMITATIONS: meal prep, cleaning, laundry, shopping, community activity, and occupation  PERSONAL FACTORS: Time since onset of injury/illness/exacerbation and 3+ comorbidities: T2DM, HTN, vertigo & migraines, chronic LBP, history of abdominal hysterectomy and umbilical hernia repair.  are also affecting patient's functional outcome.   REHAB POTENTIAL: Good  CLINICAL DECISION MAKING: Evolving/moderate complexity  EVALUATION COMPLEXITY: Moderate   GOALS: Goals reviewed with patient?  Yes  SHORT TERM GOALS: Target date: 03/19/2023   Patient will be independent with initial HEP.  Baseline:  Goal status: MET 03/17/23   LONG TERM GOALS: Target date: 04/16/2023   Patient will be independent with advanced/ongoing HEP to improve outcomes and carryover.  Baseline:  Goal status: IN PROGRESS  2.  Patient will report 75% improvement in low back pain to improve QOL.  Baseline:  Goal status: IN PROGRESS  3.  Patient will demonstrate full pain free lumbar ROM to perform ADLs.   Baseline: see objective Goal status: IN PROGRESS  4.  Patient will demonstrate improved functional strength as demonstrated by 4+/5 R hip strength. Baseline: see objective Goal status: IN PROGRESS  5.  Patient will report at least 6 points improvement on modified Oswestry to demonstrate improved functional ability.  Baseline: 27/50 Goal status: IN PROGRESS   6.  Patient will report being able to sleep without interruption from LBP to improve QOL. Baseline: reports sleeping only 1/4 normal amount due to pain Goal status: IN PROGRESS  7.   Patient will report centralization of radicular symptoms.  Baseline: radiates down RLE to calf and groin Goal status: IN PROGRESS 04/08/23- centralized to R buttock now  PLAN:  PT FREQUENCY: 1-2x/week  PT DURATION: 6 weeks  PLANNED INTERVENTIONS: Therapeutic exercises, Therapeutic activity,  Neuromuscular re-education, Balance training, Gait training, Patient/Family education, Self Care, Joint mobilization, Joint manipulation, Orthotic/Fit training, Dry Needling, Electrical stimulation, Spinal manipulation, Spinal mobilization, Cryotherapy, Moist heat, Taping, Traction, Ultrasound, Manual therapy, and Re-evaluation.  PLAN FOR NEXT SESSION: Continue to progress therex - tolerating mckenzie well, needs more core strengthening, manual and modalities PRN, did not respond well to TrDN.    Jena Gauss, PT, DPT  04/11/2023, 12:48 PM

## 2023-04-11 NOTE — Addendum Note (Signed)
Addended by: Hyman Hopes B on: 04/11/2023 09:06 AM   Modules accepted: Orders

## 2023-04-14 NOTE — Telephone Encounter (Signed)
PA denied for Ajovy,   Message reads: This drug is covered if you meet the following, You have tried one fo the following drugs at least one month:  A) Antidepressants- nortriptyline  B) Anti-Epilepics_  C) antihypertensive medications:  lisinopril

## 2023-04-14 NOTE — Telephone Encounter (Signed)
I left detailed message on machine Lauren Soto has not received papers, to please fax again.

## 2023-04-14 NOTE — Telephone Encounter (Signed)
Patient fax over Appeal form signed.

## 2023-04-15 ENCOUNTER — Ambulatory Visit: Payer: Medicaid Other | Admitting: Physical Therapy

## 2023-04-15 ENCOUNTER — Encounter: Payer: Self-pay | Admitting: Physical Therapy

## 2023-04-15 DIAGNOSIS — M5416 Radiculopathy, lumbar region: Secondary | ICD-10-CM

## 2023-04-15 DIAGNOSIS — M25551 Pain in right hip: Secondary | ICD-10-CM

## 2023-04-15 DIAGNOSIS — M6281 Muscle weakness (generalized): Secondary | ICD-10-CM

## 2023-04-15 DIAGNOSIS — R252 Cramp and spasm: Secondary | ICD-10-CM

## 2023-04-15 NOTE — Therapy (Signed)
OUTPATIENT PHYSICAL THERAPY THORACOLUMBAR TREATMENT Progress Note Reporting Period 03/05/23 to 04/15/23  See note below for Objective Data and Assessment of Progress/Goals.      Patient Name: Lauren Soto MRN: [id number] DOB:March 12, 1971, 52 y.o., female Today's Date: 04/15/2023  END OF SESSION:  PT End of Session - 04/15/23 1457     Visit Number 9    Date for PT Re-Evaluation 04/16/23    Authorization Type [employer] Medicaid    PT Start Time 1454    PT Stop Time 1530    PT Time Calculation (min) 36 min    Activity Tolerance Patient tolerated treatment well    Behavior During Therapy [facility] for tasks assessed/performed                Past Medical History:  Diagnosis Date   Acute cystitis 02/03/2023   AKI (acute kidney injury) (HCC) 02/02/2023   Asthma 04/21/2014   Chronic right hip pain 11/06/2022   Diabetes mellitus without complication (HCC)    Essential hypertension 05/25/2012   GERD (gastroesophageal reflux disease) 02/03/2023   Hyperlipidemia 11/06/2022   Hypertension    Long term current use of oral hypoglycemic drug 11/06/2022   Menorrhagia 06/08/2012   Non-insulin dependent type 2 diabetes mellitus (HCC) 06/18/2018   Pelvic pain in female 06/08/2012   Postural dizziness with presyncope 02/03/2023   Severe sepsis (HCC) 02/03/2023   Uterine fibroid 05/25/2012   Vertigo    Vestibular migraine 07/06/2018   Past Surgical History:  Procedure Laterality Date   ABDOMINAL HYSTERECTOMY     BUNIONECTOMY     CHOLECYSTECTOMY     HERNIA REPAIR     umblical   SHOULDER ARTHROSCOPY     Patient Active Problem List   Diagnosis Date Noted   Obesity (BMI 35.0-39.9 without comorbidity) 03/19/2023   Diabetes mellitus without complication (HCC)    Hypertension    Vertigo    Severe sepsis (HCC) 02/03/2023   Postural dizziness with presyncope 02/03/2023   GERD (gastroesophageal reflux disease) 02/03/2023   Acute cystitis 02/03/2023   AKI (acute kidney injury)  (HCC) 02/02/2023   Long term current use of oral hypoglycemic drug 11/06/2022   Chronic right hip pain 11/06/2022   Hyperlipidemia 11/06/2022   Vestibular migraine 07/06/2018   Non-insulin dependent type 2 diabetes mellitus (HCC) 06/18/2018   Asthma 04/21/2014   Menorrhagia 06/08/2012   Pelvic pain in female 06/08/2012   Essential hypertension 05/25/2012   Uterine fibroid 05/25/2012    PCP: Clayborne Dana, NP   REFERRING PROVIDER: Cammy Copa, MD   REFERRING DIAG: M54.16 (ICD-10-CM) - Lumbar radiculopathy   Rationale for Evaluation and Treatment: Rehabilitation  THERAPY DIAG:  Radiculopathy, lumbar region  Cramp and spasm  Muscle weakness (generalized)  Pain in right hip  ONSET DATE: chronic low back pain years, Leg pain started November 2023.   SUBJECTIVE:  SUBJECTIVE STATEMENT: Still having pain in the R hip radiating down posterior thigh and groin.  Has a new job standing  Eval: Pain has been ongoing for about a year.  Pain is in back, right hip and leg radiating to down R calf, mild pain into groin as well. Had an epidural steroid injection with Dr. Alvester Morin on 01/21/2023, which helped 1-2 days.  Also was given a cortisone injection in her hip on 11/29/22.  It didn't help either.  Pain seems to be spreading and she's not sure if its coming from her back or hip.  Chronic back pain has been going on for years, has been checked for RA and that was ruled out.  The leg pain started in November.   PERTINENT HISTORY:  T2DM, HTN, vertigo & migraines, chronic LBP, history of abdominal hysterectomy and umbilical hernia repair.   PAIN:  Are you having pain? Yes: NPRS scale: 7/10 Pain location: midline low back, radiates up, and down Right leg into groin, aldo buttock down to knee, sometimes to  calf.  Pain description: throbbing, achey, sharp in groin Aggravating factors: pain increases past 10/10, increased activity, moving a lot or prolonged sitting Relieving factors: sometimes a heating pad  PRECAUTIONS: None  RED FLAGS: None   WEIGHT BEARING RESTRICTIONS: No  FALLS:  Has patient fallen in last 6 months? No however R knee occasionally buckles/feels weak.   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 Subtle anterolisthesis at L4-L5 with at least moderate facet arthropathy. No spinal stenosis but mild lateral recess and left foraminal stenosis at that level. 2. L5-S1 disc degeneration with a small rightward disc herniation most affecting the right lateral recess. Query right S1 radiculitis. 3. Partially visible lower thoracic disc degeneration with up to mild lower thoracic spinal stenosis. Visible lower spinal cord at those levels seems to remain normal.  11/24/22 MR Pelvis IMPRESSION: Moderate tendinosis with partial tearing of the proximal hamstring tendons bilaterally.   Mild tendinosis of the distal gluteus minimus tendons bilaterally with associated peritrochanteric edema, left greater than right.   Mild osteoarthritis of the hips.  PATIENT SURVEYS:  Modified Oswestry 27/50= 54% severe   COGNITION: Overall cognitive status: Within functional limits for tasks assessed     SENSATION: Not tested  MUSCLE LENGTH: Hamstrings: severe tightness bil  Quadriceps: severe tightness bil   POSTURE:  sits with weight shift to left  PALPATION: Tenderness with PA mob to L3-5, sacrum, bil SIJ tenderness throughout lumbar paraspinals, QL R gluteals,  piriformis and greater trochanter,   LUMBAR ROM:   AROM eval 04/15/2023  Flexion To knees, p! Past knees, p!  Extension Limited 90!, p! Limited 50%, p!  Right lateral flexion To mid thigh P! To knees, p!  Left lateral flexion To mid thigh p! To knee  Right rotation Limited 50% Limited 50%  Left rotation Limited 50% Limited 50%   (Blank rows = not tested)  LOWER EXTREMITY ROM:   good hip IR/ER in R hip but painful, decreased hip IR on L compared to R   LOWER EXTREMITY MMT:    MMT Right eval Left eval Right 04/15/23 Left 04/15/23  Hip flexion 3+ 4+ 4 5  Hip extension  4- 4-    Hip abduction 4+ 5 5 5   Hip adduction 4+ 4+ 4+ 4+  Knee flexion 4 5 5 5   Knee extension 4+ 5 5 5   Ankle dorsiflexion 5 5    Ankle plantarflexion 5 5     (Blank rows = not tested)  LUMBAR SPECIAL TESTS:  Straight leg raise test: positive on R and cross straight leg, Slump test: Positive, and FABER test: Positive  FUNCTIONAL TESTS:   GAIT: Distance walked: 71' Assistive device utilized: None Level of assistance: Complete Independence Comments: visually slow, no significant deviation but does report intermittent R knee buckling.   TODAY'S TREATMENT:                                                                                                                              DATE:   04/15/23  Vitals 150/100 MMT Lumbar ROM Review of goals and progress Imaging Review of HEP Discussion of TrDN   04/10/23 Therapeutic Exercise: to improve strength and mobility.  Demo, verbal and tactile cues throughout for technique. Nustep L4 x 6 min  Demo - calf stretches LTR - starting small gradually increasing amplitude SKTC stretch  Piriformis stretches Windshield wipers Open book stretch Manual Therapy: to decrease muscle spasm and pain and improve mobility R UPA mobs to lumbar spine, IASTM with foam roller to lumbar paraspinals, glutes and QL.   04/08/23 Therapeutic Exercise: to improve strength and  mobility.  Demo, verbal and tactile cues throughout for technique. Standing side glides x 10  Standing lumbar extensions x 10 Supine PPT x 10  Bridges with TrA contraction 2 x 5  Marching with TrA contraction x 10  Bent knee fall outs with TrA contraction x 10  Manual Therapy: to decrease muscle spasm and pain and improve mobility R UPA mobs to lumbar spine, IASTM with foam roller to lumbar paraspinals, glutes and QL.   04/02/23  Therapeutic Exercise: to improve strength and mobility.  Demo, verbal and tactile cues throughout for technique.  Nustep L3 x 6 min while status assessed  Side glides x 10 B - right hip to wall was preference today as she could feel it in her right LB Lumbar extensions at wall x 10 Prone on elbows abolishes pain Prone press ups x 10 Prone hip extension x 10 B Quadriped with alt leg ext x 5, then bird dog x 5 Plank on knees x 5 10 sec hold Standard plank x 1 Hooklying sequential march x 5 B 90/90 heel taps x 1 each (hard)   PATIENT EDUCATION:  Education details: HEP update Person educated: Patient Education method: Explanation, Demonstration, Verbal cues, and Handouts Education comprehension: verbalized understanding and returned demonstration  HOME EXERCISE PROGRAM: Access Code: [email or web address]  URL: [email or web address] Date: 04/11/2023 Prepared by: Harrie Foreman  Exercises - Supine Posterior Pelvic Tilt  - 1-2 x daily - 7 x weekly - 1 sets - 10 reps - Supine Bridge  - 2  x daily - 7 x weekly - 1-3 sets - 10 reps - Right Standing Lateral Shift Correction at Wall - Hold  - 2 x daily - 7 x weekly - 1 sets - 10 reps - 5-10 sec hold - Lying Prone with 1 Pillow  - 1 x daily - 3-4 x weekly - 1 sets - 10 reps - Quadruped Leg Lifts  - 1 x daily - 3-4 x weekly - 1-2 sets - 10 reps - Bird Dog  - 1 x daily - 7 x weekly - 1-2 sets - 10 reps - 3-5 sec hold - Plank on Knees  - 1 x daily - 7 x weekly - 1 sets - 5 reps - max hold hold - Standard Plank   - 2 x daily - 7 x weekly - 1 sets - 5 reps - max hold - Seated Gaze Stabilization with Head Rotation  - 3 x daily - 7 x weekly - 3 sets - 10 reps - Seated Gaze Stabilization with Head Nod  - 3 x daily - 7 x weekly - 3 sets - 10 reps - Gastroc Stretch on Wall  - 1 x daily - 7 x weekly - 1 sets - 3 reps - 15 sec hold - Supine Lower Trunk Rotation  - 1 x daily - 7 x weekly - 1 sets - 10 reps - Supine Hip Internal and External Rotation  - 1 x daily - 7 x weekly - 1 sets - 10 reps - Hooklying Single Knee to Chest Stretch  - 1 x daily - 7 x weekly - 1 sets - 10 reps - Supine Figure 4 Piriformis Stretch  - 1 x daily - 7 x weekly - 1 sets - 3 reps - 10sec hold - Sidelying Open Book Thoracic Lumbar Rotation and Extension  - 1 x daily - 7 x weekly - 1 sets - 10 reps    Pelvic Press     Place hands under belly between navel and pubic bone, palms up. Feel pressure on hands. Increase pressure on hands by pressing pelvis down. This is NOT a pelvic tilt. Hold __5_ seconds. Relax. Repeat _10__ times. Once a day.  KNEE: Flexion - Prone   Hold pelvic press. Bend knee, then return the foot down. Repeat on opposite leg. Do not raise hips. _5-10__ reps per set. When this is mastered, pull both heels up at same time, x 10 reps.  Once a day   Leg Lift: One-Leg   Press pelvis down. Keep knee straight; lengthen and lift one leg (from waist). Do not twist body. Keep other leg down. Hold _2__ seconds. Relax. Repeat 10 time. Repeat with other leg.    ASSESSMENT:  CLINICAL IMPRESSION: Lauren Soto continues to report pain primarily localized now in R buttock.  She reports about 20% improvement overall.  Discussed today different options, recommended that she does contact her referring provider for next steps, but can try some different interventions as well, including traction, and she is willing to trial TrDN to low back possibly.   Her LE strength is improving but would still benefit from more hip  strengthening.  Recommend continue skilled therapy 1-2x/week for additional 4 weeks.   Lauren Soto continues to demonstrate potential for improvement and would benefit from continued skilled therapy to address impairments.       OBJECTIVE IMPAIRMENTS: decreased activity tolerance, decreased endurance, decreased mobility, difficulty walking, decreased ROM, decreased strength, increased fascial restrictions, impaired perceived functional  ability, increased muscle spasms, impaired flexibility, postural dysfunction, and pain.   ACTIVITY LIMITATIONS: carrying, lifting, bending, sitting, standing, squatting, sleeping, transfers, bed mobility, and locomotion level  PARTICIPATION LIMITATIONS: meal prep, cleaning, laundry, shopping, community activity, and occupation  PERSONAL FACTORS: Time since onset of injury/illness/exacerbation and 3+ comorbidities: T2DM, HTN, vertigo & migraines, chronic LBP, history of abdominal hysterectomy and umbilical hernia repair.  are also affecting patient's functional outcome.   REHAB POTENTIAL: Good  CLINICAL DECISION MAKING: Evolving/moderate complexity  EVALUATION COMPLEXITY: Moderate   GOALS: Goals reviewed with patient? Yes  SHORT TERM GOALS: Target date: 03/19/2023   Patient will be independent with initial HEP.  Baseline:  Goal status: MET 03/17/23   LONG TERM GOALS: Target date: 04/16/2023  extended to 05/13/2023 Patient will be independent with advanced/ongoing HEP to improve outcomes and carryover.  Baseline:  Goal status: MET 04/15/23- met for current   2.  Patient will report 75% improvement in low back pain to improve QOL.  Baseline:  Goal status: IN PROGRESS 04/15/23- 20% improvement.   3.  Patient will demonstrate full pain free lumbar ROM to perform ADLs.   Baseline: see objective Goal status: IN PROGRESS  4.  Patient will demonstrate improved functional strength as demonstrated by 4+/5 R hip strength. Baseline: see  objective Goal status: IN PROGRESS 04/15/23 see objective  5.  Patient will report at least 6 points improvement on modified Oswestry to demonstrate improved functional ability.  Baseline: 27/50 Goal status: IN PROGRESS   6.  Patient will report being able to sleep without interruption from LBP to improve QOL. Baseline: reports sleeping only 1/4 normal amount due to pain Goal status: NOT MET 04/15/23- no improvement  7.   Patient will report centralization of radicular symptoms.  Baseline: radiates down RLE to calf and groin Goal status: IN PROGRESS 04/08/23- centralized to R buttock now  PLAN:  PT FREQUENCY: 1-2x/week  PT DURATION: 6 weeks extended to 05/13/2023  PLANNED INTERVENTIONS: Therapeutic exercises, Therapeutic activity, Neuromuscular re-education, Balance training, Gait training, Patient/Family education, Self Care, Joint mobilization, Joint manipulation, Orthotic/Fit training, Dry Needling, Electrical stimulation, Spinal manipulation, Spinal mobilization, Cryotherapy, Moist heat, Taping, Traction, Ultrasound, Manual therapy, and Re-evaluation.  PLAN FOR NEXT SESSION: Continue to progress therex - tolerating mckenzie well, needs more core strengthening, manual and modalities PRN, did not respond well to TrDN.    Jena Gauss, PT, DPT  04/15/2023, 5:43 PM

## 2023-04-16 ENCOUNTER — Telehealth: Payer: Self-pay | Admitting: Orthopedic Surgery

## 2023-04-16 DIAGNOSIS — M5416 Radiculopathy, lumbar region: Secondary | ICD-10-CM

## 2023-04-16 NOTE — Telephone Encounter (Signed)
I called and spoke with patient.  She has a complicated clinical picture.  She had lumbar spine ESI with Dr. Alvester Morin several months ago that gave her really no relief according to her.  She also had prior trochanteric injection that gave her no relief.  Seems based on her symptoms complained primarily of buttock pain that is worse with sitting and a history of hamstring weakness based on my exam, think that the proximal hamstring tearing that she has is probably the most symptomatic thing for her.  Physical therapy has not been helping.  Can we refer her to see Dr. Shon Baton for his evaluation and for consideration of shockwave therapy versus PRP injection?

## 2023-04-16 NOTE — Telephone Encounter (Signed)
Pt is requesting a call to discuss the next step; further info

## 2023-04-16 NOTE — Telephone Encounter (Signed)
Referral for Dr. Shon Baton placed in chart

## 2023-04-17 NOTE — Telephone Encounter (Signed)
Pharmacy Patient Advocate Encounter  Received notification from [facility] MEDICAID that Prior Authorization for AJOVY  has been DENIED.  See denial reason below. No denial letter attached in CMM. Will attache denial letter to Media tab once received.   PA #/Case ID/Reference #: [id number]

## 2023-04-17 NOTE — Telephone Encounter (Signed)
Appeal has been submitted. Will advise when response is received or follow up in 1 week. Please be advised that most companies may take 30 days to make a decision.  Request was sent via fax as expedited to [phone]

## 2023-04-21 ENCOUNTER — Telehealth: Payer: Self-pay | Admitting: Family Medicine

## 2023-04-21 ENCOUNTER — Ambulatory Visit (INDEPENDENT_AMBULATORY_CARE_PROVIDER_SITE_OTHER): Payer: Medicaid Other | Admitting: Physical Medicine and Rehabilitation

## 2023-04-21 ENCOUNTER — Encounter: Payer: Self-pay | Admitting: Physical Medicine and Rehabilitation

## 2023-04-21 DIAGNOSIS — M7918 Myalgia, other site: Secondary | ICD-10-CM

## 2023-04-21 DIAGNOSIS — M5116 Intervertebral disc disorders with radiculopathy, lumbar region: Secondary | ICD-10-CM

## 2023-04-21 DIAGNOSIS — M5441 Lumbago with sciatica, right side: Secondary | ICD-10-CM | POA: Diagnosis not present

## 2023-04-21 DIAGNOSIS — G8929 Other chronic pain: Secondary | ICD-10-CM

## 2023-04-21 DIAGNOSIS — M5416 Radiculopathy, lumbar region: Secondary | ICD-10-CM

## 2023-04-21 NOTE — Progress Notes (Signed)
Functional Pain Scale - descriptive words and definitions  Distressing (6)    Pain is present/unable to complete most ADLs limited by pain/sleep is difficult and active distraction is only marginal. Moderate range order  Average Pain 9-10  Lower back pain on both sides, but worse on the right with radiation in the back of the right leg and groin

## 2023-04-21 NOTE — Progress Notes (Signed)
Lauren Soto - 52 y.o. female MRN [id number]  Date of birth: 1971-03-07  Office Visit Note: Visit Date: 04/21/2023 PCP: Clayborne Dana, NP Referred by: Julieanne Cotton, PA-C  Subjective: Chief Complaint  Patient presents with   Lower Back - Pain   HPI: Lauren Soto is a 52 y.o. female who comes in today for evaluation of chronic, worsening and severe right sided lower back pain radiating to buttock, hip, groin and down posterolateral leg. Pain ongoing for over 1 years, worsens with prolonged sitting, walking and standing. She describes her pain as sharp and stabbing, currently rates as 8 out of 10. Some relief of pain with home exercise regimen, rest and use of medications. History of formal physical therapy with minimal relief of pain. States she was unable to tolerate dry needling treatments. Lumbar MRI imaging from May exhibits small rightward disc herniation at L5-S1 most affecting the right lateral recess. Query right S1 radiculitis. There is moderate facet and ligament flavum hypertrophy and degenerative facet joint fluid at L4-L5. Right hip x-ray from May exhibits normal appearance of right hip. She underwent right greater trochanter injection with our partner Karenann Cai, [place] on 11/29/2022, minimal relief of pain lasting about 2 days. More recently she underwent right L5-S1 interlaminar epidural steroid injection in our office on 01/20/2023, minimal relief of pain for 1 day with this procedure.       Oswestry Disability Index Score 64% 30 to 40 (80%): very severe disability: Back pain impinges on all aspects of the patient's life. Positive intervention is required.  Review of Systems  Musculoskeletal:  Positive for back pain and myalgias.  Neurological:  Negative for tingling, sensory change, focal weakness and weakness.  All other systems reviewed and are negative.  Otherwise per HPI.  Assessment & Plan: Visit Diagnoses:    ICD-10-CM   1. Chronic  right-sided low back pain with right-sided sciatica  M54.41 Ambulatory referral to Physical Medicine Rehab   G89.29     2. Lumbar radiculopathy  M54.16 Ambulatory referral to Physical Medicine Rehab    3. Intervertebral disc disorders with radiculopathy, lumbar region  M51.16 Ambulatory referral to Physical Medicine Rehab    4. Myofascial pain syndrome  M79.18 Ambulatory referral to Physical Medicine Rehab       Plan: Findings:  Chronic, worsening and severe right sided lower back pain radiating to buttock, groin, hip and down posterolateral leg. Patient continues to have severe pain despite good conservative therapies such as formal physical therapy/dry needling, home exercise regimen, rest and use of medications. Patients clinical presentation and exam are consistent with S1 nerve pattern. Right groin pain seems more consistent with intrinsic right hip issue. I also feel there could be a type of central sensitization syndrome such as fibromyalgia contributing to her pain. She is tender to palpation to lumbar paraspinal regions, hips and greater trochanter regions. Next step is to perform right S1 transforaminal epidural steroid injection under fluoroscopic guidance. If good relief of pain with injection we can repeat this procedure infrequently as needed. If her pain persists post injection would consider medication management with Cymbalta, she could also talk with her PCP regarding treatment of chronic pain/fibromyalgia. We will see her back for injection. No red flag symptoms noted upon exam today.     Meds & Orders: No orders of the defined types were placed in this encounter.   Orders Placed This Encounter  Procedures   Ambulatory referral to Physical Medicine Rehab  Follow-up: Return for Right S1 transforaminal epidural steroid injection.   Procedures: No procedures performed      Clinical History: MRI LUMBAR SPINE WITHOUT CONTRAST   TECHNIQUE: Multiplanar, multisequence MR  imaging of the lumbar spine was performed. No intravenous contrast was administered.   COMPARISON:  Lumbar radiographs 11/18/2022.   FINDINGS: Segmentation:  Normal on the comparison.   Alignment: Normal to mildly exaggerated lumbar lordosis. Subtle grade 1 anterolisthesis of L4 on L5 (only 2 mm on these images) was more conspicuous on the radiographs. No significant scoliosis.   Vertebrae: Visualized bone marrow signal is within normal limits. No marrow edema or evidence of acute osseous abnormality. Intact visible sacrum.   Conus medullaris and cauda equina: Conus extends to the L1 level. No lower spinal cord or conus signal abnormality.   Paraspinal and other soft tissues: Negative; large body habitus.   Disc levels:   Multilevel lower thoracic disc desiccation and disc bulging is partially visible (series 3, image 9). Up to mild lower thoracic spinal stenosis at the level of the lower spinal cord (T11-T12:), but no definite cord mass effect or signal abnormality.   T12-L1:  Negative.   L1-L2:  Negative.   L2-L3:  Negative disc.  Mild facet hypertrophy.  No stenosis.   L3-L4: Negative disc. Mild right foraminal disc bulge and endplate spurring. Otherwise negative disc. Mild to moderate facet hypertrophy. Borderline to mild right L3 foraminal stenosis.   L4-L5: Subtle anterolisthesis. Moderate facet and ligament flavum hypertrophy. Degenerative facet joint fluid. No spinal stenosis. Up to mild lateral recess stenosis (L5 nerve levels) and left L4 neural foraminal stenosis.   L5-S1: Subtle disc desiccation. Broad-based central to right paracentral disc protrusion (series 6, image 38 and series 3, image 8). Mild facet hypertrophy. No spinal stenosis. Mild mass effect on the descending right S1 nerve roots in the lateral recess. Borderline to mild bilateral L5 foraminal stenosis.   IMPRESSION: 1. Subtle anterolisthesis at L4-L5 with at least moderate  facet arthropathy. No spinal stenosis but mild lateral recess and left foraminal stenosis at that level. 2. L5-S1 disc degeneration with a small rightward disc herniation most affecting the right lateral recess. Query right S1 radiculitis. 3. Partially visible lower thoracic disc degeneration with up to mild lower thoracic spinal stenosis. Visible lower spinal cord at those levels seems to remain normal.     Electronically Signed   By: Odessa Fleming M.D.   She reports that she quit smoking about 15 years ago. Her smoking use included cigarettes. She has never used smokeless tobacco.  Recent Labs    11/06/22 1443 02/03/23 0346  HGBA1C 7.5* 7.3*    Objective:  VS:  HT:    WT:   BMI:     BP:   HR: bpm  TEMP: ( )  RESP:  Physical Exam Vitals and nursing note reviewed.  HENT:     Head: Normocephalic and atraumatic.     Right Ear: External ear normal.     Left Ear: External ear normal.     Nose: Nose normal.     Mouth/Throat:     Mouth: Mucous membranes are moist.  Eyes:     Extraocular Movements: Extraocular movements intact.  Cardiovascular:     Rate and Rhythm: Normal rate.     Pulses: Normal pulses.  Pulmonary:     Effort: Pulmonary effort is normal.  Abdominal:     General: Abdomen is flat. There is no distension.  Musculoskeletal:  General: Tenderness present.     Cervical back: Normal range of motion.     Comments: Patient rises from seated position to standing without difficulty. Good lumbar range of motion. No pain noted with facet loading. 5/5 strength noted with bilateral hip flexion, knee flexion/extension, ankle dorsiflexion/plantarflexion and EHL. No clonus noted bilaterally. No pain upon palpation of greater trochanters. No pain with internal/external rotation of bilateral hips. Sensation intact bilaterally. Dysesthesias noted to right S1 dermatomes. Negative slump test bilaterally. Ambulates without aid, gait steady.     Skin:    General: Skin is warm and  dry.     Capillary Refill: Capillary refill takes less than 2 seconds.  Neurological:     General: No focal deficit present.     Mental Status: She is alert and oriented to person, place, and time.  Psychiatric:        Mood and Affect: Mood normal.        Behavior: Behavior normal.     Ortho Exam  Imaging: No results found.  Past Medical/Family/Surgical/Social History: Medications & Allergies reviewed per EMR, new medications updated. Patient Active Problem List   Diagnosis Date Noted   Obesity (BMI 35.0-39.9 without comorbidity) 03/19/2023   Diabetes mellitus without complication (HCC)    Hypertension    Vertigo    Severe sepsis (HCC) 02/03/2023   Postural dizziness with presyncope 02/03/2023   GERD (gastroesophageal reflux disease) 02/03/2023   Acute cystitis 02/03/2023   AKI (acute kidney injury) (HCC) 02/02/2023   Long term current use of oral hypoglycemic drug 11/06/2022   Chronic right hip pain 11/06/2022   Hyperlipidemia 11/06/2022   Vestibular migraine 07/06/2018   Non-insulin dependent type 2 diabetes mellitus (HCC) 06/18/2018   Asthma 04/21/2014   Menorrhagia 06/08/2012   Pelvic pain in female 06/08/2012   Essential hypertension 05/25/2012   Uterine fibroid 05/25/2012   Past Medical History:  Diagnosis Date   Acute cystitis 02/03/2023   AKI (acute kidney injury) (HCC) 02/02/2023   Asthma 04/21/2014   Chronic right hip pain 11/06/2022   Diabetes mellitus without complication (HCC)    Essential hypertension 05/25/2012   GERD (gastroesophageal reflux disease) 02/03/2023   Hyperlipidemia 11/06/2022   Hypertension    Long term current use of oral hypoglycemic drug 11/06/2022   Menorrhagia 06/08/2012   Non-insulin dependent type 2 diabetes mellitus (HCC) 06/18/2018   Pelvic pain in female 06/08/2012   Postural dizziness with presyncope 02/03/2023   Severe sepsis (HCC) 02/03/2023   Uterine fibroid 05/25/2012   Vertigo    Vestibular migraine 07/06/2018    Family History  Problem Relation Age of Onset   Neuropathy Mother    Dementia Mother    Hypertension Mother    Multiple myeloma Mother    Arthritis Mother    Dementia Father    Hypertension Father    Past Surgical History:  Procedure Laterality Date   ABDOMINAL HYSTERECTOMY     BUNIONECTOMY     CHOLECYSTECTOMY     HERNIA REPAIR     umblical   SHOULDER ARTHROSCOPY     Social History   Occupational History   Not on file  Tobacco Use   Smoking status: Former    Current packs/day: 0.00    Types: Cigarettes    Quit date: 2009    Years since quitting: 15.8   Smokeless tobacco: Never   Tobacco comments:    "Every once in awhile will vape"  Vaping Use   Vaping status: Former   Quit date:  07/04/2022  Substance and Sexual Activity   Alcohol use: No   Drug use: No   Sexual activity: Yes    Birth control/protection: Surgical

## 2023-04-21 NOTE — Telephone Encounter (Signed)
Patient called to see if we were aware that the patch for dizziness needs a PA. Please advise status of this.   Pt also stated that [facility] requested previous images to be requested before they scheduled. Pt said she called that location and they said that a [facility] needs [facility] that info. Pt said she called back and left this on voicemail but hasn't heard back. Pt wants to know if we are able to forward the previous images/results to them. Please advise

## 2023-04-22 ENCOUNTER — Ambulatory Visit: Payer: Medicaid Other | Admitting: Family Medicine

## 2023-04-22 NOTE — Telephone Encounter (Signed)
Sent a request to [facility] for Mammogram films.   Sending to PA team for request of PA on Scopolamine patches. Please advise.

## 2023-04-23 ENCOUNTER — Encounter: Payer: Self-pay | Admitting: Family Medicine

## 2023-04-23 ENCOUNTER — Ambulatory Visit (INDEPENDENT_AMBULATORY_CARE_PROVIDER_SITE_OTHER): Payer: Medicaid Other | Admitting: Family Medicine

## 2023-04-23 ENCOUNTER — Telehealth: Payer: Self-pay

## 2023-04-23 VITALS — BP 133/75 | HR 84 | Ht 60.0 in | Wt 196.8 lb

## 2023-04-23 DIAGNOSIS — I10 Essential (primary) hypertension: Secondary | ICD-10-CM | POA: Diagnosis not present

## 2023-04-23 DIAGNOSIS — F411 Generalized anxiety disorder: Secondary | ICD-10-CM

## 2023-04-23 MED ORDER — DIAZEPAM 5 MG PO TABS: ORAL_TABLET | ORAL | 0 refills | Status: DC

## 2023-04-23 NOTE — Assessment & Plan Note (Signed)
>>  ASSESSMENT AND PLAN FOR HYPERTENSION WRITTEN ON 04/23/2023  2:15 PM BY Decklyn Hyder B, NP  Blood pressure is at goal for age and co-morbidities.   Recommendations: hctz 12.5 mg daily, lisinopril 40 daily, metoprolol succinate 50 mg daily - BP goal <130/80 - monitor and log blood pressures at home - check around the same time each day in a relaxed setting - Limit salt to <2000 mg/day - Follow DASH eating plan (heart healthy diet) - limit alcohol to 2 standard drinks per day for men and 1 per day for women - avoid tobacco products - get at least 2 hours of regular aerobic exercise weekly Patient aware of signs/symptoms requiring further/urgent evaluation. BMP updated today

## 2023-04-23 NOTE — Assessment & Plan Note (Signed)
Blood pressure is at goal for age and co-morbidities.   Recommendations: hctz 12.5 mg daily, lisinopril 40 daily, metoprolol succinate 50 mg daily - BP goal <130/80 - monitor and log blood pressures at home - check around the same time each day in a relaxed setting - Limit salt to <2000 mg/day - Follow DASH eating plan (heart healthy diet) - limit alcohol to 2 standard drinks per day for men and 1 per day for women - avoid tobacco products - get at least 2 hours of regular aerobic exercise weekly Patient aware of signs/symptoms requiring further/urgent evaluation. BMP updated today

## 2023-04-23 NOTE — Progress Notes (Signed)
Established Patient Office Visit  Subjective   Patient ID: Lauren Soto, female    DOB: February 23, 1971  Age: 52 y.o. MRN: [id number]  CC: HTN follow-up   HPI   Patient is here for HTN follow-up.      Hypertension: - Medications: hctz 12.5 mg daily, lisinopril 40 mg daily, metoprolol succinate 50 mg daily - Compliance: good - Checking BP at home: no - Denies any SOB, recurrent headaches, CP, vision changes, LE edema, dizziness, palpitations, or medication side effects. - Diet: heart healthy - Exercise: walking - Stressors: new job, school cafeteria  BP Readings from Last 3 Encounters:  04/23/23 133/75  04/08/23 (!) 160/95  03/28/23 (!) 149/93       ROS All review of systems negative except what is listed in the HPI    Objective:     BP 133/75   Pulse 84   Ht 5' (1.524 m)   Wt 196 lb 12.8 oz (89.3 kg)   LMP 06/04/2012   SpO2 97%   BMI 38.43 kg/m    Physical Exam Vitals reviewed.  Constitutional:      Appearance: Normal appearance. She is obese.  Cardiovascular:     Rate and Rhythm: Normal rate and regular rhythm.     Heart sounds: Normal heart sounds.  Pulmonary:     Effort: Pulmonary effort is normal.     Breath sounds: Normal breath sounds.  Skin:    General: Skin is warm and dry.  Neurological:     Mental Status: She is alert and oriented to person, place, and time.  Psychiatric:        Mood and Affect: Mood normal.        Behavior: Behavior normal.        Thought Content: Thought content normal.        Judgment: Judgment normal.      No results found for any visits on 04/23/23.    The 10-year ASCVD risk score (Arnett DK, et al., 2019) is: 8.9%    Assessment & Plan:   Problem List Items Addressed This Visit       Active Problems   Hypertension - Primary    Blood pressure is at goal for age and co-morbidities.   Recommendations: hctz 12.5 mg daily, lisinopril 40 daily, metoprolol succinate 50 mg daily - BP goal  <130/80 - monitor and log blood pressures at home - check around the same time each day in a relaxed setting - Limit salt to <2000 mg/day - Follow DASH eating plan (heart healthy diet) - limit alcohol to 2 standard drinks per day for men and 1 per day for women - avoid tobacco products - get at least 2 hours of regular aerobic exercise weekly Patient aware of signs/symptoms requiring further/urgent evaluation. BMP updated today       Relevant Orders   Basic metabolic panel    Return if symptoms worsen or fail to improve, for ; keep regular upcoming appointments .    Clayborne Dana, NP

## 2023-04-23 NOTE — Addendum Note (Signed)
Addended by: Ashok Norris on: 04/23/2023 10:28 AM   Modules accepted: Orders

## 2023-04-23 NOTE — Telephone Encounter (Signed)
Patient stopped to check out and asked about status of prior authorization on patches. Advised that the message was already forwarded to PA team and to allow a little more time for the results.

## 2023-04-23 NOTE — Telephone Encounter (Signed)
Patient scheduled for injection on 05/01/23. Needs pre procedure Valium sent to [facility]

## 2023-04-24 ENCOUNTER — Other Ambulatory Visit: Payer: Self-pay | Admitting: Family

## 2023-04-24 LAB — BASIC METABOLIC PANEL
BUN: 13 mg/dL (ref 6–23)
CO2: 31 meq/L (ref 19–32)
Calcium: 9.7 mg/dL (ref 8.4–10.5)
Chloride: 101 meq/L (ref 96–112)
Creatinine, Ser: 0.73 mg/dL (ref 0.40–1.20)
GFR: 95.05 mL/min (ref >60.00)
Glucose, Bld: 87 mg/dL (ref 70–99)
Potassium: 3.3 meq/L — ABNORMAL LOW (ref 3.5–5.1)
Sodium: 142 meq/L (ref 135–145)

## 2023-04-24 MED ORDER — POTASSIUM CHLORIDE CRYS ER 20 MEQ PO TBCR: 20.0000 meq | EXTENDED_RELEASE_TABLET | Freq: Every day | ORAL | 0 refills | Status: DC

## 2023-04-25 ENCOUNTER — Other Ambulatory Visit (HOSPITAL_COMMUNITY): Payer: Self-pay

## 2023-04-25 NOTE — Telephone Encounter (Signed)
Insurance prefers brand name Transderm-Scop. Called pharmacy to reprocess for brand name. They will have to order for Monday.

## 2023-04-29 ENCOUNTER — Encounter: Payer: Self-pay | Admitting: Physical Therapy

## 2023-04-29 ENCOUNTER — Other Ambulatory Visit (INDEPENDENT_AMBULATORY_CARE_PROVIDER_SITE_OTHER): Payer: Medicaid Other

## 2023-04-29 ENCOUNTER — Ambulatory Visit: Payer: Medicaid Other | Admitting: Physical Therapy

## 2023-04-29 DIAGNOSIS — M5416 Radiculopathy, lumbar region: Secondary | ICD-10-CM

## 2023-04-29 DIAGNOSIS — M6281 Muscle weakness (generalized): Secondary | ICD-10-CM

## 2023-04-29 DIAGNOSIS — M25551 Pain in right hip: Secondary | ICD-10-CM

## 2023-04-29 DIAGNOSIS — E876 Hypokalemia: Secondary | ICD-10-CM

## 2023-04-29 DIAGNOSIS — R252 Cramp and spasm: Secondary | ICD-10-CM

## 2023-04-29 NOTE — Therapy (Signed)
OUTPATIENT PHYSICAL THERAPY THORACOLUMBAR TREATMENT  Patient Name: Lauren Soto MRN: [id number] DOB:[birth year], 52 y.o., female Today's Date: 04/29/2023  END OF SESSION:  PT End of Session - 04/29/23 1455     Visit Number 10    Date for PT Re-Evaluation 05/13/23    Authorization Type [employer]    PT Start Time 1455    PT Stop Time 1545    PT Time Calculation (min) 50 min    Activity Tolerance Patient tolerated treatment well    Behavior During Therapy [facility] for tasks assessed/performed                Past Medical History:  Diagnosis Date   Acute cystitis 02/03/2023   AKI (acute kidney injury) (HCC) 02/02/2023   Asthma 04/21/2014   Chronic right hip pain 11/06/2022   Diabetes mellitus without complication (HCC)    Essential hypertension 05/25/2012   GERD (gastroesophageal reflux disease) 02/03/2023   Hyperlipidemia 11/06/2022   Hypertension    Long term current use of oral hypoglycemic drug 11/06/2022   Menorrhagia 06/08/2012   Non-insulin dependent type 2 diabetes mellitus (HCC) 06/18/2018   Pelvic pain in female 06/08/2012   Postural dizziness with presyncope 02/03/2023   Severe sepsis (HCC) 02/03/2023   Uterine fibroid 05/25/2012   Vertigo    Vestibular migraine 07/06/2018   Past Surgical History:  Procedure Laterality Date   ABDOMINAL HYSTERECTOMY     BUNIONECTOMY     CHOLECYSTECTOMY     HERNIA REPAIR     umblical   SHOULDER ARTHROSCOPY     Patient Active Problem List   Diagnosis Date Noted   Obesity (BMI 35.0-39.9 without comorbidity) 03/19/2023   Diabetes mellitus without complication (HCC)    Hypertension    Vertigo    Severe sepsis (HCC) 02/03/2023   Postural dizziness with presyncope 02/03/2023   GERD (gastroesophageal reflux disease) 02/03/2023   Acute cystitis 02/03/2023   AKI (acute kidney injury) (HCC) 02/02/2023   Long term current use of oral hypoglycemic drug 11/06/2022   Chronic right hip pain 11/06/2022    Hyperlipidemia 11/06/2022   Vestibular migraine 07/06/2018   Non-insulin dependent type 2 diabetes mellitus (HCC) 06/18/2018   Asthma 04/21/2014   Menorrhagia 06/08/2012   Pelvic pain in female 06/08/2012   Essential hypertension 05/25/2012   Uterine fibroid 05/25/2012    PCP: Clayborne Dana, NP   REFERRING PROVIDER: Cammy Copa, MD   REFERRING DIAG: M54.16 (ICD-10-CM) - Lumbar radiculopathy   Rationale for Evaluation and Treatment: Rehabilitation  THERAPY DIAG:  Radiculopathy, lumbar region  Cramp and spasm  Muscle weakness (generalized)  Pain in right hip  ONSET DATE: chronic low back pain years, Leg pain started November 2023.   SUBJECTIVE:  SUBJECTIVE STATEMENT: Little late today due to lab appointment, rechecking low potassium levels.  Injection scheduled on Thursday afternoon (R S1 transforaminal epidural steroid injection).   Getting tingling in R foot and hand.    Eval: Pain has been ongoing for about a year.  Pain is in back, right hip and leg radiating to down R calf, mild pain into groin as well. Had an epidural steroid injection with Dr. Alvester Morin on 01/21/2023, which helped 1-2 days.  Also was given a cortisone injection in her hip on 11/29/22.  It didn't help either.  Pain seems to be spreading and she's not sure if its coming from her back or hip.  Chronic back pain has been going on for years, has been checked for RA and that was ruled out.  The leg pain started in November.   PERTINENT HISTORY:  T2DM, HTN, vertigo & migraines, chronic LBP, history of abdominal hysterectomy and umbilical hernia repair.   PAIN:  Are you having pain? Yes: NPRS scale: 6/10 Pain location: midline low back, radiates up, and down Right leg into groin, aldo buttock down to knee, sometimes to calf.   Pain description: throbbing, achey, sharp in groin Aggravating factors: pain increases past 10/10, increased activity, moving a lot or prolonged sitting Relieving factors: sometimes a heating pad  PRECAUTIONS: None  RED FLAGS: None   WEIGHT BEARING RESTRICTIONS: No  FALLS:  Has patient fallen in last 6 months? No however R knee occasionally buckles/feels weak.   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 Subtle anterolisthesis at L4-L5 with at least moderate facet arthropathy. No spinal stenosis but mild lateral recess and left foraminal stenosis at that level. 2. L5-S1 disc degeneration with a small rightward disc herniation most affecting the right lateral recess. Query right S1 radiculitis. 3. Partially visible lower thoracic disc degeneration with up to mild lower thoracic spinal stenosis. Visible lower spinal cord at those levels seems to remain normal.  11/24/22 MR Pelvis IMPRESSION: Moderate tendinosis with partial tearing of the proximal hamstring tendons bilaterally.   Mild tendinosis of the distal gluteus minimus tendons bilaterally with associated peritrochanteric edema, left greater than right.   Mild osteoarthritis of the hips.  PATIENT SURVEYS:  Modified Oswestry 27/50= 54% severe   COGNITION: Overall cognitive status: Within functional limits for tasks assessed     SENSATION: Not tested  MUSCLE LENGTH: Hamstrings: severe tightness bil  Quadriceps: severe tightness bil   POSTURE:  sits with weight shift to left  PALPATION: Tenderness with PA mob to L3-5, sacrum, bil SIJ tenderness throughout lumbar paraspinals, QL R gluteals,  piriformis and greater trochanter,   LUMBAR ROM:   AROM eval 04/15/2023  Flexion To knees, p! Past knees, p!  Extension Limited 90!, p! Limited 50%, p!  Right lateral flexion To mid thigh P! To knees, p!  Left lateral flexion To mid thigh p! To knee  Right rotation Limited 50% Limited 50%  Left rotation Limited 50% Limited 50%   (Blank rows = not tested)  LOWER EXTREMITY ROM:   good hip IR/ER in R hip but painful, decreased hip IR on L compared to R   LOWER EXTREMITY MMT:    MMT Right eval Left  eval Right 04/15/23 Left 04/15/23  Hip flexion 3+ 4+ 4 5  Hip extension 4- 4-    Hip abduction 4+ 5 5 5   Hip adduction 4+ 4+ 4+ 4+  Knee flexion 4 5 5 5   Knee extension 4+ 5 5 5   Ankle dorsiflexion 5 5    Ankle plantarflexion 5 5     (Blank rows = not tested)  LUMBAR SPECIAL TESTS:  Straight leg raise test: positive on R and cross straight leg, Slump test: Positive, and FABER test: Positive  FUNCTIONAL TESTS:   GAIT: Distance walked: 28' Assistive device utilized: None Level of assistance: Complete Independence Comments: visually slow, no significant deviation but does report intermittent R knee buckling.   TODAY'S TREATMENT:                                                                                                                              DATE:   04/29/23 Therapeutic Exercise: to improve strength and mobility.  Demo, verbal and tactile cues throughout for technique. Nustep L5 x 5 min  Self Care: Review of concerns, education on TENS, placement, care of electrodes, home model set-up and use.  Modalities: TENS to R side glutes + MHP in prone x 15 min to decrease pain and spasm.   04/15/23  Vitals 150/100 MMT Lumbar ROM Review of goals and progress Imaging Review of HEP Discussion of TrDN   04/10/23 Therapeutic Exercise: to improve strength and mobility.  Demo, verbal and tactile cues throughout for technique. Nustep L4 x 6 min  Demo - calf stretches LTR  - starting small gradually increasing amplitude SKTC stretch  Piriformis stretches Windshield wipers Open book stretch Manual Therapy: to decrease muscle spasm and pain and improve mobility R UPA mobs to lumbar spine, IASTM with foam roller to lumbar paraspinals, glutes and QL.    PATIENT EDUCATION:  Education details: HEP update Person educated: Patient Education method: Explanation, Demonstration, Verbal cues, and Handouts Education comprehension: verbalized understanding and returned demonstration  HOME EXERCISE PROGRAM: Access Code: [email or web address]  URL: [email or web address] Date: 04/11/2023 Prepared by: Harrie Foreman  Exercises - Supine Posterior Pelvic Tilt  - 1-2 x daily - 7 x weekly - 1 sets - 10 reps - Supine Bridge  - 2 x daily - 7 x weekly - 1-3 sets - 10 reps - Right Standing Lateral Shift Correction at Wall - Hold  - 2 x daily - 7 x weekly - 1 sets - 10 reps - 5-10 sec hold - Lying Prone with 1 Pillow  - 1 x daily - 3-4 x weekly - 1 sets - 10 reps - Quadruped Leg Lifts  - 1 x daily - 3-4 x weekly - 1-2 sets - 10 reps - Bird Dog  - 1 x daily - 7 x weekly - 1-2 sets - 10 reps - 3-5 sec hold - Plank on Knees  - 1 x daily - 7 x weekly - 1 sets - 5  reps - max hold hold - Standard Plank  - 2 x daily - 7 x weekly - 1 sets - 5 reps - max hold - Seated Gaze Stabilization with Head Rotation  - 3 x daily - 7 x weekly - 3 sets - 10 reps - Seated Gaze Stabilization with Head Nod  - 3 x daily - 7 x weekly - 3 sets - 10 reps - Gastroc Stretch on Wall  - 1 x daily - 7 x weekly - 1 sets - 3 reps - 15 sec hold - Supine Lower Trunk Rotation  - 1 x daily - 7 x weekly - 1 sets - 10 reps - Supine Hip Internal and External Rotation  - 1 x daily - 7 x weekly - 1 sets - 10 reps - Hooklying Single Knee to Chest Stretch  - 1 x daily - 7 x weekly - 1 sets - 10 reps - Supine Figure 4 Piriformis Stretch  - 1 x daily - 7 x weekly - 1 sets - 3 reps - 10sec hold - Sidelying Open Book  Thoracic Lumbar Rotation and Extension  - 1 x daily - 7 x weekly - 1 sets - 10 reps    Pelvic Press     Place hands under belly between navel and pubic bone, palms up. Feel pressure on hands. Increase pressure on hands by pressing pelvis down. This is NOT a pelvic tilt. Hold __5_ seconds. Relax. Repeat _10__ times. Once a day.  KNEE: Flexion - Prone   Hold pelvic press. Bend knee, then return the foot down. Repeat on opposite leg. Do not raise hips. _5-10__ reps per set. When this is mastered, pull both heels up at same time, x 10 reps.  Once a day   Leg Lift: One-Leg   Press pelvis down. Keep knee straight; lengthen and lift one leg (from waist). Do not twist body. Keep other leg down. Hold _2__ seconds. Relax. Repeat 10 time. Repeat with other leg.    ASSESSMENT:  CLINICAL IMPRESSION: Today's session focused on set-up and use of TENS, since Rilya is scheduled for epidural on Thursday and had just arrived after labwork due to low potassium levels.  Educated on inexpensive unit available on [employer] for home use and given purchase information.  Lauren Soto continues to demonstrate potential for improvement and would benefit from continued skilled therapy to address impairments.      OBJECTIVE IMPAIRMENTS: decreased activity tolerance, decreased endurance, decreased mobility, difficulty walking, decreased ROM, decreased strength, increased fascial restrictions, impaired perceived functional ability, increased muscle spasms, impaired flexibility, postural dysfunction, and pain.   ACTIVITY LIMITATIONS: carrying, lifting, bending, sitting, standing, squatting, sleeping, transfers, bed mobility, and locomotion level  PARTICIPATION LIMITATIONS: meal prep, cleaning, laundry, shopping, community activity, and occupation  PERSONAL FACTORS: Time since onset of injury/illness/exacerbation and 3+ comorbidities: T2DM, HTN, vertigo & migraines, chronic LBP, history of abdominal  hysterectomy and umbilical hernia repair.  are also affecting patient's functional outcome.   REHAB POTENTIAL: Good  CLINICAL DECISION MAKING: Evolving/moderate complexity  EVALUATION COMPLEXITY: Moderate   GOALS: Goals reviewed with patient? Yes  SHORT TERM GOALS: Target date: 03/19/2023   Patient will be independent with initial HEP.  Baseline:  Goal status: MET 03/17/23   LONG TERM GOALS: Target date: 04/16/2023  extended to 05/13/2023 Patient will be independent with advanced/ongoing HEP to improve outcomes and carryover.  Baseline:  Goal status: MET 04/15/23- met for current   2.  Patient will report 75% improvement in  low back pain to improve QOL.  Baseline:  Goal status: IN PROGRESS 04/15/23- 20% improvement.   3.  Patient will demonstrate full pain free lumbar ROM to perform ADLs.   Baseline: see objective Goal status: IN PROGRESS  4.  Patient will demonstrate improved functional strength as demonstrated by 4+/5 R hip strength. Baseline: see objective Goal status: IN PROGRESS 04/15/23 see objective  5.  Patient will report at least 6 points improvement on modified Oswestry to demonstrate improved functional ability.  Baseline: 27/50 Goal status: IN PROGRESS   6.  Patient will report being able to sleep without interruption from LBP to improve QOL. Baseline: reports sleeping only 1/4 normal amount due to pain Goal status: NOT MET 04/15/23- no improvement  7.   Patient will report centralization of radicular symptoms.  Baseline: radiates down RLE to calf and groin Goal status: IN PROGRESS 04/08/23- centralized to R buttock now  PLAN:  PT FREQUENCY: 1-2x/week  PT DURATION: 6 weeks extended to 05/13/2023  PLANNED INTERVENTIONS: Therapeutic exercises, Therapeutic activity, Neuromuscular re-education, Balance training, Gait training, Patient/Family education, Self Care, Joint mobilization, Joint manipulation, Orthotic/Fit training, Dry Needling, Electrical  stimulation, Spinal manipulation, Spinal mobilization, Cryotherapy, Moist heat, Taping, Traction, Ultrasound, Manual therapy, and Re-evaluation.  PLAN FOR NEXT SESSION: Continue to progress therex - tolerating mckenzie well, needs more core strengthening, manual and modalities PRN, might consider TrDN in low back, try traction?   Jena Gauss, PT, DPT  04/29/2023, 4:42 PM

## 2023-04-30 ENCOUNTER — Encounter: Payer: Medicaid Other | Admitting: Physical Medicine and Rehabilitation

## 2023-04-30 ENCOUNTER — Other Ambulatory Visit: Payer: Medicaid Other

## 2023-04-30 LAB — BASIC METABOLIC PANEL
BUN: 11 mg/dL (ref 6–23)
CO2: 29 meq/L (ref 19–32)
Calcium: 9.7 mg/dL (ref 8.4–10.5)
Chloride: 102 meq/L (ref 96–112)
Creatinine, Ser: 0.7 mg/dL (ref 0.40–1.20)
GFR: 99.94 mL/min (ref >60.00)
Glucose, Bld: 89 mg/dL (ref 70–99)
Potassium: 3.6 meq/L (ref 3.5–5.1)
Sodium: 141 meq/L (ref 135–145)

## 2023-05-01 ENCOUNTER — Ambulatory Visit: Payer: Medicaid Other | Admitting: Physical Medicine and Rehabilitation

## 2023-05-01 ENCOUNTER — Other Ambulatory Visit: Payer: Self-pay

## 2023-05-01 DIAGNOSIS — M5416 Radiculopathy, lumbar region: Secondary | ICD-10-CM

## 2023-05-01 MED ORDER — METHYLPREDNISOLONE ACETATE 40 MG/ML IJ SUSP
40.0000 mg | Freq: Once | INTRAMUSCULAR | Status: AC
  Administered 2023-05-01: 40 mg

## 2023-05-01 NOTE — Progress Notes (Signed)
Functional Pain Scale - descriptive words and definitions  Immobilizing (10)   Unable to move or talk due to intensity of pain/unable to sleep and unable to use distraction. Severe range order  Average Pain 10  146/105 +Driver, -BT, -Dye Allergies.

## 2023-05-01 NOTE — Patient Instructions (Signed)

## 2023-05-02 NOTE — Procedures (Signed)
S1 Lumbosacral Transforaminal Epidural Steroid Injection - Sub-Pedicular Approach with Fluoroscopic Guidance   Patient: Lauren Soto      Date of Birth: 1970/10/23 MRN: [id number] PCP: Clayborne Dana, NP      Visit Date: 05/01/2023   Universal Protocol:    Date/Time: 11/01/248:11 AM  Consent Given By: the patient  Position:  PRONE  Additional Comments: Vital signs were monitored before and after the procedure. Patient was prepped and draped in the usual sterile fashion. The correct patient, procedure, and site was verified.   Injection Procedure Details:  Procedure Site One Meds Administered:  Meds ordered this encounter  Medications   methylPREDNISolone acetate (DEPO-MEDROL) injection 40 mg    Laterality: Right  Location/Site:  S1 Foramen   Needle size: 22 ga.  Needle type: Spinal  Needle Placement: Transforaminal  Findings:   -Comments: Excellent flow of contrast along the nerve, nerve root and into the epidural space.  Epidurogram: Contrast epidurogram showed no nerve root cut off or restricted flow pattern.  Procedure Details: After squaring off the sacral end-plate to get a true AP view, the C-arm was positioned so that the best possible view of the S1 foramen was visualized. The soft tissues overlying this structure were infiltrated with 2-3 ml. of 1% Lidocaine without Epinephrine.    The spinal needle was inserted toward the target using a "trajectory" view along the fluoroscope beam.  Under AP and lateral visualization, the needle was advanced so it did not puncture dura. Biplanar projections were used to confirm position. Aspiration was confirmed to be negative for CSF and/or blood. A 1-2 ml. volume of Isovue-250 was injected and flow of contrast was noted at each level. Radiographs were obtained for documentation purposes.   After attaining the desired flow of contrast documented above, a 0.5 to 1.0 ml test dose of 0.25% Marcaine was injected into  each respective transforaminal space.  The patient was observed for 90 seconds post injection.  After no sensory deficits were reported, and normal lower extremity motor function was noted,   the above injectate was administered so that equal amounts of the injectate were placed at each foramen (level) into the transforaminal epidural space.   Additional Comments:  No complications occurred Dressing: Band-Aid with 2 x 2 sterile gauze    Post-procedure details: Patient was observed during the procedure. Post-procedure instructions were reviewed.  Patient left the clinic in stable condition.

## 2023-05-02 NOTE — Telephone Encounter (Signed)
Pt called to check on PA statu. Pt stated the brand name is not available for this medication and a PA would have to be placed or another medication may have to be looked at entirely.

## 2023-05-02 NOTE — Progress Notes (Signed)
Lauren Soto - 52 y.o. female MRN [id number]  Date of birth: [birth year]  Office Visit Note: Visit Date: 05/01/2023 PCP: Clayborne Dana, NP Referred by: Clayborne Dana, NP  Subjective: Chief Complaint  Patient presents with   Lower Back - Pain   HPI:  Lauren Soto is a 52 y.o. female who comes in today at the request of Ellin Goodie, FNP for planned Right S1-2 Lumbar Transforaminal epidural steroid injection with fluoroscopic guidance.  The patient has failed conservative care including home exercise, medications, time and activity modification.  This injection will be diagnostic and hopefully therapeutic.  Please see requesting physician notes for further details and justification.   ROS Otherwise per HPI.  Assessment & Plan: Visit Diagnoses:    ICD-10-CM   1. Lumbar radiculopathy  M54.16 XR C-ARM NO REPORT    Epidural Steroid injection    methylPREDNISolone acetate (DEPO-MEDROL) injection 40 mg      Plan: No additional findings.   Meds & Orders:  Meds ordered this encounter  Medications   methylPREDNISolone acetate (DEPO-MEDROL) injection 40 mg    Orders Placed This Encounter  Procedures   XR C-ARM NO REPORT   Epidural Steroid injection    Follow-up: Return for visit to requesting provider as needed.   Procedures: No procedures performed  S1 Lumbosacral Transforaminal Epidural Steroid Injection - Sub-Pedicular Approach with Fluoroscopic Guidance   Patient: Lauren Soto      Date of Birth: [birth year] MRN: [id number] PCP: Clayborne Dana, NP      Visit Date: 05/01/2023   Universal Protocol:    Date/Time: 11/01/248:11 AM  Consent Given By: the patient  Position:  PRONE  Additional Comments: Vital signs were monitored before and after the procedure. Patient was prepped and draped in the usual sterile fashion. The correct patient, procedure, and site was verified.   Injection Procedure Details:  Procedure Site One Meds  Administered:  Meds ordered this encounter  Medications   methylPREDNISolone acetate (DEPO-MEDROL) injection 40 mg    Laterality: Right  Location/Site:  S1 Foramen   Needle size: 22 ga.  Needle type: Spinal  Needle Placement: Transforaminal  Findings:   -Comments: Excellent flow of contrast along the nerve, nerve root and into the epidural space.  Epidurogram: Contrast epidurogram showed no nerve root cut off or restricted flow pattern.  Procedure Details: After squaring off the sacral end-plate to get a true AP view, the C-arm was positioned so that the best possible view of the S1 foramen was visualized. The soft tissues overlying this structure were infiltrated with 2-3 ml. of 1% Lidocaine without Epinephrine.    The spinal needle was inserted toward the target using a "trajectory" view along the fluoroscope beam.  Under AP and lateral visualization, the needle was advanced so it did not puncture dura. Biplanar projections were used to confirm position. Aspiration was confirmed to be negative for CSF and/or blood. A 1-2 ml. volume of Isovue-250 was injected and flow of contrast was noted at each level. Radiographs were obtained for documentation purposes.   After attaining the desired flow of contrast documented above, a 0.5 to 1.0 ml test dose of 0.25% Marcaine was injected into each respective transforaminal space.  The patient was observed for 90 seconds post injection.  After no sensory deficits were reported, and normal lower extremity motor function was noted,   the above injectate was administered so that equal amounts of the injectate were placed at each foramen (level) into the  transforaminal epidural space.   Additional Comments:  No complications occurred Dressing: Band-Aid with 2 x 2 sterile gauze    Post-procedure details: Patient was observed during the procedure. Post-procedure instructions were reviewed.  Patient left the clinic in stable condition.    Clinical History: MRI LUMBAR SPINE WITHOUT CONTRAST   TECHNIQUE: Multiplanar, multisequence MR imaging of the lumbar spine was performed. No intravenous contrast was administered.   COMPARISON:  Lumbar radiographs 11/18/2022.   FINDINGS: Segmentation:  Normal on the comparison.   Alignment: Normal to mildly exaggerated lumbar lordosis. Subtle grade 1 anterolisthesis of L4 on L5 (only 2 mm on these images) was more conspicuous on the radiographs. No significant scoliosis.   Vertebrae: Visualized bone marrow signal is within normal limits. No marrow edema or evidence of acute osseous abnormality. Intact visible sacrum.   Conus medullaris and cauda equina: Conus extends to the L1 level. No lower spinal cord or conus signal abnormality.   Paraspinal and other soft tissues: Negative; large body habitus.   Disc levels:   Multilevel lower thoracic disc desiccation and disc bulging is partially visible (series 3, image 9). Up to mild lower thoracic spinal stenosis at the level of the lower spinal cord (T11-T12:), but no definite cord mass effect or signal abnormality.   T12-L1:  Negative.   L1-L2:  Negative.   L2-L3:  Negative disc.  Mild facet hypertrophy.  No stenosis.   L3-L4: Negative disc. Mild right foraminal disc bulge and endplate spurring. Otherwise negative disc. Mild to moderate facet hypertrophy. Borderline to mild right L3 foraminal stenosis.   L4-L5: Subtle anterolisthesis. Moderate facet and ligament flavum hypertrophy. Degenerative facet joint fluid. No spinal stenosis. Up to mild lateral recess stenosis (L5 nerve levels) and left L4 neural foraminal stenosis.   L5-S1: Subtle disc desiccation. Broad-based central to right paracentral disc protrusion (series 6, image 38 and series 3, image 8). Mild facet hypertrophy. No spinal stenosis. Mild mass effect on the descending right S1 nerve roots in the lateral recess. Borderline to mild bilateral L5 foraminal  stenosis.   IMPRESSION: 1. Subtle anterolisthesis at L4-L5 with at least moderate facet arthropathy. No spinal stenosis but mild lateral recess and left foraminal stenosis at that level. 2. L5-S1 disc degeneration with a small rightward disc herniation most affecting the right lateral recess. Query right S1 radiculitis. 3. Partially visible lower thoracic disc degeneration with up to mild lower thoracic spinal stenosis. Visible lower spinal cord at those levels seems to remain normal.     Electronically Signed   By: Odessa Fleming M.D.     Objective:  VS:  HT:    WT:   BMI:     BP:   HR: bpm  TEMP: ( )  RESP:  Physical Exam Vitals and nursing note reviewed.  Constitutional:      General: She is not in acute distress.    Appearance: Normal appearance. She is not ill-appearing.  HENT:     Head: Normocephalic and atraumatic.     Right Ear: External ear normal.     Left Ear: External ear normal.  Eyes:     Extraocular Movements: Extraocular movements intact.  Cardiovascular:     Rate and Rhythm: Normal rate.     Pulses: Normal pulses.  Pulmonary:     Effort: Pulmonary effort is normal. No respiratory distress.  Abdominal:     General: There is no distension.     Palpations: Abdomen is soft.  Musculoskeletal:  General: Tenderness present.     Cervical back: Neck supple.     Right lower leg: No edema.     Left lower leg: No edema.     Comments: Patient has good distal strength with no pain over the greater trochanters.  No clonus or focal weakness.  Skin:    Findings: No erythema, lesion or rash.  Neurological:     General: No focal deficit present.     Mental Status: She is alert and oriented to person, place, and time.     Sensory: No sensory deficit.     Motor: No weakness or abnormal muscle tone.     Coordination: Coordination normal.  Psychiatric:        Mood and Affect: Mood normal.        Behavior: Behavior normal.      Imaging: XR C-ARM NO  REPORT  Result Date: 05/01/2023 Please see Notes tab for imaging impression.

## 2023-05-05 ENCOUNTER — Telehealth: Payer: Self-pay | Admitting: Family Medicine

## 2023-05-05 NOTE — Telephone Encounter (Signed)
LVM letting patient know:   Insurance won't cover generic patches  Brand name on manufacturer back order  Alternative is Meclizine (which she already has)  Can take Meclizine or pay cash price for Scopolamine (use [employer] coupon).   To call with questions.

## 2023-05-05 NOTE — Telephone Encounter (Signed)
Pt called stating that she has been trying to get in contact with [facility] for the past few weeks to get scheduled for an appt but has not had any luck. Pt stated that she has left about 4 VMs over this time period and has not received a call back yet. Pt would like to look into rerouting this referral to [facility] if possible. Please Advise.

## 2023-05-05 NOTE — Telephone Encounter (Signed)
Called [facility]. Scopolamine is on manufacturer backorder and they don't know when it will be available. Copy. Alternative?

## 2023-05-06 ENCOUNTER — Other Ambulatory Visit: Payer: Self-pay | Admitting: Family Medicine

## 2023-05-06 ENCOUNTER — Ambulatory Visit: Payer: Medicaid Other | Attending: Orthopedic Surgery | Admitting: Physical Therapy

## 2023-05-06 ENCOUNTER — Encounter: Payer: Self-pay | Admitting: Physical Therapy

## 2023-05-06 DIAGNOSIS — N649 Disorder of breast, unspecified: Secondary | ICD-10-CM

## 2023-05-06 DIAGNOSIS — M6281 Muscle weakness (generalized): Secondary | ICD-10-CM

## 2023-05-06 DIAGNOSIS — R252 Cramp and spasm: Secondary | ICD-10-CM

## 2023-05-06 DIAGNOSIS — M25551 Pain in right hip: Secondary | ICD-10-CM

## 2023-05-06 DIAGNOSIS — M5416 Radiculopathy, lumbar region: Secondary | ICD-10-CM | POA: Diagnosis present

## 2023-05-06 NOTE — Telephone Encounter (Signed)
Pt was called and advised.

## 2023-05-06 NOTE — Therapy (Signed)
OUTPATIENT PHYSICAL THERAPY THORACOLUMBAR TREATMENT/Discharge Summary  Patient Name: Lauren Soto MRN: [id number] DOB:06-07-1971, 52 y.o., female Today's Date: 05/06/2023  END OF SESSION:  PT End of Session - 05/06/23 1151     Visit Number 11    Date for PT Re-Evaluation 05/13/23    Authorization Type [employer] Medicaid    PT Start Time 1153    PT Stop Time 1233    PT Time Calculation (min) 40 min    Activity Tolerance Patient tolerated treatment well    Behavior During Therapy [facility] for tasks assessed/performed                Past Medical History:  Diagnosis Date   Acute cystitis 02/03/2023   AKI (acute kidney injury) (HCC) 02/02/2023   Asthma 04/21/2014   Chronic right hip pain 11/06/2022   Diabetes mellitus without complication (HCC)    Essential hypertension 05/25/2012   GERD (gastroesophageal reflux disease) 02/03/2023   Hyperlipidemia 11/06/2022   Hypertension    Long term current use of oral hypoglycemic drug 11/06/2022   Menorrhagia 06/08/2012   Non-insulin dependent type 2 diabetes mellitus (HCC) 06/18/2018   Pelvic pain in female 06/08/2012   Postural dizziness with presyncope 02/03/2023   Severe sepsis (HCC) 02/03/2023   Uterine fibroid 05/25/2012   Vertigo    Vestibular migraine 07/06/2018   Past Surgical History:  Procedure Laterality Date   ABDOMINAL HYSTERECTOMY     BUNIONECTOMY     CHOLECYSTECTOMY     HERNIA REPAIR     umblical   SHOULDER ARTHROSCOPY     Patient Active Problem List   Diagnosis Date Noted   Obesity (BMI 35.0-39.9 without comorbidity) 03/19/2023   Diabetes mellitus without complication (HCC)    Hypertension    Vertigo    Severe sepsis (HCC) 02/03/2023   Postural dizziness with presyncope 02/03/2023   GERD (gastroesophageal reflux disease) 02/03/2023   Acute cystitis 02/03/2023   AKI (acute kidney injury) (HCC) 02/02/2023   Long term current use of oral hypoglycemic drug 11/06/2022   Chronic right hip pain 11/06/2022    Hyperlipidemia 11/06/2022   Vestibular migraine 07/06/2018   Non-insulin dependent type 2 diabetes mellitus (HCC) 06/18/2018   Asthma 04/21/2014   Menorrhagia 06/08/2012   Pelvic pain in female 06/08/2012   Essential hypertension 05/25/2012   Uterine fibroid 05/25/2012    PCP: Clayborne Dana, NP   REFERRING PROVIDER: Cammy Copa, MD   REFERRING DIAG: M54.16 (ICD-10-CM) - Lumbar radiculopathy   Rationale for Evaluation and Treatment: Rehabilitation  THERAPY DIAG:  Radiculopathy, lumbar region  Cramp and spasm  Muscle weakness (generalized)  Pain in right hip  ONSET DATE: chronic low back pain years, Leg pain started November 2023.   SUBJECTIVE:  SUBJECTIVE STATEMENT: Brought daughter today to see how to set up TENS on back.  Had her epidural on Thursday, still hurting a little in the hip and some cramping in the leg, told it would cramp for 3 days, may be having been off enough.    PERTINENT HISTORY:  T2DM, HTN, vertigo & migraines, chronic LBP, history of abdominal hysterectomy and umbilical hernia repair.   PAIN:  Are you having pain? Yes: NPRS scale: 5/10 Pain location: midline low back, radiates up, and down Right leg into groin, aldo buttock down to knee, sometimes to calf.  Pain description: throbbing, achey, sharp in groin Aggravating factors: pain increases past 10/10, increased activity, moving a lot or prolonged sitting Relieving factors: sometimes a heating pad  PRECAUTIONS: None  RED FLAGS: None   WEIGHT BEARING RESTRICTIONS: No  FALLS:  Has patient fallen in last 6 months? No however R knee occasionally buckles/feels weak.   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 Subtle anterolisthesis at L4-L5 with at least moderate facet arthropathy. No spinal stenosis but mild lateral recess and left foraminal stenosis at that level. 2. L5-S1 disc degeneration with a small rightward disc herniation most affecting the right lateral recess. Query right S1 radiculitis. 3. Partially visible lower thoracic disc degeneration with up to mild lower thoracic spinal stenosis. Visible lower spinal cord at those levels seems to remain normal.  11/24/22 MR Pelvis IMPRESSION: Moderate tendinosis with partial tearing of the proximal hamstring tendons bilaterally.   Mild tendinosis of the distal gluteus minimus tendons bilaterally with associated peritrochanteric edema, left greater than right.   Mild osteoarthritis of the hips.  PATIENT SURVEYS:  Modified Oswestry 27/50= 54% severe   COGNITION: Overall cognitive status: Within functional limits for tasks assessed     SENSATION: Not tested  MUSCLE LENGTH: Hamstrings: severe tightness bil  Quadriceps: severe tightness bil   POSTURE:  sits with weight shift to left  PALPATION: Tenderness with PA mob to L3-5, sacrum, bil SIJ tenderness throughout lumbar paraspinals, QL R gluteals, piriformis and greater trochanter,   LUMBAR ROM:   AROM eval 04/15/2023  Flexion To knees, p! Past knees, p!  Extension Limited 90!, p! Limited 50%, p!  Right lateral flexion To mid thigh P! To knees, p!  Left lateral flexion To mid thigh p! To knee  Right rotation Limited 50% Limited 50%  Left rotation Limited 50% Limited 50%   (Blank rows = not tested)  LOWER EXTREMITY ROM:   good hip IR/ER in R hip but painful, decreased hip IR on L compared to R   LOWER EXTREMITY MMT:    MMT Right eval  Left eval Right 04/15/23 Left 04/15/23  Hip flexion 3+ 4+ 4 5  Hip extension 4- 4-    Hip abduction 4+ 5 5 5   Hip adduction 4+ 4+ 4+ 4+  Knee flexion 4 5 5 5   Knee extension 4+ 5 5 5   Ankle dorsiflexion 5 5    Ankle plantarflexion 5 5     (Blank rows = not tested)  LUMBAR SPECIAL TESTS:  Straight leg raise test: positive on R and cross straight leg, Slump test: Positive, and FABER test: Positive  FUNCTIONAL TESTS:  GAIT: Distance walked: 49' Assistive device utilized: None Level of assistance: Complete Independence Comments: visually slow, no significant deviation but does report intermittent R knee buckling.   TODAY'S TREATMENT:                                                                                                                              DATE:   05/06/2023 Therapeutic Exercise: to improve strength and mobility.  Demo, verbal and tactile cues throughout for technique. Nustep L5 x 5 min  Review HEP - including VOR exercises Self Care: Review of set up of TENS and electrode placement, with daughter present Manual Therapy: to decrease muscle spasm and pain and improve mobility IASTM with foam roller to R glutes, lumbar paraspinals, R UPA mobs.   04/29/23 Therapeutic Exercise: to improve strength and mobility.  Demo, verbal and tactile cues throughout for technique. Nustep L5 x 5 min  Self Care: Review of concerns, education on TENS, placement, care of electrodes, home model set-up and use.  Modalities: TENS to R side glutes + MHP in prone x 15 min to decrease pain and spasm.   04/15/23  Vitals 150/100 MMT Lumbar ROM Review of goals and progress Imaging Review of HEP Discussion of TrDN   04/10/23 Therapeutic Exercise: to improve strength and mobility.  Demo, verbal and tactile cues throughout for technique. Nustep L4 x 6 min  Demo - calf stretches LTR - starting small gradually increasing amplitude SKTC stretch  Piriformis stretches Windshield  wipers Open book stretch Manual Therapy: to decrease muscle spasm and pain and improve mobility R UPA mobs to lumbar spine, IASTM with foam roller to lumbar paraspinals, glutes and QL.    PATIENT EDUCATION:  Education details: HEP update Person educated: Patient Education method: Explanation, Demonstration, Verbal cues, and Handouts Education comprehension: verbalized understanding and returned demonstration  HOME EXERCISE PROGRAM: Access Code: [email or web address]  URL: [email or web address] Date: 04/11/2023 Prepared by: Harrie Foreman  Exercises - Supine Posterior Pelvic Tilt  - 1-2 x daily - 7 x weekly - 1 sets - 10 reps - Supine Bridge  - 2 x daily - 7 x weekly - 1-3 sets - 10 reps - Right Standing Lateral Shift Correction at Wall - Hold  - 2 x daily - 7 x weekly - 1 sets - 10 reps - 5-10 sec hold - Lying Prone with 1 Pillow  - 1 x daily - 3-4 x weekly - 1 sets - 10 reps - Quadruped Leg Lifts  - 1 x daily - 3-4 x weekly - 1-2 sets - 10 reps - Bird Dog  - 1 x daily - 7 x weekly - 1-2 sets - 10 reps - 3-5 sec hold - Plank on Knees  - 1 x daily - 7 x weekly - 1 sets - 5 reps - max hold hold - Standard Plank  - 2 x daily - 7 x weekly - 1 sets - 5 reps - max hold - Seated  Gaze Stabilization with Head Rotation  - 3 x daily - 7 x weekly - 3 sets - 10 reps - Seated Gaze Stabilization with Head Nod  - 3 x daily - 7 x weekly - 3 sets - 10 reps - Gastroc Stretch on Wall  - 1 x daily - 7 x weekly - 1 sets - 3 reps - 15 sec hold - Supine Lower Trunk Rotation  - 1 x daily - 7 x weekly - 1 sets - 10 reps - Supine Hip Internal and External Rotation  - 1 x daily - 7 x weekly - 1 sets - 10 reps - Hooklying Single Knee to Chest Stretch  - 1 x daily - 7 x weekly - 1 sets - 10 reps - Supine Figure 4 Piriformis Stretch  - 1 x daily - 7 x weekly - 1 sets - 3 reps - 10sec hold - Sidelying Open Book Thoracic Lumbar Rotation and Extension  - 1 x daily - 7 x weekly - 1 sets - 10 reps    Pelvic  Press     Place hands under belly between navel and pubic bone, palms up. Feel pressure on hands. Increase pressure on hands by pressing pelvis down. This is NOT a pelvic tilt. Hold __5_ seconds. Relax. Repeat _10__ times. Once a day.  KNEE: Flexion - Prone   Hold pelvic press. Bend knee, then return the foot down. Repeat on opposite leg. Do not raise hips. _5-10__ reps per set. When this is mastered, pull both heels up at same time, x 10 reps.  Once a day   Leg Lift: One-Leg   Press pelvis down. Keep knee straight; lengthen and lift one leg (from waist). Do not twist body. Keep other leg down. Hold _2__ seconds. Relax. Repeat 10 time. Repeat with other leg.    ASSESSMENT:  CLINICAL IMPRESSION: K[place]i has purchased TENS and accompanied by daughter today, so reviewed set up and placement of pads so daughter could assist.  She had an epidural on Thursday, reported that when she had injections she did have same symptoms going down her leg.  She still has heaviness and cramping in her R thigh, R hip flexor still weak (3+/5).  She understands still may take some time for effects of the epidural.  She is compliant with HEP and had no questions today.  She is sleeping better now.  Agreeable to discharge from PT today.       OBJECTIVE IMPAIRMENTS: decreased activity tolerance, decreased endurance, decreased mobility, difficulty walking, decreased ROM, decreased strength, increased fascial restrictions, impaired perceived functional ability, increased muscle spasms, impaired flexibility, postural dysfunction, and pain.   ACTIVITY LIMITATIONS: carrying, lifting, bending, sitting, standing, squatting, sleeping, transfers, bed mobility, and locomotion level  PARTICIPATION LIMITATIONS: meal prep, cleaning, laundry, shopping, community activity, and occupation  PERSONAL FACTORS: Time since onset of injury/illness/exacerbation and 3+ comorbidities: T2DM, HTN, vertigo & migraines, chronic LBP,  history of abdominal hysterectomy and umbilical hernia repair.  are also affecting patient's functional outcome.   REHAB POTENTIAL: Good  CLINICAL DECISION MAKING: Evolving/moderate complexity  EVALUATION COMPLEXITY: Moderate   GOALS: Goals reviewed with patient? Yes  SHORT TERM GOALS: Target date: 03/19/2023   Patient will be independent with initial HEP.  Baseline:  Goal status: MET 03/17/23   LONG TERM GOALS: Target date: 04/16/2023  extended to 05/13/2023 Patient will be independent with advanced/ongoing HEP to improve outcomes and carryover.  Baseline:  Goal status: MET 04/15/23- met for current   2.  Patient will report 75% improvement in low back pain to improve QOL.  Baseline:  Goal status: IN PROGRESS 04/15/23- 20% improvement.  05/06/2023 - 20%  3.  Patient will demonstrate full pain free lumbar ROM to perform ADLs.   Baseline: see objective Goal status: IN PROGRESS  4.  Patient will demonstrate improved functional strength as demonstrated by 4+/5 R hip strength. Baseline: see objective Goal status: IN PROGRESS 04/15/23 see objective  05/06/2023 - 3+/5 R hip strength  5.  Patient will report at least 6 points improvement on modified Oswestry to demonstrate improved functional ability.  Baseline: 27/50 Goal status: IN PROGRESS   6.  Patient will report being able to sleep without interruption from LBP to improve QOL. Baseline: reports sleeping only 1/4 normal amount due to pain Goal status: MET 04/15/23- no improvement 05/06/23- sleeping through the night now.    7.   Patient will report centralization of radicular symptoms.  Baseline: radiates down RLE to calf and groin Goal status: IN PROGRESS 04/08/23- centralized to R buttock now 11/5/4- cramping in R thigh since epidural  PLAN:  PT FREQUENCY: 1-2x/week  PT DURATION: 6 weeks extended to 05/13/2023  PLANNED INTERVENTIONS: Therapeutic exercises, Therapeutic activity, Neuromuscular re-education, Balance  training, Gait training, Patient/Family education, Self Care, Joint mobilization, Joint manipulation, Orthotic/Fit training, Dry Needling, Electrical stimulation, Spinal manipulation, Spinal mobilization, Cryotherapy, Moist heat, Taping, Traction, Ultrasound, Manual therapy, and Re-evaluation.  PLAN FOR NEXT SESSION: discharge to HEP   Jena Gauss, PT, DPT  05/06/2023, 3:43 PM    PHYSICAL THERAPY DISCHARGE SUMMARY  Visits from Start of Care: 11  Current functional level related to goals / functional outcomes: 20% improvement in LB pain, improved sleep   Remaining deficits: Continued LBP, R LE weakness "heaviness"   Education / Equipment: HEP, TENS  Plan: Patient agrees to discharge.  Patient goals were not met.     Jena Gauss, PT  05/06/2023 3:48 PM

## 2023-05-16 ENCOUNTER — Other Ambulatory Visit (HOSPITAL_COMMUNITY): Payer: Self-pay

## 2023-05-19 ENCOUNTER — Other Ambulatory Visit (HOSPITAL_COMMUNITY): Payer: Self-pay

## 2023-05-19 NOTE — Telephone Encounter (Signed)
Appeal was approved. Med was filled in October and again 11/16. I requested a copy of the approval letter, since it appears we never received it. I'll index it as soon as I see it.   Approved through 07/19/23

## 2023-05-26 ENCOUNTER — Ambulatory Visit
Admission: RE | Admit: 2023-05-26 | Discharge: 2023-05-26 | Disposition: A | Discharge status: Home / Self Care | Payer: Medicaid Other | Source: Ambulatory Visit | Attending: Family Medicine | Admitting: Family Medicine

## 2023-05-26 DIAGNOSIS — N644 Mastodynia: Secondary | ICD-10-CM | POA: Diagnosis not present

## 2023-05-26 DIAGNOSIS — N6310 Unspecified lump in the right breast, unspecified quadrant: Secondary | ICD-10-CM | POA: Diagnosis not present

## 2023-05-26 DIAGNOSIS — N649 Disorder of breast, unspecified: Secondary | ICD-10-CM

## 2023-05-30 ENCOUNTER — Other Ambulatory Visit: Payer: Self-pay | Admitting: Orthopedic Surgery

## 2023-06-02 ENCOUNTER — Ambulatory Visit: Payer: Medicaid Other | Admitting: Family Medicine

## 2023-06-03 ENCOUNTER — Telehealth: Payer: Self-pay | Admitting: Orthopedic Surgery

## 2023-06-03 NOTE — Telephone Encounter (Signed)
Patient called and said that the shot didn't work and she is in pain. She also need a refill on pregamblin. CB#[phone]

## 2023-06-04 ENCOUNTER — Encounter (HOSPITAL_BASED_OUTPATIENT_CLINIC_OR_DEPARTMENT_OTHER): Payer: Self-pay

## 2023-06-04 ENCOUNTER — Encounter: Payer: Self-pay | Admitting: Family Medicine

## 2023-06-04 ENCOUNTER — Telehealth: Payer: Self-pay | Admitting: Surgical

## 2023-06-04 ENCOUNTER — Ambulatory Visit: Payer: Medicaid Other | Admitting: Family Medicine

## 2023-06-04 ENCOUNTER — Other Ambulatory Visit: Payer: Self-pay | Admitting: Surgical

## 2023-06-04 VITALS — BP 132/89 | HR 66 | Ht 60.0 in | Wt 195.0 lb

## 2023-06-04 DIAGNOSIS — Z7984 Long term (current) use of oral hypoglycemic drugs: Secondary | ICD-10-CM

## 2023-06-04 DIAGNOSIS — M25551 Pain in right hip: Secondary | ICD-10-CM | POA: Diagnosis not present

## 2023-06-04 DIAGNOSIS — G8929 Other chronic pain: Secondary | ICD-10-CM | POA: Diagnosis not present

## 2023-06-04 DIAGNOSIS — E119 Type 2 diabetes mellitus without complications: Secondary | ICD-10-CM

## 2023-06-04 DIAGNOSIS — R748 Abnormal levels of other serum enzymes: Secondary | ICD-10-CM | POA: Diagnosis not present

## 2023-06-04 DIAGNOSIS — I10 Essential (primary) hypertension: Secondary | ICD-10-CM | POA: Diagnosis not present

## 2023-06-04 DIAGNOSIS — E785 Hyperlipidemia, unspecified: Secondary | ICD-10-CM | POA: Diagnosis not present

## 2023-06-04 DIAGNOSIS — M5416 Radiculopathy, lumbar region: Secondary | ICD-10-CM

## 2023-06-04 MED ORDER — MELOXICAM 15 MG PO TABS: 15.0000 mg | ORAL_TABLET | Freq: Every day | ORAL | 1 refills | Status: DC

## 2023-06-04 MED ORDER — METOPROLOL SUCCINATE ER 50 MG PO TB24: 50.0000 mg | ORAL_TABLET | Freq: Every day | ORAL | 1 refills | Status: AC

## 2023-06-04 MED ORDER — LISINOPRIL 40 MG PO TABS: 40.0000 mg | ORAL_TABLET | Freq: Every day | ORAL | 1 refills | Status: AC

## 2023-06-04 MED ORDER — PREGABALIN 100 MG PO CAPS: 100.0000 mg | ORAL_CAPSULE | Freq: Every day | ORAL | 0 refills | Status: DC

## 2023-06-04 MED ORDER — HYDROCHLOROTHIAZIDE 12.5 MG PO TABS: 12.5000 mg | ORAL_TABLET | Freq: Every day | ORAL | 1 refills | Status: AC

## 2023-06-04 MED ORDER — METFORMIN HCL 850 MG PO TABS: 850.0000 mg | ORAL_TABLET | Freq: Two times a day (BID) | ORAL | 1 refills | Status: AC

## 2023-06-04 MED ORDER — ATORVASTATIN CALCIUM 20 MG PO TABS: 20.0000 mg | ORAL_TABLET | Freq: Every day | ORAL | 1 refills | Status: AC

## 2023-06-04 NOTE — Assessment & Plan Note (Signed)
Following with Ortho

## 2023-06-04 NOTE — Assessment & Plan Note (Signed)
Medication management: Lipitor 20 mg daily Lifestyle factors for lowering cholesterol include: Diet therapy - heart-healthy diet rich in fruits, veggies, fiber-rich whole grains, lean meats, chicken, fish (at least twice a week), fat-free or 1% dairy products; foods low in saturated/trans fats, cholesterol, sodium, and sugar. Mediterranean diet has shown to be very heart healthy. Regular exercise - recommend at least 30 minutes a day, 5 times per week Weight management

## 2023-06-04 NOTE — Telephone Encounter (Signed)
Lvm for pt to cb to discuss  

## 2023-06-04 NOTE — Telephone Encounter (Signed)
Pt called to answer Autumn H questions. Pt states she had no relief from last injection and asking for a call back. Pt phone number is [phone].

## 2023-06-04 NOTE — Assessment & Plan Note (Addendum)
Blood pressure is at goal for age and co-morbidities.   Recommendations: hctz 12.5 mg daily, lisinopril 40 daily, metoprolol succinate 50 mg daily - BP goal <130/80 - monitor and log blood pressures at home - check around the same time each day in a relaxed setting - Limit salt to <2000 mg/day - Follow DASH eating plan (heart healthy diet) - limit alcohol to 2 standard drinks per day for men and 1 per day for women - avoid tobacco products - get at least 2 hours of regular aerobic exercise weekly Patient aware of signs/symptoms requiring further/urgent evaluation.

## 2023-06-04 NOTE — Telephone Encounter (Signed)
She had L-spine ESI on 10/31, need to know: did it provide any relief for any amount of time (few days or weeks afterward)? I can refill pregabalin

## 2023-06-04 NOTE — Progress Notes (Signed)
Established Patient Office Visit  Subjective   Patient ID: Lauren Soto, female    DOB: May 22, 1971  Age: 52 y.o. MRN: [id number]  CC: HTN follow-up   HPI  Patient is here for chronic disease follow-up.     Hypertension: - Medications: hctz 12.5 mg daily, lisinopril 40 mg daily, metoprolol succinate 50 mg daily - Compliance: good - Checking BP at home: no - Denies any SOB, recurrent headaches, CP, vision changes, LE edema, dizziness, palpitations, or medication side effects. - Diet: heart healthy - Exercise: walking   BP Readings from Last 3 Encounters:  06/04/23 132/89  04/23/23 133/75  04/08/23 (!) 160/95   Wt Readings from Last 3 Encounters:  06/04/23 195 lb (88.5 kg)  04/23/23 196 lb 12.8 oz (89.3 kg)  04/08/23 195 lb 6 oz (88.6 kg)    Diabetes: - Checking glucose at home: no - Medications: metformin 850 mg BID (only taking once a day) - Compliance: fair - Diet: low carb, heart healthy - Exercise: minimal - Eye exam: patient to schedule - Foot exam: UTD - Microalbumin: UTD - Denies symptoms of hypoglycemia, polyuria, polydipsia, numbness extremities, foot ulcers/trauma, wounds that are not healing, medication side effects   Lab Results  Component Value Date   HGBA1C 7.3 (H) 02/03/2023     Hyperlipidemia: - medications: Lipitor 20 mg daily - compliance: good - medication SEs: none The 10-year ASCVD risk score (Arnett DK, et al., 2019) is: 8.6%   Values used to calculate the score:     Age: 86 years     Sex: Female     Is Non-Hispanic African American: Yes     Diabetic: Yes     Tobacco smoker: No     Systolic Blood Pressure: 132 mmHg     Is BP treated: Yes     HDL Cholesterol: 51 mg/dL     Total Cholesterol: 157 mg/dL   Migraines: - Following with Neurology - Recently started Ajovy   Chronic low back and right hip pain: - Following with ortho and PT - Epidural steroid injection in October (right S1-2)   Last labs with [place] had  elevated alk phos, will repeat and evaluate today.      ROS All review of systems negative except what is listed in the HPI    Objective:     BP 132/89   Pulse 66   Ht 5' (1.524 m)   Wt 195 lb (88.5 kg)   LMP 06/04/2012   SpO2 100%   BMI 38.08 kg/m    Physical Exam Vitals reviewed.  Constitutional:      Appearance: Normal appearance. She is obese.  Cardiovascular:     Rate and Rhythm: Normal rate and regular rhythm.     Heart sounds: Normal heart sounds.  Pulmonary:     Effort: Pulmonary effort is normal.     Breath sounds: Normal breath sounds.  Skin:    General: Skin is warm and dry.  Neurological:     Mental Status: She is alert and oriented to person, place, and time.  Psychiatric:        Mood and Affect: Mood normal.        Behavior: Behavior normal.        Thought Content: Thought content normal.        Judgment: Judgment normal.      No results found for any visits on 06/04/23.    The 10-year ASCVD risk score (Arnett DK, et al., 2019) is:  8.6%    Assessment & Plan:   Problem List Items Addressed This Visit       Active Problems   Essential hypertension - Primary (Chronic)    Blood pressure is at goal for age and co-morbidities.   Recommendations: hctz 12.5 mg daily, lisinopril 40 daily, metoprolol succinate 50 mg daily - BP goal <130/80 - monitor and log blood pressures at home - check around the same time each day in a relaxed setting - Limit salt to <2000 mg/day - Follow DASH eating plan (heart healthy diet) - limit alcohol to 2 standard drinks per day for men and 1 per day for women - avoid tobacco products - get at least 2 hours of regular aerobic exercise weekly Patient aware of signs/symptoms requiring further/urgent evaluation.       Relevant Medications   atorvastatin (LIPITOR) 20 MG tablet   hydrochlorothiazide (HYDRODIURIL) 12.5 MG tablet   lisinopril (ZESTRIL) 40 MG tablet   metoprolol succinate (TOPROL-XL) 50 MG 24 hr  tablet   Long term current use of oral hypoglycemic drug (Chronic)   Chronic right hip pain (Chronic)    Following with Ortho      Relevant Medications   meloxicam (MOBIC) 15 MG tablet   Hyperlipidemia (Chronic)    Medication management: Lipitor 20 mg daily Lifestyle factors for lowering cholesterol include: Diet therapy - heart-healthy diet rich in fruits, veggies, fiber-rich whole grains, lean meats, chicken, fish (at least twice a week), fat-free or 1% dairy products; foods low in saturated/trans fats, cholesterol, sodium, and sugar. Mediterranean diet has shown to be very heart healthy. Regular exercise - recommend at least 30 minutes a day, 5 times per week Weight management         Relevant Medications   atorvastatin (LIPITOR) 20 MG tablet   hydrochlorothiazide (HYDRODIURIL) 12.5 MG tablet   lisinopril (ZESTRIL) 40 MG tablet   metoprolol succinate (TOPROL-XL) 50 MG 24 hr tablet   Other Relevant Orders   Comprehensive metabolic panel   Lipid panel   Non-insulin dependent type 2 diabetes mellitus (HCC)    Lab Results  Component Value Date   HGBA1C 7.3 (H) 02/03/2023  Fair control Continue current medications: metformin 850 mg BID, consider GLP1 UTD on vaccines, foot exam On ACEi  On Statin  Discussed diet and exercise F/u in 3 months       Relevant Medications   atorvastatin (LIPITOR) 20 MG tablet   lisinopril (ZESTRIL) 40 MG tablet   metFORMIN (GLUCOPHAGE) 850 MG tablet   Other Relevant Orders   Hemoglobin A1c   Other Visit Diagnoses     Elevated alkaline phosphatase level       Relevant Orders   Alkaline phosphatase, isoenzymes   Gamma GT         Return in about 3 months (around 09/02/2023) for routine follow-up.    Clayborne Dana, NP

## 2023-06-04 NOTE — Assessment & Plan Note (Signed)
Lab Results  Component Value Date   HGBA1C 7.3 (H) 02/03/2023  Fair control Continue current medications: metformin 850 mg BID, consider GLP1 UTD on vaccines, foot exam On ACEi  On Statin  Discussed diet and exercise F/u in 3 months

## 2023-06-05 ENCOUNTER — Other Ambulatory Visit: Payer: Self-pay

## 2023-06-05 ENCOUNTER — Other Ambulatory Visit (INDEPENDENT_AMBULATORY_CARE_PROVIDER_SITE_OTHER): Payer: Self-pay

## 2023-06-05 ENCOUNTER — Encounter: Payer: Self-pay | Admitting: Neurology

## 2023-06-05 ENCOUNTER — Ambulatory Visit: Payer: Medicaid Other | Admitting: Orthopedic Surgery

## 2023-06-05 VITALS — BP 132/87 | HR 73 | Ht 60.0 in | Wt 197.0 lb

## 2023-06-05 DIAGNOSIS — M5416 Radiculopathy, lumbar region: Secondary | ICD-10-CM

## 2023-06-05 DIAGNOSIS — R202 Paresthesia of skin: Secondary | ICD-10-CM

## 2023-06-05 LAB — LIPID PANEL
Cholesterol: 158 mg/dL (ref 0–200)
HDL: 50.2 mg/dL (ref >39.00)
LDL Cholesterol: 85 mg/dL (ref 0–99)
NonHDL: 107.71
Total CHOL/HDL Ratio: 3
Triglycerides: 112 mg/dL (ref 0.0–149.0)
VLDL: 22.4 mg/dL (ref 0.0–40.0)

## 2023-06-05 LAB — COMPREHENSIVE METABOLIC PANEL
ALT: 11 U/L (ref 0–35)
AST: 14 U/L (ref 0–37)
Albumin: 4 g/dL (ref 3.5–5.2)
Alkaline Phosphatase: 125 U/L — ABNORMAL HIGH (ref 39–117)
BUN: 15 mg/dL (ref 6–23)
CO2: 30 meq/L (ref 19–32)
Calcium: 9.7 mg/dL (ref 8.4–10.5)
Chloride: 105 meq/L (ref 96–112)
Creatinine, Ser: 0.66 mg/dL (ref 0.40–1.20)
GFR: 101.3 mL/min (ref >60.00)
Glucose, Bld: 78 mg/dL (ref 70–99)
Potassium: 3.6 meq/L (ref 3.5–5.1)
Sodium: 142 meq/L (ref 135–145)
Total Bilirubin: 0.3 mg/dL (ref 0.2–1.2)
Total Protein: 7.1 g/dL (ref 6.0–8.3)

## 2023-06-05 LAB — HEMOGLOBIN A1C: Hgb A1c MFr Bld: 7.3 % — ABNORMAL HIGH (ref 4.6–6.5)

## 2023-06-05 LAB — GAMMA GT: GGT: 12 U/L (ref 7–51)

## 2023-06-05 MED ORDER — PREGABALIN 75 MG PO CAPS: 75.0000 mg | ORAL_CAPSULE | Freq: Two times a day (BID) | ORAL | 1 refills | Status: DC

## 2023-06-05 NOTE — Telephone Encounter (Signed)
Message sent to luke to advise

## 2023-06-05 NOTE — Telephone Encounter (Signed)
I think at this point, I'd recommend evaluation by Dr Christell Constant to see what he thinks and if there's anything else to offer conservatively vs surgical intervention

## 2023-06-05 NOTE — Progress Notes (Signed)
Orthopedic Spine Surgery Office Note  Assessment: Patient is a 52 y.o. female with low back pain that radiates into bilateral lower extremities.  It radiates in multiple distributions that are not consistent with her L5/S1 disc herniation.  However, she does have pain in the right buttock and right posterior thigh that could be radicular in nature   Plan: -I explained that she has pain in multiple nerve distributions and her disc herniation at L5/S1 may affect the S1 nerve which could be causing her right buttock and right posterior thigh pain.  It would not explain any of the other pain that she is having.  The fact that she did not respond to an injection, in my mind, decreases the probability of her pain being from the disc herniation. Recommended a EMG/NCS for further work up -She had found the Lyrica helpful, so new prescription provided to her -Patient should return to office in 4 weeks, x-rays at next visit: none   Patient expressed understanding of the plan and all questions were answered to the patient's satisfaction.   ___________________________________________________________________________   History:  Patient is a 52 y.o. female who presents today for lumbar spine.  Patient has had over a year of low back pain that radiates into the bilateral groin and anterior thighs.  She also has pain in the right posterior thigh and buttock.  Pain is gotten progressively worse with time.  There is no trauma or injury that preceded the onset of pain.  She has not found relief with any of the treatment so far.  She cannot get any relief with a lumbar steroid injection.  She feels the pain on a daily basis.  Pain is felt with activity and at rest.   Weakness: yes, low back feels weaker. No other weakness noted Symptoms of imbalance: denies Paresthesias and numbness: denies Bowel or bladder incontinence: denies Saddle anesthesia: denies  Treatments tried: PT, tylenol, lyrica, lumbar steroid  injection  Review of systems: Denies fevers, unexplained weight loss, history of cancer. Has had pain that wakes her at night. Reports chills and night sweats   Past medical history: Migraines Fibromyalgia HTN DM (last A1c was 7.3 on 06/04/2023) HLD GERD Vertigo  Allergies: NKDA  Past surgical history:  Abdominal hysterectomy Bunionectomy Hernia repair Cholecystectomy Left shoulder arthroscopy  Social history: Denies use of nicotine product (smoking, vaping, patches, smokeless) Alcohol use: denies Denies recreational drug use   Physical Exam:  General: no acute distress, appears stated age Neurologic: alert, answering questions appropriately, following commands Respiratory: unlabored breathing on room air, symmetric chest rise Psychiatric: appropriate affect, normal cadence to speech   MSK (spine):  -Strength exam      Left  Right EHL    5/5  5/5 TA    5/5  5/5 GSC    5/5  5/5 Knee extension  5/5  5/5 Hip flexion   5/5  5/5  -Sensory exam    Sensation intact to light touch in L3-S1 nerve distributions of bilateral lower extremities  -Achilles DTR: 2/4 on the left, 2/4 on the right -Patellar tendon DTR: 2/4 on the left, 2/4 on the right  -Straight leg raise: Negative bilaterally -Femoral nerve stretch test: Negative bilaterally -Clonus: no beats bilaterally  -Left hip exam: No pain through range of motion, negative Stinchfield, negative FABER -Right hip exam: No pain through range of motion, negative Stinchfield, negative FABER  Imaging: XRs of the lumbar spine from 06/05/2023 were independently reviewed and interpreted, showing no significant degenerative changes.  No evidence of instability on flexion/extension views.  No fracture or dislocation seen.  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  No other  significant stenosis seen.   Patient name: Lauren Soto Patient MRN: [id number] Date of visit: 06/05/23

## 2023-06-05 NOTE — Telephone Encounter (Signed)
Double message

## 2023-06-10 LAB — ALKALINE PHOSPHATASE, ISOENZYMES
Alkaline Phosphatase: 150 [IU]/L — ABNORMAL HIGH (ref 44–121)
BONE FRACTION: 26 % (ref 14–68)
INTESTINAL FRAC.: 2 % (ref 0–18)
LIVER FRACTION: 72 % (ref 18–85)

## 2023-06-11 ENCOUNTER — Encounter: Payer: Self-pay | Admitting: Family Medicine

## 2023-06-11 DIAGNOSIS — E119 Type 2 diabetes mellitus without complications: Secondary | ICD-10-CM

## 2023-06-12 ENCOUNTER — Encounter: Payer: Self-pay | Admitting: Family Medicine

## 2023-06-12 NOTE — Telephone Encounter (Signed)
 Care team updated and letter sent for eye exam notes.

## 2023-06-13 ENCOUNTER — Telehealth: Payer: Self-pay

## 2023-06-13 DIAGNOSIS — E119 Type 2 diabetes mellitus without complications: Secondary | ICD-10-CM

## 2023-06-13 MED ORDER — TIRZEPATIDE 2.5 MG/0.5ML ~~LOC~~ SOAJ: 2.5000 mg | SUBCUTANEOUS | 2 refills | Status: DC

## 2023-06-13 NOTE — Telephone Encounter (Signed)
Patient called back and was made aware of provider message.

## 2023-06-13 NOTE — Telephone Encounter (Signed)
Spoke with Alcario Drought. She is okay trying Victoza first. Aware this is a daily injection. Please send to pharmacy on file.

## 2023-06-13 NOTE — Telephone Encounter (Signed)
Ok. So I'm guessing that is the same requirement for other GLP1s like Ozempic?  If so, ask her if she is okay with Victoza, but let her know it would be a daily injection. If she prefers to start with different oral therapy first, we can do that instead.

## 2023-06-13 NOTE — Telephone Encounter (Signed)
Pt's plan requires step therapy for [facility], she will need to try and fail 2 preferred alternatives first  Metformin- she is currently on already Byetta Bydureon  Victoza

## 2023-06-13 NOTE — Telephone Encounter (Signed)
LVM letting patient know information from [facility]. To call with any questions.

## 2023-06-13 NOTE — Telephone Encounter (Signed)
Yes- Ozempic is also listed as non-preferred and would require step therapy.

## 2023-06-13 NOTE — Telephone Encounter (Signed)
I will send in [facility] to see if we can get that covered.  She can try warm compresses 4+ times per day. If not improving, we can take a look at it.

## 2023-06-16 ENCOUNTER — Telehealth: Payer: Self-pay

## 2023-06-16 MED ORDER — LIRAGLUTIDE 18 MG/3ML ~~LOC~~ SOPN: PEN_INJECTOR | SUBCUTANEOUS | 3 refills | Status: DC

## 2023-06-16 NOTE — Telephone Encounter (Signed)
PA initiated via Covermymeds; KEY: [id number]. Awaiting determination.

## 2023-06-17 NOTE — Telephone Encounter (Signed)
PA approved. Effective 06/16/23 to 06/15/24.

## 2023-06-19 ENCOUNTER — Encounter: Payer: Self-pay | Admitting: Family Medicine

## 2023-06-19 ENCOUNTER — Ambulatory Visit (INDEPENDENT_AMBULATORY_CARE_PROVIDER_SITE_OTHER): Payer: Medicaid Other | Admitting: Family Medicine

## 2023-06-19 VITALS — BP 124/79 | HR 72 | Ht 60.0 in | Wt 192.0 lb

## 2023-06-19 DIAGNOSIS — L731 Pseudofolliculitis barbae: Secondary | ICD-10-CM

## 2023-06-19 NOTE — Progress Notes (Signed)
   Acute Office Visit  Subjective:     Patient ID: Lauren Soto, female    DOB: 12-Jan-1971, 52 y.o.   MRN: [id number]  Chief Complaint  Patient presents with   Mass    HPI Patient is in today for lesion to left axillary region.  Discussed the use of AI scribe software for clinical note transcription with the patient, who gave verbal consent to proceed.  History of Present Illness   The patient presented with a two-week history of a small, localized lesion in the left axillary region. Initially, she suspected it to be a boil due to its appearance, but the lack of growth over time led her to consider it might be an ingrown hair or a hairball. The patient had recently undergone a mammogram, the results of which were reported as normal. The lesion was described as tender to touch, but there were no other symptoms such as swelling in the surrounding area. She has been managing the lesion with warm compresses.          ROS All review of systems negative except what is listed in the HPI      Objective:    BP 124/79   Pulse 72   Ht 5' (1.524 m)   Wt 192 lb (87.1 kg)   LMP 06/04/2012   SpO2 98%   BMI 37.50 kg/m    Physical Exam Vitals reviewed.  Constitutional:      Appearance: Normal appearance.  Chest:    Skin:    General: Skin is warm and dry.  Neurological:     Mental Status: She is alert and oriented to person, place, and time.  Psychiatric:        Mood and Affect: Mood normal.        Behavior: Behavior normal.        Thought Content: Thought content normal.        Judgment: Judgment normal.      No results found for any visits on 06/19/23.      Assessment & Plan:   Problem List Items Addressed This Visit   None Visit Diagnoses       Ingrown hair    -  Primary      Likely ingrown hair or early folliculitis/abscess formation. Mildly tender to touch, no signs of systemic infection or significant local inflammation. Recent mammogram was  normal. -Apply warm compresses 4-5 times daily. -If it opens apply topical antibiotic  -If it enlarges, develops spreading redness, or significant pain, patient to send a picture via MyChart for further evaluation. -If necessary, consider oral antibiotics or incision and drainage. Not indicated at this time.        No orders of the defined types were placed in this encounter.   Return if symptoms worsen or fail to improve.  Clayborne Dana, NP

## 2023-06-23 ENCOUNTER — Telehealth: Payer: Self-pay | Admitting: Neurology

## 2023-06-23 NOTE — Telephone Encounter (Signed)
Note received from [facility] stating patient is not under their care.

## 2023-06-27 ENCOUNTER — Ambulatory Visit: Payer: Medicaid Other | Admitting: Neurology

## 2023-07-03 ENCOUNTER — Encounter: Payer: Medicaid Other | Admitting: Orthopedic Surgery

## 2023-07-10 ENCOUNTER — Ambulatory Visit: Payer: Medicaid Other | Admitting: Podiatry

## 2023-07-11 ENCOUNTER — Encounter: Payer: Medicaid Other | Admitting: Neurology

## 2023-07-18 ENCOUNTER — Ambulatory Visit: Payer: Medicaid Other | Admitting: Neurology

## 2023-07-18 DIAGNOSIS — R202 Paresthesia of skin: Secondary | ICD-10-CM

## 2023-07-18 NOTE — Procedures (Signed)
  [facility]  [address] [place], [address]  [place], [place] [address] Tel: [phone] Fax: [phone] Test Date:  07/18/2023  Patient: Lauren Soto DOB: 10-09-1970 Physician: Nita Sickle, DO  Sex: Female Height: 5\' 0"  Ref Phys: Willia Craze, MD  ID#: [id number]   Technician:    History: This is a 52 year old female referred for evaluation of right leg pain.  NCV & EMG Findings: Extensive electrodiagnostic testing of the right lower extremity shows:  Right sural and superficial peroneal sensory responses are within normal limits. Right peroneal and tibial motor responses are within normal limits. Right tibial H reflex study is within normal limits. There is no evidence of active or chronic motor axonal loss changes affecting any of the tested muscles.  Motor unit configuration and recruitment pattern is within normal limits.  Impression: This is a normal study of the right lower extremity.  In particular, there is no evidence of a lumbosacral radiculopathy or large fiber sensorimotor polyneuropathy.   ___________________________ Nita Sickle, DO    Nerve Conduction Studies   Stim Site NR Peak (ms) Norm Peak (ms) O-P Amp (V) Norm O-P Amp  Right Sup Peroneal Anti Sensory (Ant Lat Mall)  32 C  12 cm    1.9 <4.6 10.8 >4  Right Sural Anti Sensory (Lat Mall)  32 C  Calf    2.3 <4.6 10.6 >4     Stim Site NR Onset (ms) Norm Onset (ms) O-P Amp (mV) Norm O-P Amp Site1 Site2 Delta-0 (ms) Dist (cm) Vel (m/s) Norm Vel (m/s)  Right Peroneal Motor (Ext Dig Brev)  32 C  Ankle    2.9 <6.0 6.6 >2.5 B Fib Ankle 6.2 34.0 55 >40  B Fib    9.1  5.8  Poplt B Fib 1.6 9.0 56 >40  Poplt    10.7  5.4         Right Tibial Motor (Abd Hall Brev)  32 C  Ankle    2.6 <6.0 8.8 >4 Knee Ankle 7.2 34.0 47 >40  Knee    9.8  7.1          Electromyography   Side Muscle Ins.Act Fibs Fasc Recrt Amp Dur Poly Activation Comment  Right AntTibialis Nml Nml Nml Nml Nml Nml Nml Nml  N/A  Right Gastroc Nml Nml Nml Nml Nml Nml Nml Nml N/A  Right Flex Dig Long Nml Nml Nml Nml Nml Nml Nml Nml N/A  Right RectFemoris Nml Nml Nml Nml Nml Nml Nml Nml N/A  Right BicepsFemS Nml Nml Nml Nml Nml Nml Nml Nml N/A  Right GluteusMed Nml Nml Nml Nml Nml Nml Nml Nml N/A      Waveforms:

## 2023-07-19 ENCOUNTER — Other Ambulatory Visit: Payer: Self-pay | Admitting: Surgical

## 2023-07-21 ENCOUNTER — Emergency Department (HOSPITAL_BASED_OUTPATIENT_CLINIC_OR_DEPARTMENT_OTHER): Payer: Medicaid Other

## 2023-07-21 ENCOUNTER — Emergency Department (HOSPITAL_BASED_OUTPATIENT_CLINIC_OR_DEPARTMENT_OTHER)
Admission: EM | Admit: 2023-07-21 | Discharge: 2023-07-21 | Disposition: A | Discharge status: Home / Self Care | Payer: Medicaid Other

## 2023-07-21 ENCOUNTER — Encounter (HOSPITAL_BASED_OUTPATIENT_CLINIC_OR_DEPARTMENT_OTHER): Payer: Self-pay

## 2023-07-21 ENCOUNTER — Ambulatory Visit: Payer: Self-pay | Admitting: Family Medicine

## 2023-07-21 ENCOUNTER — Other Ambulatory Visit: Payer: Self-pay

## 2023-07-21 DIAGNOSIS — R1084 Generalized abdominal pain: Secondary | ICD-10-CM | POA: Diagnosis not present

## 2023-07-21 DIAGNOSIS — Z90710 Acquired absence of both cervix and uterus: Secondary | ICD-10-CM | POA: Diagnosis not present

## 2023-07-21 DIAGNOSIS — Z9049 Acquired absence of other specified parts of digestive tract: Secondary | ICD-10-CM | POA: Diagnosis not present

## 2023-07-21 DIAGNOSIS — K449 Diaphragmatic hernia without obstruction or gangrene: Secondary | ICD-10-CM | POA: Diagnosis not present

## 2023-07-21 DIAGNOSIS — R1031 Right lower quadrant pain: Secondary | ICD-10-CM | POA: Diagnosis not present

## 2023-07-21 LAB — CBC
HCT: 36.3 % (ref 36.0–46.0)
Hemoglobin: 11.9 g/dL — ABNORMAL LOW (ref 12.0–15.0)
MCH: 27.5 pg (ref 26.0–34.0)
MCHC: 32.8 g/dL (ref 30.0–36.0)
MCV: 84 fL (ref 80.0–100.0)
Platelets: 344 10*3/uL (ref 150–400)
RBC: 4.32 MIL/uL (ref 3.87–5.11)
RDW: 15.7 % — ABNORMAL HIGH (ref 11.5–15.5)
WBC: 8.2 10*3/uL (ref 4.0–10.5)
nRBC: 0 % (ref 0.0–0.2)

## 2023-07-21 LAB — URINALYSIS, ROUTINE W REFLEX MICROSCOPIC
Bilirubin Urine: NEGATIVE
Glucose, UA: NEGATIVE mg/dL
Hgb urine dipstick: NEGATIVE
Ketones, ur: NEGATIVE mg/dL
Leukocytes,Ua: NEGATIVE
Nitrite: NEGATIVE
Protein, ur: NEGATIVE mg/dL
Specific Gravity, Urine: 1.02 (ref 1.005–1.030)
pH: 6 (ref 5.0–8.0)

## 2023-07-21 LAB — COMPREHENSIVE METABOLIC PANEL
ALT: 15 U/L (ref 0–44)
AST: 15 U/L (ref 15–41)
Albumin: 3.6 g/dL (ref 3.5–5.0)
Alkaline Phosphatase: 130 U/L — ABNORMAL HIGH (ref 38–126)
Anion gap: 6 (ref 5–15)
BUN: 6 mg/dL (ref 6–20)
CO2: 28 mmol/L (ref 22–32)
Calcium: 9.3 mg/dL (ref 8.9–10.3)
Chloride: 105 mmol/L (ref 98–111)
Creatinine, Ser: 0.57 mg/dL (ref 0.44–1.00)
GFR, Estimated: >60 mL/min (ref >60)
Glucose, Bld: 129 mg/dL — ABNORMAL HIGH (ref 70–99)
Potassium: 3.6 mmol/L (ref 3.5–5.1)
Sodium: 139 mmol/L (ref 135–145)
Total Bilirubin: 0.3 mg/dL (ref 0.0–1.2)
Total Protein: 7.3 g/dL (ref 6.5–8.1)

## 2023-07-21 LAB — LIPASE, BLOOD: Lipase: 31 U/L (ref 11–51)

## 2023-07-21 MED ORDER — IOHEXOL 300 MG/ML  SOLN
100.0000 mL | Freq: Once | INTRAMUSCULAR | Status: AC | PRN
  Administered 2023-07-21: 100 mL via INTRAVENOUS

## 2023-07-21 MED ORDER — FAMOTIDINE 20 MG PO TABS: 20.0000 mg | ORAL_TABLET | Freq: Two times a day (BID) | ORAL | 0 refills | Status: DC

## 2023-07-21 MED ORDER — DICYCLOMINE HCL 20 MG PO TABS: 10.0000 mg | ORAL_TABLET | Freq: Two times a day (BID) | ORAL | 0 refills | Status: DC | PRN

## 2023-07-21 MED ORDER — DICYCLOMINE HCL 10 MG PO CAPS
10.0000 mg | ORAL_CAPSULE | Freq: Once | ORAL | Status: AC
  Administered 2023-07-21: 10 mg via ORAL
  Filled 2023-07-21: qty 1

## 2023-07-21 MED ORDER — ALUM & MAG HYDROXIDE-SIMETH 200-200-20 MG/5ML PO SUSP
30.0000 mL | Freq: Once | ORAL | Status: AC
  Administered 2023-07-21: 30 mL via ORAL
  Filled 2023-07-21: qty 30

## 2023-07-21 MED ORDER — FAMOTIDINE 20 MG PO TABS
20.0000 mg | ORAL_TABLET | Freq: Once | ORAL | Status: AC
  Administered 2023-07-21: 20 mg via ORAL
  Filled 2023-07-21: qty 1

## 2023-07-21 NOTE — ED Notes (Signed)
Patient transported to CT 

## 2023-07-21 NOTE — ED Triage Notes (Signed)
Patient arrives POV to triage with complaints of worsening abdominal pain, with more pain on her right side. Patient rates her pain a 6/10. No vomiting and regular bowel movements.

## 2023-07-21 NOTE — ED Notes (Signed)
Pt back from CT scan Heating pad on rt. Upper arm where area is swollen Pt requesting not to have another IV placed and states she is feeling better after meds given earlier

## 2023-07-21 NOTE — Telephone Encounter (Signed)
Chief Complaint: abdominal pain Symptoms: 8/10 abdominal epigastric pain that radiates to her right side Frequency: constant since Saturday Pertinent Negatives: Patient denies N/V, painful urination, chest pain, difficulty breathing, fever Disposition: [x] ED /[] Urgent Care (no appt availability in office) / [] Appointment(In office/virtual)/ []  [facility]/ [] Home Care/ [] Refused Recommended Disposition /[] [facility]/ []  Follow-up with PCP Additional Notes: Pt reports 8/10 abdominal pain "right under the breast" that radiates the right side of her abdomen. Pt reports onset of pain was sudden. Pt denies N/V and states her bowel movements have been normal aside from one episode of diarrhea. Pt states she is passing gas normally. Pt denies fever, chest pain, difficulty breathing. Per protocol, RN advised pt to go to the ED. Pt states her husband will bring her. Pt advised to call back or call EMS for any worsening symptoms in the meantime. Pt verbalized understanding. Copied from CRM [id number]. Topic: Clinical - Red Word Triage >> Jul 21, 2023  8:13 AM Marica Otter wrote: [facility] that prompted transfer to Nurse Triage: Patient having stomach pains middle lower right side of stomach. Reason for Disposition  [1] SEVERE pain (e.g., excruciating) AND [2] present > 1 hour  Answer Assessment - Initial Assessment Questions 1. LOCATION: "Where does it hurt?"      "Right up under my breast, down to the right side" 2. RADIATION: "Does the pain shoot anywhere else?" (e.g., chest, back)     Right side 3. ONSET: "When did the pain begin?" (e.g., minutes, hours or days ago)      Since Saturday night  4. SUDDEN: "Gradual or sudden onset?"     Suddenly 5. PATTERN "Does the pain come and go, or is it constant?"    - If it comes and goes: "How long does it last?" "Do you have pain now?"     (Note: Comes and goes means the pain is intermittent. It goes away completely between bouts.)    - If  constant: "Is it getting better, staying the same, or getting worse?"      (Note: Constant means the pain never goes away completely; most serious pain is constant and gets worse.)      Constant 6. SEVERITY: "How bad is the pain?"  (e.g., Scale 1-10; mild, moderate, or severe)    - MILD (1-3): Doesn't interfere with normal activities, abdomen soft and not tender to touch.     - MODERATE (4-7): Interferes with normal activities or awakens from sleep, abdomen tender to touch.     - SEVERE (8-10): Excruciating pain, doubled over, unable to do any normal activities.       8/10 7. RECURRENT SYMPTOM: "Have you ever had this type of stomach pain before?" If Yes, ask: "When was the last time?" and "What happened that time?"      No 8. CAUSE: "What do you think is causing the stomach pain?"     "I don't know - my husband thinks maybe it's gas" 9. RELIEVING/AGGRAVATING FACTORS: "What makes it better or worse?" (e.g., antacids, bending or twisting motion, bowel movement)     Feels better with sitting, worse with standing 10. OTHER SYMPTOMS: "Do you have any other symptoms?" (e.g., back pain, diarrhea, fever, urination pain, vomiting)       Abdominal cramping with urination, one loose BM  Protocols used: Abdominal Pain - Female-A-AH

## 2023-07-21 NOTE — Discharge Instructions (Signed)
Please follow-up with your primary doctor.  Turn immediately felt fevers, chills, chest pain, shortness of breath, worsening abdominal pain, inability to eat or drink due to nausea vomiting.  He stop having bowel movements.  Or he develop any new worsening symptoms that are concerning to you.

## 2023-07-21 NOTE — Progress Notes (Signed)
Patient came to CT for a CT Abd/Pelvis with contrast. Patient had an 33 G IV that had been placed with U/S guidance> About halfway thru the injection, patient complained of pain in IV area. Contrast was stopped. Initial upper abdomen images showed no contrast. The Left AC area showed some signs of extravasation.  Area was marked on skin. IV was removed and heat pack placed. Post extravasation orders were placed in EPIC. Patient refused another IV attempt after returning to ED and EDP Young requested study be changed to non contrast CT Abd/Pelvis. This was completed.

## 2023-07-21 NOTE — Telephone Encounter (Signed)
FYI - patient going to ED for evaluation of RUQ pain.

## 2023-07-21 NOTE — ED Provider Notes (Signed)
[facility] [place] Provider Note   CSN: [id number] Arrival date & time: 07/21/23  1041     History  Chief Complaint  Patient presents with   Abdominal Pain    Lauren Soto is a 53 y.o. female.  This is a 53 year old female presenting emergency department for abdominal pain for the past 2 days.  Generally lower, but then spread to her upper abdomen, but now back to across lower.  No nausea vomiting.  Had several loose bowel movements yesterday.  None today.  No fevers or chills.  Describes pain as cramping and somewhat worse with movement.   Abdominal Pain      Home Medications Prior to Admission medications   Medication Sig Start Date End Date Taking? Authorizing Provider  dicyclomine (BENTYL) 20 MG tablet Take 0.5 tablets (10 mg total) by mouth 2 (two) times daily as needed for up to 5 days for spasms. 07/21/23 07/26/23 Yes Shamia Uppal, Harmon Dun, DO  famotidine (PEPCID) 20 MG tablet Take 1 tablet (20 mg total) by mouth 2 (two) times daily. 07/21/23  Yes Yuriy Cui J, DO  AJOVY 225 MG/1.5ML SOAJ Inject into the skin. 04/19/23   [provider]  atorvastatin (LIPITOR) 20 MG tablet Take 1 tablet (20 mg total) by mouth daily. 06/04/23   Clayborne Dana, NP  diclofenac Sodium (VOLTAREN) 1 % GEL Apply 2 g topically 4 (four) times daily as needed (pain).    [provider]  hydrochlorothiazide (HYDRODIURIL) 12.5 MG tablet Take 1 tablet (12.5 mg total) by mouth daily. 06/04/23   Clayborne Dana, NP  liraglutide (VICTOZA) 18 MG/3ML SOPN Start 0.6mg  SQ once a day for 7 days, then increase to 1.2mg  once a day Patient not taking: Reported on 06/19/2023 06/16/23   Hyman Hopes B, NP  lisinopril (ZESTRIL) 40 MG tablet Take 1 tablet (40 mg total) by mouth daily. 06/04/23   Clayborne Dana, NP  meclizine (ANTIVERT) 25 MG tablet Take 1 tablet (25 mg total) by mouth 3 (three) times daily as needed for dizziness. 02/25/23   Drema Dallas, DO   meloxicam (MOBIC) 15 MG tablet Take 1 tablet (15 mg total) by mouth daily. 06/04/23   Clayborne Dana, NP  metFORMIN (GLUCOPHAGE) 850 MG tablet Take 1 tablet (850 mg total) by mouth 2 (two) times daily. 06/04/23   Clayborne Dana, NP  metoprolol succinate (TOPROL-XL) 50 MG 24 hr tablet Take 1 tablet (50 mg total) by mouth daily. Take with or immediately following a meal. 06/04/23 09/02/23  Clayborne Dana, NP  nitroGLYCERIN (NITROSTAT) 0.4 MG SL tablet Place 1 tablet (0.4 mg total) under the tongue every 5 (five) minutes as needed for chest pain. Patient not taking: Reported on 06/19/2023 02/27/23   Clayborne Dana, NP  omeprazole (PRILOSEC) 20 MG capsule Take 1 capsule (20 mg total) by mouth every morning. 12/02/22   Clayborne Dana, NP  ondansetron (ZOFRAN-ODT) 4 MG disintegrating tablet Take 1 tablet (4 mg total) by mouth every 8 (eight) hours as needed. 02/25/23   Drema Dallas, DO  potassium chloride SA (KLOR-CON M) 20 MEQ tablet Take 1 tablet (20 mEq total) by mouth daily. 04/24/23   Eulis Foster, FNP  pregabalin (LYRICA) 75 MG capsule Take 1 capsule (75 mg total) by mouth 2 (two) times daily. 07/21/23   Magnant, Charles L, PA-C  rizatriptan (MAXALT-MLT) 10 MG disintegrating tablet Take 1 tablet (10 mg total) by mouth as needed  for migraine (May repeat in 2 hours.  Maximum 2 tablets in 24 hours.). May repeat in 2 hours if needed 02/25/23   Drema Dallas, DO  urea (CARMOL) 10 % cream Apply topically as needed. 04/04/23   Standiford, Jenelle Mages, DPM      Allergies    Patient has no known allergies.    Review of Systems   Review of Systems  Gastrointestinal:  Positive for abdominal pain.    Physical Exam Updated Vital Signs BP (!) 140/94   Pulse 78   Temp 98.1 F (36.7 C)   Resp 18   Ht 5' (1.524 m)   Wt 93.4 kg   LMP 06/04/2012   SpO2 96%   BMI 40.23 kg/m  Physical Exam Vitals and nursing note reviewed.  Constitutional:      General: She is not in acute distress.    Appearance: She is  obese. She is not toxic-appearing.  Cardiovascular:     Rate and Rhythm: Normal rate and regular rhythm.     Pulses: Normal pulses.  Pulmonary:     Effort: Pulmonary effort is normal.     Breath sounds: Normal breath sounds.  Abdominal:     General: Abdomen is flat.     Tenderness: There is generalized abdominal tenderness (mild). There is no guarding or rebound.     Hernia: No hernia is present.  Skin:    General: Skin is warm and dry.  Neurological:     Mental Status: She is alert.     ED Results / Procedures / Treatments   Labs (all labs ordered are listed, but only abnormal results are displayed) Labs Reviewed  COMPREHENSIVE METABOLIC PANEL - Abnormal; Notable for the following components:      Result Value   Glucose, Bld 129 (*)    Alkaline Phosphatase 130 (*)    All other components within normal limits  CBC - Abnormal; Notable for the following components:   Hemoglobin 11.9 (*)    RDW 15.7 (*)    All other components within normal limits  LIPASE, BLOOD  URINALYSIS, ROUTINE W REFLEX MICROSCOPIC    EKG None  Radiology CT ABDOMEN PELVIS WO CONTRAST Result Date: 07/21/2023 CLINICAL DATA:  Right lower quadrant pain EXAM: CT ABDOMEN AND PELVIS WITHOUT CONTRAST TECHNIQUE: Multidetector CT imaging of the abdomen and pelvis was performed following the standard protocol without IV contrast. RADIATION DOSE REDUCTION: This exam was performed according to the departmental dose-optimization program which includes automated exposure control, adjustment of the mA and/or kV according to patient size and/or use of iterative reconstruction technique. COMPARISON:  CT 02/02/2023 FINDINGS: Lower chest: Lung bases demonstrates small focus of atelectasis or infiltrate at the posterior left lung base. Small hiatal hernia Hepatobiliary: No focal liver abnormality is seen. Status post cholecystectomy. No biliary dilatation. Pancreas: Unremarkable. No pancreatic ductal dilatation or surrounding  inflammatory changes. Spleen: Normal in size without focal abnormality. Adrenals/Urinary Tract: Adrenal glands are normal. Kidneys show no hydronephrosis. Excreted contrast within the renal collecting systems and bladder limits assessment for stone disease Stomach/Bowel: Stomach nonenlarged. No dilated small bowel. Appendix not well seen but no right lower quadrant inflammation Vascular/Lymphatic: No significant vascular findings are present. No enlarged abdominal or pelvic lymph nodes. Reproductive: Status post hysterectomy. No adnexal masses. Other: Negative for pelvic effusion or free air Musculoskeletal: No acute or suspicious osseous abnormality. IMPRESSION: 1. No CT evidence for acute intra-abdominal or pelvic abnormality. 2. Small focus of atelectasis or infiltrate at the posterior left  lung base. Small hiatal hernia. Electronically Signed   By: Jasmine Pang M.D.   On: 07/21/2023 18:53    Procedures Procedures    Medications Ordered in ED Medications  dicyclomine (BENTYL) capsule 10 mg (10 mg Oral Given 07/21/23 1636)  alum & mag hydroxide-simeth (MAALOX/MYLANTA) 200-200-20 MG/5ML suspension 30 mL (30 mLs Oral Given 07/21/23 1637)  famotidine (PEPCID) tablet 20 mg (20 mg Oral Given 07/21/23 1636)  iohexol (OMNIPAQUE) 300 MG/ML solution 100 mL (100 mLs Intravenous Contrast Given 07/21/23 1727)    ED Course/ Medical Decision Making/ A&P                                 Medical Decision Making Is a well-appearing 53 year old female presenting emergency department for abdominal pain.  She is afebrile nontachycardic hemodynamically stable.  Has soft abdomen, with mild diffuse tenderness.  Does have several abdominal surgeries in the past including hernia and hysterectomy per chart review.  Clinically does not appear to be obstructed.  Labs with no leukocytosis to suggest systemic infection.  Comprehensive metabolic panel with no significant metabolic derangements.  Normal kidney function.  No  transaminitis to suggest hepatobiliary disease.  Lipase is normal.  Pancreatitis is unlikely.  Urine without evidence of urinary tract infection.  Given patient's surgical history and tenderness to lower abdomen CT scan to evaluate for acute surgical pathology.  CT scan negative.  Patient treated supportively with Bentyl, GI cocktail and Pepcid with improvement of her symptoms.  Given negative workup, reassuring vitals and exam.  Stable for discharge at this time.  Follow-up with primary doctor.  Amount and/or Complexity of Data Reviewed Independent Historian:     Details: Family member notes hysterectomy External Data Reviewed:     Details: Per chart review had a CT chest abdomen pelvis 8/24 with no acute findings Labs: ordered. Decision-making details documented in ED Course. Radiology: ordered. Decision-making details documented in ED Course.  Risk OTC drugs. Prescription drug management. Decision regarding hospitalization.          Final Clinical Impression(s) / ED Diagnoses Final diagnoses:  Generalized abdominal pain    Rx / DC Orders ED Discharge Orders          Ordered    dicyclomine (BENTYL) 20 MG tablet  2 times daily PRN        07/21/23 1926    famotidine (PEPCID) 20 MG tablet  2 times daily        07/21/23 1926              Coral Spikes, [place] 07/21/23 1958

## 2023-07-22 ENCOUNTER — Telehealth: Payer: Self-pay

## 2023-07-22 NOTE — Telephone Encounter (Signed)
Copied from CRM [id number]. Topic: Clinical - Prescription Issue >> Jul 22, 2023  4:49 PM Corin V wrote: Reason for CRM: Patient is at the pharmacy and she is asking that Hyman Hopes write an Rx for the needles so insurance will cover. Please send Rx to: [id number] - [place], [place] - [address], [place] [address] Phone: [phone]  Fax: [phone]

## 2023-07-23 MED ORDER — NOVOFINE PEN NEEDLE 32G X 6 MM MISC: 1.0000 | Freq: Every day | 2 refills | Status: AC

## 2023-07-23 NOTE — Telephone Encounter (Signed)
Called patient and confirmed she needed this for Victoza. RX sent to pharmacy.    Copied from CRM [id number]. Topic: Clinical - Prescription Issue >> Jul 22, 2023  5:34 PM Corin V wrote: Reason for CRM: Patient called back regarding the Rx for the needles. Please call patient back in the morning to discuss if she bout any out of pocket and what she needs to do to pick them up with Rx.

## 2023-07-23 NOTE — Addendum Note (Signed)
Addended by: Silvio Pate on: 07/23/2023 09:15 AM   Modules accepted: Orders

## 2023-07-24 ENCOUNTER — Ambulatory Visit: Payer: Medicaid Other | Admitting: Orthopedic Surgery

## 2023-07-24 DIAGNOSIS — M797 Fibromyalgia: Secondary | ICD-10-CM

## 2023-07-24 MED ORDER — CYCLOBENZAPRINE HCL 10 MG PO TABS: 10.0000 mg | ORAL_TABLET | Freq: Three times a day (TID) | ORAL | 0 refills | Status: DC | PRN

## 2023-07-24 MED ORDER — PREGABALIN 75 MG PO CAPS: 75.0000 mg | ORAL_CAPSULE | Freq: Two times a day (BID) | ORAL | 2 refills | Status: DC

## 2023-07-24 NOTE — Progress Notes (Signed)
Orthopedic Spine Surgery Office Note   Assessment: Patient is a 53 y.o. female with low back pain that radiates into bilateral lower extremities.  It radiates in multiple distributions that are not consistent with her L5/S1 disc herniation.  She also has shoulder pain, neck pain, and multiple other joint pains     Plan: -I went over again that she has a disc herniation that could be affecting the right S1 nerve.  However, she has symptoms in multiple distributions that would not match S1.  She also did not respond to an injection or show any evidence of radiculopathy on EMG/NCS.  Accordingly, I did not recommend surgery as I am not certain it would provide her with any significant benefit but would expose her to potential for complication and risk.  She expressed understanding.  She also relayed to me that she has multiple other joints that hurt and has a history of fibromyalgia.  She is not currently getting treated for that.  I talked about a referral to pain management, she was interested in trying that.  A referral was provided to her today. -I provided her with refills of Flexeril and Lyrica today since they have given her relief in the past -Patient should return to office on an as-needed basis     Patient expressed understanding of the plan and all questions were answered to the patient's satisfaction.    ___________________________________________________________________________     History:   Patient is a 53 y.o. female who presents today for follow-up on her lumbar spine.  Patient continues to have low back pain that radiates into her bilateral lower extremities.  She feels a going into the groin and the anterior thighs.  She also has pain in her posterior thigh and buttock on the right side.  She also reports neck pain and shoulder pain.  She said that her pain will migrate around to different joints.  I will also wax and wane in terms of intensity.  She has pain on a daily basis.  No  bowel or bladder incontinence.  No saddle anesthesia.  No weakness in the lower extremities.   Treatments tried: PT, tylenol, lyrica, lumbar steroid injection     Physical Exam:   General: no acute distress, appears stated age Neurologic: alert, answering questions appropriately, following commands Respiratory: unlabored breathing on room air, symmetric chest rise Psychiatric: appropriate affect, normal cadence to speech     MSK (spine):   -Strength exam                                                   Left                  Right EHL                              5/5                  5/5 TA                                 5/5                  5/5 GSC  5/5                  5/5 Knee extension            5/5                  5/5 Hip flexion                    5/5                  5/5   -Sensory exam                           Sensation intact to light touch in L3-S1 nerve distributions of bilateral lower extremities     Imaging: XRs of the lumbar spine from 06/05/2023 were previously independently reviewed and interpreted, showing no significant degenerative changes.  No evidence of instability on flexion/extension views.  No fracture or dislocation seen.   MRI of the lumbar spine from 11/24/2022 was previously independently reviewed and interpreted, showing disc herniation at L5/S1 that is larger on the right side causing lateral recess stenosis.  No other significant stenosis seen.     Patient name: Lauren Soto Patient MRN: [id number] Date of visit: 07/24/23

## 2023-08-04 ENCOUNTER — Encounter: Payer: Self-pay | Admitting: Physical Medicine and Rehabilitation

## 2023-08-04 ENCOUNTER — Encounter: Payer: Self-pay | Admitting: Family Medicine

## 2023-08-04 ENCOUNTER — Ambulatory Visit (INDEPENDENT_AMBULATORY_CARE_PROVIDER_SITE_OTHER): Payer: Medicaid Other | Admitting: Family Medicine

## 2023-08-04 VITALS — BP 109/74 | HR 90 | Ht 60.0 in | Wt 192.0 lb

## 2023-08-04 DIAGNOSIS — R1013 Epigastric pain: Secondary | ICD-10-CM

## 2023-08-04 MED ORDER — FAMOTIDINE 20 MG PO TABS: 20.0000 mg | ORAL_TABLET | Freq: Two times a day (BID) | ORAL | 1 refills | Status: DC

## 2023-08-04 MED ORDER — DICYCLOMINE HCL 20 MG PO TABS: 10.0000 mg | ORAL_TABLET | Freq: Two times a day (BID) | ORAL | 0 refills | Status: AC | PRN

## 2023-08-04 NOTE — Progress Notes (Signed)
Acute Office Visit  Subjective:     Patient ID: Lauren Soto, female    DOB: April 04, 1971, 53 y.o.   MRN: [id number]  Chief Complaint  Patient presents with   Hospitalization Follow-up   Abdominal Pain    Abdominal Pain   Patient is in today for  ED follow-up (07/21/2023 [facility] ED).    Discussed the use of AI scribe software for clinical note transcription with the patient, who gave verbal consent to proceed.  History of Present Illness   The patient presents for ED follow-up for abdominal pain and cramping.  They experience abdominal pain described as 'crampy' and 'achy,' initially located in the middle of the abdomen and sometimes radiating to the right side. The pain intensified during urination and began on a Friday or Saturday night, leading to an emergency room visit by the following Monday due to severe discomfort. No diarrhea, but there is an increase in bowel movement frequency, having two or three bowel movements over the course of the symptoms. No vomiting or blood in the stool. Labs and CT were unremarkable. She was started on Bentyl and Pepcid with improvement.   The pain subsided somewhat but now occurs after eating, with discomfort around the belly button and sometimes radiating upwards, accompanied by reflux symptoms. They feel nauseous at times.  They were prescribed Bentyl PRN, and are currently taking Prilosec (omeprazole) once a day and Pepcid (famotidine) twice a day. They have a history of an incomplete colonoscopy due to inadequate bowel preparation, and it is unclear if an upper endoscopy was performed. She is supposed to be calling them to reschedule.   Stress is mentioned as a potential factor.           All review of systems negative except what is listed in the HPI      Objective:    BP 109/74   Pulse 90   Ht 5' (1.524 m)   Wt 192 lb (87.1 kg)   LMP 06/04/2012   SpO2 96%   BMI 37.50 kg/m    Physical Exam Vitals reviewed.   Constitutional:      Appearance: She is well-developed.  Pulmonary:     Effort: Pulmonary effort is normal.  Abdominal:     General: There is no distension.     Palpations: Abdomen is soft. There is no shifting dullness or mass.     Tenderness: There is abdominal tenderness in the epigastric area.  Skin:    General: Skin is warm and dry.  Neurological:     Mental Status: She is alert and oriented to person, place, and time.  Psychiatric:        Mood and Affect: Mood normal.        Behavior: Behavior normal.     No results found for any visits on 08/04/23.      Assessment & Plan:   Problem List Items Addressed This Visit   None Visit Diagnoses       Epigastric pain    -  Primary   Relevant Medications   famotidine (PEPCID) 20 MG tablet     Epigastric pain, worsened after eating, with some reflux symptoms. No diarrhea. Incomplete colonoscopy last year due to inadequate bowel prep. Currently on Prilosec and Pepcid for symptoms. -Increase Omeprazole to twice daily. -Continue Famotidine twice daily. -Consider Carafate for symptomatic relief if not making good progress. -Contact GI for further evaluation if not improving.  -Consider H. pylori breath test if able to  discontinue PPI and H2 blocker for two weeks. -Advised on diet modifications to reduce symptoms.  Bentyl prescribed for spasms, no current diarrhea. -Continue Bentyl as needed for spasms.      Meds ordered this encounter  Medications   famotidine (PEPCID) 20 MG tablet    Sig: Take 1 tablet (20 mg total) by mouth 2 (two) times daily.    Dispense:  60 tablet    Refill:  1    Supervising Provider:   Danise Edge A [[id number]]   dicyclomine (BENTYL) 20 MG tablet    Sig: Take 0.5 tablets (10 mg total) by mouth 2 (two) times daily as needed for spasms.    Dispense:  10 tablet    Refill:  0    Supervising Provider:   Danise Edge A [[id number]]    Return if symptoms worsen or fail to improve.  Clayborne Dana,  NP

## 2023-08-04 NOTE — Patient Instructions (Addendum)
Lifestyle measures for reflux: - Avoid meals or carbonated beverages within 3 hours of bedtime - Minimize intake of fried, fatty, and spicy foods (this will help decrease gastric acid production) - Raise the head of the bed using 4-6 inch blocks (especially if symptoms are present at night) - Maintain a healthy weight and avoid tight fitting clothes, especially around the waist  - Avoid foods that relax the sphincter or worsen symptoms (chocolate, peppermint, fatty foods, citrus, spicy foods, tomatoes, coffee, caffeine)  - Minimize use of NSAIDs (ibuprofen, Aleve, etc), nicotine, and alcohol   Recommend getting in with your GI doctor. If it's going to be awhile before they see can you, we can try an H. Pylori test (but you will need to be off of omeprazole and famotidine for at least 2 weeks for accurate testing).

## 2023-08-13 ENCOUNTER — Telehealth: Payer: Self-pay

## 2023-08-13 NOTE — Telephone Encounter (Signed)
PA needed Ajovy

## 2023-08-18 ENCOUNTER — Telehealth: Payer: Self-pay | Admitting: Neurology

## 2023-08-18 DIAGNOSIS — R1013 Epigastric pain: Secondary | ICD-10-CM

## 2023-08-18 NOTE — Telephone Encounter (Signed)
Looks like Pepcid and Prilosec to stop for two weeks to get an H Pylori test. Okay to continue Bentyl?    Copied from CRM [id number]. Topic: Clinical - Request for Lab/Test Order >> Aug 18, 2023  1:38 PM Corin V wrote: Reason for CRM: Patient stated that Hyman Hopes wanted her off her meds for 2 weeks to complete a test for her ulcer. Patient unsure what the test was. Please clarify order needed and call patient to schedule

## 2023-08-18 NOTE — Telephone Encounter (Signed)
Called and LVM that she just needs to schedule Lab appt for H Pylori testing. Order placed. Needs to be off Prilosec, Pepcid, and Pepto Bismol for 2 weeks prior.

## 2023-08-26 ENCOUNTER — Other Ambulatory Visit: Payer: Medicaid Other

## 2023-08-27 ENCOUNTER — Other Ambulatory Visit: Payer: Medicaid Other

## 2023-08-27 DIAGNOSIS — R1013 Epigastric pain: Secondary | ICD-10-CM | POA: Diagnosis not present

## 2023-08-27 NOTE — Progress Notes (Signed)
Breath test only

## 2023-08-28 ENCOUNTER — Encounter: Payer: Self-pay | Admitting: Family Medicine

## 2023-08-28 LAB — H. PYLORI BREATH TEST: H. pylori Breath Test: NOT DETECTED

## 2023-08-29 ENCOUNTER — Ambulatory Visit: Payer: Medicaid Other | Admitting: Neurology

## 2023-09-02 NOTE — Telephone Encounter (Signed)
 PA status

## 2023-09-03 ENCOUNTER — Other Ambulatory Visit (HOSPITAL_COMMUNITY): Payer: Self-pay

## 2023-09-03 ENCOUNTER — Telehealth: Payer: Self-pay

## 2023-09-03 NOTE — Telephone Encounter (Signed)
*  [facility]  Pharmacy Patient Advocate Encounter   Received notification from Pt Calls Messages that prior authorization for AJOVY (fremanezumab-vfrm) injection 225MG /1.5ML auto-injectors  is required/requested.   Insurance verification completed.   The patient is insured through [employer] [facility] .   Per test claim: PA required; PA submitted to above mentioned insurance via CoverMyMeds Key/confirmation #/EOC [id number] Status is pending

## 2023-09-03 NOTE — Telephone Encounter (Signed)
 PA request has been Submitted. New Encounter has been or will be created for follow up. For additional info see Pharmacy Prior Auth telephone encounter from 03/05.

## 2023-09-03 NOTE — Telephone Encounter (Signed)
 Pharmacy Patient Advocate Encounter  Received notification from [employer] [facility] that Prior Authorization for AJOVY (fremanezumab-vfrm) injection 225MG /1.5ML auto-injectors  has been APPROVED from 09/03/2023 to 09/01/2024

## 2023-09-04 ENCOUNTER — Ambulatory Visit: Payer: Medicaid Other | Admitting: Podiatry

## 2023-09-05 ENCOUNTER — Other Ambulatory Visit (HOSPITAL_COMMUNITY): Payer: Self-pay

## 2023-09-09 NOTE — Progress Notes (Deleted)
 Subjective:    Patient ID: Lauren Soto, female    DOB: 03/27/1971, 53 y.o.   MRN: [id number]  HPI  Pain Inventory Average Pain {NUMBERS; 0-10:5044} Pain Right Now {NUMBERS; 0-10:5044} My pain is {PAIN DESCRIPTION:21022940}  LOCATION OF PAIN  ***  BOWEL Number of stools per week: *** Oral laxative use {YES/NO:21197} Type of laxative *** Enema or suppository use {YES/NO:21197} History of colostomy {YES/NO:21197} Incontinent {YES/NO:21197}  BLADDER {bladder options:24190} In and out cath, frequency *** Able to self cath {YES/NO:21197} Bladder incontinence {YES/NO:21197} Frequent urination {YES/NO:21197} Leakage with coughing {YES/NO:21197} Difficulty starting stream {YES/NO:21197} Incomplete bladder emptying {YES/NO:21197}   Mobility {MOBILITY WUJ:81191478}  Function {FUNCTION:21022946}  Neuro/Psych {NEURO/PSYCH:21022948}  Prior Studies {CPRM PRIOR STUDIES:21022953}  Physicians involved in your care {CPRM PHYSICIANS INVOLVED IN YOUR CARE:21022954}   Family History  Problem Relation Age of Onset   Neuropathy Mother    Dementia Mother    Hypertension Mother    Multiple myeloma Mother    Arthritis Mother    Dementia Father    Hypertension Father    Social History   Socioeconomic History   Marital status: Married    Spouse name: Not on file   Number of children: Not on file   Years of education: Not on file   Highest education level: Not on file  Occupational History   Not on file  Tobacco Use   Smoking status: Former    Current packs/day: 0.00    Types: Cigarettes    Quit date: 2009    Years since quitting: 16.2   Smokeless tobacco: Never   Tobacco comments:    "Every once in awhile will vape"  Vaping Use   Vaping status: Former   Quit date: 07/04/2022  Substance and Sexual Activity   Alcohol use: No   Drug use: No   Sexual activity: Yes    Birth control/protection: Surgical  Other Topics Concern   Not on file  Social History  Narrative   Are you right handed or left handed? Right   Are you currently employed ? NO   What is your current occupation?   Do you live at home alone?   Who lives with you?    What type of home do you live in: 1 story or 2 story? 1       Social Drivers of Corporate investment banker Strain: Not on file  Food Insecurity: No Food Insecurity (02/03/2023)   Hunger Vital Sign    Worried About Running Out of Food in the Last Year: Never true    Ran Out of Food in the Last Year: Never true  Transportation Needs: No Transportation Needs (02/03/2023)   PRAPARE - Administrator, Civil Service (Medical): No    Lack of Transportation (Non-Medical): No  Physical Activity: Not on file  Stress: Not on file  Social Connections: Not on file   Past Surgical History:  Procedure Laterality Date   ABDOMINAL HYSTERECTOMY     BUNIONECTOMY     CHOLECYSTECTOMY     HERNIA REPAIR     umblical   SHOULDER ARTHROSCOPY     Past Medical History:  Diagnosis Date   Acute cystitis 02/03/2023   AKI (acute kidney injury) (HCC) 02/02/2023   Asthma 04/21/2014   Chronic right hip pain 11/06/2022   Diabetes mellitus without complication (HCC)    Essential hypertension 05/25/2012   GERD (gastroesophageal reflux disease) 02/03/2023   Hyperlipidemia 11/06/2022   Hypertension    Long  term current use of oral hypoglycemic drug 11/06/2022   Menorrhagia 06/08/2012   Non-insulin dependent type 2 diabetes mellitus (HCC) 06/18/2018   Pelvic pain in female 06/08/2012   Postural dizziness with presyncope 02/03/2023   Severe sepsis (HCC) 02/03/2023   Uterine fibroid 05/25/2012   Vertigo    Vestibular migraine 07/06/2018   LMP 06/04/2012   Opioid Risk Score:   Fall Risk Score:  `1  Depression screen [facility] 2/9     03/28/2023    8:55 AM 11/06/2022    3:41 PM  Depression screen PHQ 2/9  Decreased Interest 2 1  Down, Depressed, Hopeless 1 1  PHQ - 2 Score 3 2  Altered sleeping 3 2  Tired, decreased  energy 3 2  Change in appetite 1 2  Feeling bad or failure about yourself  0 0  Trouble concentrating 0 0  Moving slowly or fidgety/restless 2 0  Suicidal thoughts 0 0  PHQ-9 Score 12 8  Difficult doing work/chores Somewhat difficult Somewhat difficult     Review of Systems     Objective:   Physical Exam        Assessment & Plan:

## 2023-09-09 NOTE — Progress Notes (Signed)
 Subjective:    Patient ID: Lauren Soto, female    DOB: February 10, 1971, 53 y.o.   MRN: [id number]  HPI HPI  Lauren Soto is a 53 y.o. year old female  who  has a past medical history of Acute cystitis (02/03/2023), AKI (acute kidney injury) (HCC) (02/02/2023), Asthma (04/21/2014), Chronic right hip pain (11/06/2022), Diabetes mellitus without complication (HCC), Essential hypertension (05/25/2012), GERD (gastroesophageal reflux disease) (02/03/2023), Hyperlipidemia (11/06/2022), Hypertension, Long term current use of oral hypoglycemic drug (11/06/2022), Menorrhagia (06/08/2012), Non-insulin dependent type 2 diabetes mellitus (HCC) (06/18/2018), Pelvic pain in female (06/08/2012), Postural dizziness with presyncope (02/03/2023), Severe sepsis (HCC) (02/03/2023), Uterine fibroid (05/25/2012), Vertigo, and Vestibular migraine (07/06/2018).   They are presenting to PM&R clinic as a new patient for pain management evaluation. They were referred by Dr Christell Constant for treatment of fibromyalgia pain.   Per his last note: Assessment: Patient is a 53 y.o. female with low back pain that radiates into bilateral lower extremities.  It radiates in multiple distributions that are not consistent with her L5/S1 disc herniation.  She also has shoulder pain, neck pain, and multiple other joint pains     Plan: -I went over again that she has a disc herniation that could be affecting the right S1 nerve.  However, she has symptoms in multiple distributions that would not match S1.  She also did not respond to an injection or show any evidence of radiculopathy on EMG/NCS.  Accordingly, I did not recommend surgery as I am not certain it would provide her with any significant benefit but would expose her to potential for complication and risk.  She expressed understanding.  She also relayed to me that she has multiple other joints that hurt and has a history of fibromyalgia.  She is not currently getting treated  for that.  I talked about a referral to pain management, she was interested in trying that.  A referral was provided to her today. -I provided her with refills of Flexeril and Lyrica today since they have given her relief in the past -Patient should return to office on an as-needed basis  Source: RLE primary pain in the hip and back, started as numbness in her right leg; less so bilateral shoulders and L>R knees  Inciting incident: none; r leg pain start out of nowehere in her right thigh and extended up into her hip in 2022.  Description of pain: "achy, throbbing" in all above areas; stiffness when moving. +Numbness and tingling in all joints. + shock like pains in her right hip.  Exacerbating factors: L laying on side, rest, and heat Remitting factors: pain medication, heating pad, and hot bath Red flag symptoms: No red flags for back pain endorsed in Hx or ROS  Medications tried: Topical medications (unsure of effect) : tried lidoderm patches, they wont stay on Nsaids (unsure of effect) : Has been on meloxicam 15 mg daily for a few years Tylenol  (no effect) :  Opiates  (unsure of effect) : Was on tramadol for a bit; "that may have helped". Thinks she took it at nighttime to preven tiredness.  Gabapentin / Lyrica  (mild effect) :  Never tried gabapentin.  On Pregabalin 75 mg once a day; used to be on 100 mg BID; she is insure why it changed. Helps a little bit; she more notices when she does not take it. Denies side effects. Denies renal dz.   TCAs  (never tried) :  SNRIs  (never tried) :  Other  (mild effect) : Flexeril 10 mg; takes once daily, unsure if this is sedating   Other treatments: PT/OT  (no effect) : Most recently 2024; fpr her right hip and low back. Did not help' mostly did bicycle and "trying" to do pushups.  Accupuncture/chiropractor/massage  (no effect) : Dry needling with PT "I can stand" TENs unit (mild effect) : Felt good while it was on, used in PT.  Injections  (no effect) : Had ESI x2, R hip injections with Dr. Christell Constant, without benefit.  Surgery (never tried) :  Other  (   ) :   Goals for pain control: "work without hurting"; she works in [employer] at [employer].   Prior UDS results: No results found for: "LABOPIA", "COCAINSCRNUR", "LABBENZ", "AMPHETMU", "THCU", "LABBARB"   Pain Inventory Average Pain 10 Pain Right Now 8 My pain is constant, sharp, dull, and aching  In the last 24 hours, has pain interfered with the following? General activity 2 Relation with others 0 Enjoyment of life 0 What TIME of day is your pain at its worst? morning  and night Sleep (in general) Poor  Pain is worse with: walking, bending, sitting, inactivity, standing, and some activites Pain improves with: rest, heat/ice, and medication Relief from Meds: 5  walk without assistance ability to climb steps?  yes do you drive?  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  no  Primary care taylor beck np    Family History  Problem Relation Age of Onset   Neuropathy Mother    Dementia Mother    Hypertension Mother    Multiple myeloma Mother    Arthritis Mother    Dementia Father    Hypertension Father    Social History   Socioeconomic History   Marital status: Married    Spouse name: Not on file   Number of children: Not on file   Years of education: Not on file   Highest education level: Not on file  Occupational History   Not on file  Tobacco Use   Smoking status: Former    Current packs/day: 0.00    Types: Cigarettes    Quit date: 2009    Years since quitting: 16.2   Smokeless tobacco: Never   Tobacco comments:    "Every once in awhile will vape"  Vaping Use   Vaping status: Former   Quit date: 07/04/2022  Substance and Sexual Activity   Alcohol use: No   Drug use: No   Sexual activity: Yes     Birth control/protection: Surgical  Other Topics Concern   Not on file  Social History Narrative   Are you right handed or left handed? Right   Are you currently employed ? NO   What is your current occupation?   Do you live at home alone?   Who lives with you?    What type of home do you live in: 1 story or 2 story? 1       Social Drivers of Corporate investment banker Strain: Not on file  Food Insecurity: No Food Insecurity (02/03/2023)   Hunger Vital Sign    Worried About Running Out of Food in the Last Year: Never true    Ran Out of Food in the Last Year: Never true  Transportation Needs: No Transportation Needs (02/03/2023)   PRAPARE - Transportation  Lack of Transportation (Medical): No    Lack of Transportation (Non-Medical): No  Physical Activity: Not on file  Stress: Not on file  Social Connections: Not on file   Past Surgical History:  Procedure Laterality Date   ABDOMINAL HYSTERECTOMY     BUNIONECTOMY     CHOLECYSTECTOMY     HERNIA REPAIR     umblical   SHOULDER ARTHROSCOPY     Past Medical History:  Diagnosis Date   Acute cystitis 02/03/2023   AKI (acute kidney injury) (HCC) 02/02/2023   Asthma 04/21/2014   Chronic right hip pain 11/06/2022   Diabetes mellitus without complication (HCC)    Essential hypertension 05/25/2012   GERD (gastroesophageal reflux disease) 02/03/2023   Hyperlipidemia 11/06/2022   Hypertension    Long term current use of oral hypoglycemic drug 11/06/2022   Menorrhagia 06/08/2012   Non-insulin dependent type 2 diabetes mellitus (HCC) 06/18/2018   Pelvic pain in female 06/08/2012   Postural dizziness with presyncope 02/03/2023   Severe sepsis (HCC) 02/03/2023   Uterine fibroid 05/25/2012   Vertigo    Vestibular migraine 07/06/2018   BP (!) 139/93   Pulse 74   Ht 5' (1.524 m)   Wt 194 lb 9.6 oz (88.3 kg)   LMP 06/04/2012   SpO2 96%   BMI 38.01 kg/m   Opioid Risk Score:   Fall Risk Score:  `1  Depression screen [facility]  2/9     09/10/2023    3:25 PM 03/28/2023    8:55 AM 11/06/2022    3:41 PM  Depression screen PHQ 2/9  Decreased Interest 1 2 1   Down, Depressed, Hopeless 1 1 1   PHQ - 2 Score 2 3 2   Altered sleeping 3 3 2   Tired, decreased energy 3 3 2   Change in appetite 1 1 2   Feeling bad or failure about yourself  0 0 0  Trouble concentrating 2 0 0  Moving slowly or fidgety/restless 1 2 0  Suicidal thoughts 0 0 0  PHQ-9 Score 12 12 8   Difficult doing work/chores  Somewhat difficult Somewhat difficult    Review of Systems  Musculoskeletal:  Positive for myalgias.  Neurological:  Positive for tremors, weakness, numbness and headaches.  All other systems reviewed and are negative.      Objective:   Physical Exam    PE: Constitution: Appropriate appearance for age. No apparent distress +Obese Resp: No respiratory distress. No accessory muscle usage. on RA Cardio: Well perfused appearance. No peripheral edema. Abdomen: Nondistended. Nontender.   Psych: Appropriate mood and affect. Neuro: AAOx4. No apparent cognitive deficits   Neurologic Exam:   DTRs: Reflexes were 2+ in bilateral achilles, patella, biceps, BR and triceps. Babinsky: flexor responses b/l.   Hoffmans: negative b/l Sensory exam: revealed normal sensation in all dermatomal regions in bilateral lower extremities and with reduced sensation to light touch in right low back and lateral hip Coordination: Fine motor coordination was normal.   Gait: normal     MSK: + TTP cervical parapsinals, bilateral trapezius, lumbar paraspinals, R PSIS, R SI joint  Hip Exam: R Inspection: No gross abnormalities on inspection of hip.  No muscle atrophy.  Palpation:  There was no pain with palpation at the: ASIS; AIIS; Hip flexors; Adductors; Greater Troch; ITB.  There was pain at R SI joint and R PSIS; bilateral lumbar paraspinals.   ROM:  Active: dynamic movement of the hip joint wnl. ROM revealed restricted ROM in internal rotation.    Strength: Laying Supine  Rectus Femoris (straight leg)  4/5 Illiospoas (bent knee)  5/5 Internal Rotation   5/5 External Rotation  5/5 Hip Extensors (extend hip with knee straight)    4/5  Special/Provocative tests:  FABERs-  + R SI joint pain with bilateral testing FAIR test -+ R SI joint pain with left testing; no pain on R testing Illiac compression test -  + R SI joint pain Yoemans (sacroillitis) - + R SI joint pain Obers test - - Thomas test (for hip flexion contracture) - + R>L  Information in () parenthesis is normals/details of specific exam.      Assessment & Plan:   Lauren Soto is a 53 y.o. year old female  who  has a past medical history of Acute cystitis (02/03/2023), AKI (acute kidney injury) (HCC) (02/02/2023), Asthma (04/21/2014), Chronic right hip pain (11/06/2022), Diabetes mellitus without complication (HCC), Essential hypertension (05/25/2012), GERD (gastroesophageal reflux disease) (02/03/2023), Hyperlipidemia (11/06/2022), Hypertension, Long term current use of oral hypoglycemic drug (11/06/2022), Menorrhagia (06/08/2012), Non-insulin dependent type 2 diabetes mellitus (HCC) (06/18/2018), Pelvic pain in female (06/08/2012), Postural dizziness with presyncope (02/03/2023), Severe sepsis (HCC) (02/03/2023), Uterine fibroid (05/25/2012), Vertigo, and Vestibular migraine (07/06/2018).   They are presenting to PM&R clinic as a new patient for treatment of Fibromyalgia and R hip/low back pain . They were referred by Dr. Christell Constant .   Chronic right SI joint pain I am referring you to Dr. Wynn Banker for R SI joint injection  Follow up with me in 2 months  Sent work letter [employer] given patient's position in [employer] at [employer], recommending the following; allow to complete stationary tasks while seated if able or if standing allow a seated rest break for at least 20 minutes every 3-4 hours. .   Fibromyalgia Increase Lyrica to 100 mg once a  day. After 2-3 days, if no side effects, add your 75 mg capsules (old prescription) at nighttime. If no side effects after 2-3 days, resume old dosing to 100 mg twice daily.  Chronic right hip pain Use voltaren gel over the counter to the hip, low back, and shoulders up to 4 times daily for topical pain control.   I recommend trying to come off of meloxicam if it is not helpful due to possible renal function and stomach side effects.  Failed R ESI and hip joint injections in the past  Other insomnia Pick the same time to lay down every night, ideally between 8 and 10 PM.    Starting 1 hour before you want to go to sleep, turn off all television screens, phone screens, tablets, and computers.    Keep the lights low and perform only low stimulation activities, such as reading.    Only use your bedroom for sleep and sex. Avoid daytime naps and limit any time spent in your bed that is not dedicated to sleep.   You may also take 3 to 5 mg of over-the-counter melatonin approximately 1 hour before bedtime, or if you are prescribed a medication for sleep take it at this time.  Avoid alcohol, decongestants/pseudoephedrine, antihistamines, caffeine, and nicotine a few hours before bedtime, as these can reduce your quality of sleep.   Other orders -     Pregabalin; Take 1 capsule (100 mg total) by mouth 2 (two) times daily.  Dispense: 60 capsule; Refill: 2

## 2023-09-10 ENCOUNTER — Encounter: Payer: Self-pay | Admitting: Physical Medicine and Rehabilitation

## 2023-09-10 ENCOUNTER — Encounter
Payer: Medicaid Other | Attending: Physical Medicine and Rehabilitation | Admitting: Physical Medicine and Rehabilitation

## 2023-09-10 VITALS — BP 139/93 | HR 74 | Ht 60.0 in | Wt 194.6 lb

## 2023-09-10 DIAGNOSIS — G8929 Other chronic pain: Secondary | ICD-10-CM | POA: Diagnosis not present

## 2023-09-10 DIAGNOSIS — M797 Fibromyalgia: Secondary | ICD-10-CM | POA: Insufficient documentation

## 2023-09-10 DIAGNOSIS — M533 Sacrococcygeal disorders, not elsewhere classified: Secondary | ICD-10-CM | POA: Insufficient documentation

## 2023-09-10 DIAGNOSIS — G4709 Other insomnia: Secondary | ICD-10-CM | POA: Insufficient documentation

## 2023-09-10 DIAGNOSIS — M25551 Pain in right hip: Secondary | ICD-10-CM | POA: Diagnosis not present

## 2023-09-10 MED ORDER — PREGABALIN 100 MG PO CAPS: 100.0000 mg | ORAL_CAPSULE | Freq: Two times a day (BID) | ORAL | 2 refills | Status: DC

## 2023-09-10 NOTE — Patient Instructions (Addendum)
 Increase Lyrica to 100 mg once a day. After 2-3 days, if no side effects, add your 75 mg capsules (old prescription) at nighttime. If no side effects after 2-3 days, resume old dosing to 100 mg twice daily.  Use voltaren gel over the counter to the hip, low back, and shoulders up to 4 times daily for topical pain control. I recommend trying to come off of meloxicam if it is not helpful due to possible renal function and stomach side effects.  I am referring you to Dr. Wynn Banker for R SI joint injection  Follow up with me in 2 months; I will send you a work letter Wylene Simmer

## 2023-10-14 ENCOUNTER — Encounter: Payer: Self-pay | Admitting: Family Medicine

## 2023-10-15 NOTE — Telephone Encounter (Signed)
 We received this and abstracted it into his chart:   MRN: [id number]      Component Ref Range & Units (hover) 2 wk ago  Microalb Creat Ratio 1,541

## 2023-10-31 ENCOUNTER — Encounter: Admitting: Physical Medicine & Rehabilitation

## 2023-11-10 ENCOUNTER — Ambulatory Visit
Admission: EM | Admit: 2023-11-10 | Discharge: 2023-11-10 | Disposition: A | Discharge status: Home / Self Care | Attending: Family Medicine | Admitting: Family Medicine

## 2023-11-10 ENCOUNTER — Ambulatory Visit: Payer: Self-pay | Admitting: Family Medicine

## 2023-11-10 ENCOUNTER — Other Ambulatory Visit: Payer: Self-pay

## 2023-11-10 DIAGNOSIS — M797 Fibromyalgia: Secondary | ICD-10-CM | POA: Diagnosis not present

## 2023-11-10 NOTE — ED Triage Notes (Signed)
 Pt c/o pain in hands bilat. Pt states has been having pain in them for 3 mos, but worse the last mo. Pt states hands feel like they're going to sleep. Pt states the pain gets worse at night and wakes her out of her sleep. Pt states her fingers are swollen and she can't make a fist.

## 2023-11-10 NOTE — Telephone Encounter (Signed)
 Chief Complaint: Hand and arm pain ongoing issue, more noticeable last 3-4 weeks  Symptoms: Tingling especially at night "like going to sleep," knuckles became swollen over weekend, "aching" pain Frequency: Constant pain Pertinent Negatives: Patient denies fever  Disposition: [x] Urgent Care (no appt availability in office)  Additional Notes: Patient advised to go to urgent care today as no appointment availability in office. This RN educated pt on new-worsening symptoms and when to call back/seek emergent care. Pt verbalized understanding and agrees to plan.   Copied from CRM [id number]. Topic: Clinical - Red Word Triage >> Nov 10, 2023 11:53 AM [place] A wrote: [facility] that prompted transfer to Nurse Triage: Patient state that her hands and fingers have been swollen and having pain for the last three weeks. Patient says they ache really bad at night Reason for Disposition  [1] SEVERE pain (e.g., excruciating, unable to use hand at all) AND [2] not improved after 2 hours of pain medicine  Answer Assessment - Initial Assessment Questions ONSET: "When did the pain start?"     Long term issue, more noticeable in last 3-4 weeks LOCATION: "Where is the pain located?"     Both arms and hands PAIN: "How bad is the pain?" (Scale 1-10; or mild, moderate, severe)   - MILD (1-3): doesn't interfere with normal activities   - MODERATE (4-7): interferes with normal activities (e.g., work or school) or awakens from sleep   - SEVERE (8-10): excruciating pain, unable to use hand at all     8/10 right now, over week 10/10 pain; constant pain CAUSE: "What do you think is causing the pain?"     Not sure OTHER SYMPTOMS: "Do you have any other symptoms?" (e.g., neck pain, swelling, rash, numbness, fever)     Intermittent numbness, swelling  Protocols used: Hand and Wrist Pain-A-AH

## 2023-11-10 NOTE — ED Provider Notes (Signed)
 [facility]    CSN: [id number] Arrival date & time: 11/10/23  1521      History   Chief Complaint No chief complaint on file.   HPI Lauren Soto is a 53 y.o. female presents for hand pain and swelling.  Patient has a past medical history of fibromyalgia and presents with several months of bilateral hand pain and swelling that seems to worsen over the past month.  No known injury.  States swelling occurs primarily at night and improves throughout the day.  Denies any numbness tingling or wrist pain.  Does report history of arthritis but denies history of rheumatoid although it does run in her family with her mother.  She states sometimes her knuckles feel more swollen and red.  Patient already takes meloxicam , Lyrica , and Flexeril .  No other concerns at this time.  HPI  Past Medical History:  Diagnosis Date   Acute cystitis 02/03/2023   AKI (acute kidney injury) (HCC) 02/02/2023   Asthma 04/21/2014   Chronic right hip pain 11/06/2022   Diabetes mellitus without complication (HCC)    Essential hypertension 05/25/2012   GERD (gastroesophageal reflux disease) 02/03/2023   Hyperlipidemia 11/06/2022   Hypertension    Long term current use of oral hypoglycemic drug 11/06/2022   Menorrhagia 06/08/2012   Non-insulin  dependent type 2 diabetes mellitus (HCC) 06/18/2018   Pelvic pain in female 06/08/2012   Postural dizziness with presyncope 02/03/2023   Severe sepsis (HCC) 02/03/2023   Uterine fibroid 05/25/2012   Vertigo    Vestibular migraine 07/06/2018    Patient Active Problem List   Diagnosis Date Noted   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   Fibromyalgia 09/10/2023   Other insomnia 09/10/2023   Obesity (BMI 35.0-39.9 without comorbidity) 03/19/2023   Vertigo    Severe sepsis (HCC) 02/03/2023   Postural dizziness with presyncope 02/03/2023   GERD (gastroesophageal reflux disease) 02/03/2023   Acute cystitis 02/03/2023   AKI (acute kidney injury)  (HCC) 02/02/2023   Long term current use of oral hypoglycemic drug 11/06/2022   Chronic right hip pain 11/06/2022   Hyperlipidemia 11/06/2022   Vestibular migraine 07/06/2018   Non-insulin  dependent type 2 diabetes mellitus (HCC) 06/18/2018   Asthma 04/21/2014   Menorrhagia 06/08/2012   Pelvic pain in female 06/08/2012   Essential hypertension 05/25/2012   Uterine fibroid 05/25/2012    Past Surgical History:  Procedure Laterality Date   ABDOMINAL HYSTERECTOMY     BUNIONECTOMY     CHOLECYSTECTOMY     HERNIA REPAIR     umblical   SHOULDER ARTHROSCOPY      OB History     Gravida  2   Para  2   Term  2   Preterm  0   AB  0   Living  2      SAB  0   IAB  0   Ectopic  0   Multiple  0   Live Births               Home Medications    Prior to Admission medications   Medication Sig Start Date End Date Taking? Authorizing Provider  AJOVY  225 MG/1.5ML SOAJ Inject into the skin. 04/19/23   [provider]  atorvastatin  (LIPITOR) 20 MG tablet Take 1 tablet (20 mg total) by mouth daily. 06/04/23   Everlina Hock, NP  cyclobenzaprine  (FLEXERIL ) 10 MG tablet Take 1 tablet (10 mg total) by mouth 3 (three) times daily as needed for  muscle spasms. 07/24/23   Diedra Fowler, MD  diclofenac Sodium (VOLTAREN) 1 % GEL Apply 2 g topically 4 (four) times daily as needed (pain).    [provider]  dicyclomine  (BENTYL ) 20 MG tablet Take 0.5 tablets (10 mg total) by mouth 2 (two) times daily as needed for spasms. 08/04/23   Everlina Hock, NP  famotidine  (PEPCID ) 20 MG tablet Take 1 tablet (20 mg total) by mouth 2 (two) times daily. 08/04/23   Everlina Hock, NP  hydrochlorothiazide  (HYDRODIURIL ) 12.5 MG tablet Take 1 tablet (12.5 mg total) by mouth daily. 06/04/23   Everlina Hock, NP  Insulin  Pen Needle (NOVOFINE PEN NEEDLE) 32G X 6 MM MISC 1 Device by Does not apply route daily. To use with Victoza  07/23/23   Everlina Hock, NP  liraglutide  (VICTOZA ) 18 MG/3ML  SOPN Start 0.6mg  SQ once a day for 7 days, then increase to 1.2mg  once a day 06/16/23   Everlina Hock, NP  lisinopril  (ZESTRIL ) 40 MG tablet Take 1 tablet (40 mg total) by mouth daily. 06/04/23   Everlina Hock, NP  meclizine  (ANTIVERT ) 25 MG tablet Take 1 tablet (25 mg total) by mouth 3 (three) times daily as needed for dizziness. 02/25/23   Merriam Abbey, DO  meloxicam  (MOBIC ) 15 MG tablet Take 1 tablet (15 mg total) by mouth daily. 06/04/23   Everlina Hock, NP  metFORMIN  (GLUCOPHAGE ) 850 MG tablet Take 1 tablet (850 mg total) by mouth 2 (two) times daily. 06/04/23   Everlina Hock, NP  metoprolol  succinate (TOPROL -XL) 50 MG 24 hr tablet Take 1 tablet (50 mg total) by mouth daily. Take with or immediately following a meal. 06/04/23 11/07/23  Everlina Hock, NP  nitroGLYCERIN  (NITROSTAT ) 0.4 MG SL tablet Place 1 tablet (0.4 mg total) under the tongue every 5 (five) minutes as needed for chest pain. 02/27/23   Everlina Hock, NP  omeprazole  (PRILOSEC) 20 MG capsule Take 1 capsule (20 mg total) by mouth every morning. 12/02/22   Everlina Hock, NP  ondansetron  (ZOFRAN -ODT) 4 MG disintegrating tablet Take 1 tablet (4 mg total) by mouth every 8 (eight) hours as needed. 02/25/23   Merriam Abbey, DO  pregabalin  (LYRICA ) 100 MG capsule Take 1 capsule (100 mg total) by mouth 2 (two) times daily. 09/10/23   Bea Lime, DO  rizatriptan  (MAXALT -MLT) 10 MG disintegrating tablet Take 1 tablet (10 mg total) by mouth as needed for migraine (May repeat in 2 hours.  Maximum 2 tablets in 24 hours.). May repeat in 2 hours if needed 02/25/23   Merriam Abbey, DO  urea  (CARMOL) 10 % cream Apply topically as needed. 04/04/23   Standiford, Karlene Overcast, DPM    Family History Family History  Problem Relation Age of Onset   Neuropathy Mother    Dementia Mother    Hypertension Mother    Multiple myeloma Mother    Arthritis Mother    Dementia Father    Hypertension Father     Social History Social History   Tobacco Use    Smoking status: Former    Current packs/day: 0.00    Types: Cigarettes    Quit date: 2009    Years since quitting: 16.3   Smokeless tobacco: Never   Tobacco comments:    "Every once in awhile will vape"  Vaping Use   Vaping status: Former   Quit date: 07/04/2022  Substance Use Topics   Alcohol use: No  Drug use: No     Allergies   Patient has no known allergies.   Review of Systems Review of Systems  Musculoskeletal:        Bilateral hand pain and swelling times several months     Physical Exam Triage Vital Signs ED Triage Vitals  Encounter Vitals Group     BP 11/10/23 1558 119/85     Systolic BP Percentile --      Diastolic BP Percentile --      Pulse Rate 11/10/23 1558 93     Resp 11/10/23 1558 17     Temp 11/10/23 1558 98.6 F (37 C)     Temp Source 11/10/23 1558 Oral     SpO2 11/10/23 1558 94 %     Weight --      Height --      Head Circumference --      Peak Flow --      Pain Score 11/10/23 1555 8     Pain Loc --      Pain Education --      Exclude from Growth Chart --    No data found.  Updated Vital Signs BP 119/85   Pulse 93   Temp 98.6 F (37 C) (Oral)   Resp 17   LMP 06/04/2012   SpO2 94%   Visual Acuity Right Eye Distance:   Left Eye Distance:   Bilateral Distance:    Right Eye Near:   Left Eye Near:    Bilateral Near:     Physical Exam Vitals and nursing note reviewed.  Constitutional:      General: She is not in acute distress.    Appearance: Normal appearance. She is not ill-appearing.  HENT:     Head: Normocephalic and atraumatic.  Eyes:     Pupils: Pupils are equal, round, and reactive to light.  Cardiovascular:     Rate and Rhythm: Normal rate.  Pulmonary:     Effort: Pulmonary effort is normal.  Musculoskeletal:     Right hand: Tenderness present. No swelling, deformity, lacerations or bony tenderness. Normal range of motion. Normal strength. Normal sensation. Normal capillary refill. Normal pulse.     Left hand:  Tenderness present. No swelling, deformity, lacerations or bony tenderness. Normal range of motion. Normal strength. Normal sensation. Normal capillary refill.     Comments: No obvious swelling of bilateral hands.  There is tenderness with palpation to the entire dorsum and palmar palmar aspect of the hands and fingers.  There is no joint swelling or erythema.  Negative phalen test and tinel test  Skin:    General: Skin is warm and dry.  Neurological:     General: No focal deficit present.     Mental Status: She is alert and oriented to person, place, and time.  Psychiatric:        Mood and Affect: Mood normal.        Behavior: Behavior normal.      UC Treatments / Results  Labs (all labs ordered are listed, but only abnormal results are displayed) Labs Reviewed - No data to display  EKG   Radiology No results found.  Procedures Procedures (including critical care time)  Medications Ordered in UC Medications - No data to display  Initial Impression / Assessment and Plan / UC Course  I have reviewed the triage vital signs and the nursing notes.  Pertinent labs & imaging results that were available during my care of the patient were reviewed  by me and considered in my medical decision making (see chart for details).     Reviewed exam and symptoms with patient.  No red flags.  Discussed symptoms likely secondary to fibromyalgia and/or arthritis.  She is already on meloxicam , Flexeril , and Lyrica .  Will do wrist brace laterally at night to see if that helps support her hands and wrists and have her elevate them at night.  She can follow-up with her PCP for further treatment options.  ER precautions reviewed and patient verbalized understanding. Final Clinical Impressions(s) / UC Diagnoses   Final diagnoses:  Fibromyalgia     Discharge Instructions      You may use the wrist splints at night to help stabilize the wrist and hopefully reduce swelling.  Sleep with your hands  elevated if possible.  Continue meloxicam  and Flexeril  as needed.  Follow-up with your PCP for further workup of arthritis as it runs in your family.  Please go to the ER for any worsening symptoms.  Hope you feel better soon!  ED Prescriptions   None    PDMP not reviewed this encounter.   Alleen Arbour, NP 11/10/23 [phone]

## 2023-11-10 NOTE — Discharge Instructions (Signed)
 You may use the wrist splints at night to help stabilize the wrist and hopefully reduce swelling.  Sleep with your hands elevated if possible.  Continue meloxicam  and Flexeril  as needed.  Follow-up with your PCP for further workup of arthritis as it runs in your family.  Please go to the ER for any worsening symptoms.  Hope you feel better soon!

## 2023-11-17 ENCOUNTER — Encounter: Admitting: Physical Medicine and Rehabilitation
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
## 2023-11-21 ENCOUNTER — Encounter: Payer: Self-pay | Admitting: Neurology

## 2023-11-21 ENCOUNTER — Ambulatory Visit (INDEPENDENT_AMBULATORY_CARE_PROVIDER_SITE_OTHER): Admitting: Neurology

## 2023-11-21 VITALS — BP 138/90 | HR 105 | Ht 60.0 in | Wt 201.0 lb

## 2023-11-21 DIAGNOSIS — G43809 Other migraine, not intractable, without status migrainosus: Secondary | ICD-10-CM | POA: Diagnosis not present

## 2023-11-21 MED ORDER — SUMATRIPTAN SUCCINATE 100 MG PO TABS: 100.0000 mg | ORAL_TABLET | ORAL | 5 refills | Status: DC | PRN

## 2023-11-21 MED ORDER — MECLIZINE HCL 25 MG PO TABS: 25.0000 mg | ORAL_TABLET | Freq: Three times a day (TID) | ORAL | 5 refills | Status: DC | PRN

## 2023-11-21 MED ORDER — ONDANSETRON 4 MG PO TBDP: 4.0000 mg | ORAL_TABLET | Freq: Three times a day (TID) | ORAL | 5 refills | Status: DC | PRN

## 2023-11-21 MED ORDER — QULIPTA 60 MG PO TABS: 60.0000 mg | ORAL_TABLET | Freq: Every day | ORAL | 11 refills | Status: DC

## 2023-11-21 NOTE — Progress Notes (Signed)
 Medication Samples have been provided to the patient.  Drug name: Lauren Soto       Strength: 60 mg        Qty: 8  LOT: [id number]  Exp.Date: 5/27  Dosing instructions: daily  The patient has been instructed regarding the correct time, dose, and frequency of taking this medication, including desired effects and most common side effects.   Lauren Soto 3:50 PM 11/21/2023

## 2023-11-21 NOTE — Patient Instructions (Signed)
 Stop Ajovy .  Plan to start Qulipta 60mg  pill once daily.  If no improvement in 3 months, contact me. Stop rizatriptan .  Start sumatriptan with the Zofran  and meclizine  for migraine attacks Limit use of pain relievers to no more than 9 days out of the month to prevent risk of rebound or medication-overuse headache. Keep headache diary Follow up 6 months.

## 2023-11-24 ENCOUNTER — Other Ambulatory Visit: Payer: Self-pay | Admitting: Orthopedic Surgery

## 2023-11-25 ENCOUNTER — Telehealth: Payer: Self-pay

## 2023-11-25 NOTE — Telephone Encounter (Signed)
 PA needed for qulipta

## 2023-11-26 ENCOUNTER — Other Ambulatory Visit (HOSPITAL_COMMUNITY): Payer: Self-pay

## 2023-11-26 ENCOUNTER — Telehealth: Payer: Self-pay

## 2023-11-26 ENCOUNTER — Telehealth: Payer: Self-pay | Admitting: Pharmacist

## 2023-11-26 NOTE — Telephone Encounter (Signed)
 Pharmacy Patient Advocate Encounter  Insurance verification completed.   The patient is insured through [employer]   Ran test claim for Qulipta . Currently a quantity of 30 is a 30 day supply and the co-pay is 0.00 . The current 30 day co-pay is, $0.  No PA needed at this time.  This test claim was processed through [facility]- copay amounts may vary at other pharmacies due to pharmacy/plan contracts, or as the patient moves through the different stages of their insurance plan.

## 2023-11-26 NOTE — Telephone Encounter (Signed)
 Pharmacy Patient Advocate Encounter  Received notification from [facility] that Prior Authorization for Liraglutide  18MG /3ML pen-injectors has been APPROVED Ran test claim, Copay is $15.00  This test claim was processed through [facility]- copay amounts may vary at other pharmacies due to pharmacy/plan contracts, or as the patient moves through the different stages of their insurance plan.  SEE TEST CLAIM BELOW

## 2023-11-26 NOTE — Telephone Encounter (Signed)
 PA not needed. Please see 5.28.25 encounter.

## 2023-11-27 ENCOUNTER — Ambulatory Visit (INDEPENDENT_AMBULATORY_CARE_PROVIDER_SITE_OTHER): Admitting: Family Medicine

## 2023-11-27 ENCOUNTER — Encounter: Payer: Self-pay | Admitting: Family Medicine

## 2023-11-27 VITALS — BP 128/62 | HR 90 | Ht 60.0 in | Wt 200.0 lb

## 2023-11-27 DIAGNOSIS — M2550 Pain in unspecified joint: Secondary | ICD-10-CM | POA: Diagnosis not present

## 2023-11-27 DIAGNOSIS — E119 Type 2 diabetes mellitus without complications: Secondary | ICD-10-CM | POA: Diagnosis not present

## 2023-11-27 MED ORDER — BLOOD GLUCOSE TEST VI STRP: 1.0000 | ORAL_STRIP | Freq: Three times a day (TID) | 0 refills | Status: AC

## 2023-11-27 MED ORDER — PREDNISONE 20 MG PO TABS: 20.0000 mg | ORAL_TABLET | Freq: Every day | ORAL | 0 refills | Status: DC

## 2023-11-27 MED ORDER — BLOOD GLUCOSE MONITORING SUPPL DEVI: 1.0000 | Freq: Three times a day (TID) | 0 refills | Status: AC

## 2023-11-27 MED ORDER — LANCET DEVICE MISC: 1.0000 | Freq: Three times a day (TID) | 0 refills | Status: AC

## 2023-11-27 MED ORDER — LANCETS MISC. MISC: 1.0000 | Freq: Three times a day (TID) | 0 refills | Status: AC

## 2023-11-27 NOTE — Progress Notes (Signed)
 Acute Office Visit  Subjective:     Patient ID: AMA MCMASTER, female    DOB: 07-14-1970, 53 y.o.   MRN: [id number]  Chief Complaint  Patient presents with   Medical Management of Chronic Issues    HPI Patient is in today for urgent care follow-up, joint pain.   Discussed the use of AI scribe software for clinical note transcription with the patient, who gave verbal consent to proceed.  History of Present Illness Lauren Soto is a 53 year old female with fibromyalgia who presents with bilateral hand numbness, swelling, and pain radiating to the arms and shoulders.  She experiences numbness in both hands, alternating between sides and sometimes affecting both simultaneously, particularly at night. This has been ongoing for a while. During a recent urgent care visit, she experienced swelling in her hands along with the numbness.  The pain radiates from her hands to her elbows and shoulders, with particular discomfort around her wrists. The pain is severe enough to wake her from sleep, as noted last night when her left wrist was numb and swollen.  She has a history of fibromyalgia. Her mother had rheumatoid arthritis, which is relevant to her current symptoms. She was advised by urgent care to be checked for rheumatoid arthritis.  She experiences stiffness and difficulty gripping objects, stating 'when I try to make a grip, it's hard.'   Current medications include cyclobenzaprine , which she has been taking for a while, and meloxicam , which she uses sparingly due to kidney concerns. She was also prescribed Lyrica  in March.       ROS All review of systems negative except what is listed in the HPI      Objective:    BP 128/62   Pulse 90   Ht 5' (1.524 m)   Wt 200 lb (90.7 kg)   LMP 06/04/2012   SpO2 100%   BMI 39.06 kg/m    Physical Exam Vitals reviewed.  Constitutional:      Appearance: Normal appearance.  Musculoskeletal:        General: No  swelling or tenderness. Normal range of motion.     Comments: Negative Tinel's, positive Phalen's bilaterally  Skin:    General: Skin is warm and dry.  Neurological:     Mental Status: She is alert and oriented to person, place, and time.  Psychiatric:        Mood and Affect: Mood normal.        Behavior: Behavior normal.        Thought Content: Thought content normal.     No results found for any visits on 11/27/23.      Assessment & Plan:   Problem List Items Addressed This Visit       Active Problems   Non-insulin  dependent type 2 diabetes mellitus (HCC)   Labs today; new monitor sent in      Relevant Medications   Blood Glucose Monitoring Suppl DEVI   Glucose Blood (BLOOD GLUCOSE TEST STRIPS) STRP   Lancet Device MISC   Lancets Misc. MISC   Other Relevant Orders   Hemoglobin A1c   Polyarthralgia - Primary   Intermittent pain, numbness, and swelling in hands, wrists, elbows, and shoulders, worse at night. Differential includes fibromyalgia, rheumatoid arthritis, cervical radiculopathy, and carpal tunnel syndrome. Family history of rheumatoid arthritis. Symptoms impact daily activities. - Order ANA and rheumatoid factor tests. - Consider x-ray of neck and worst joints if blood tests are negative. - Prescribe prednisone  for  5 days - monitor BP and glucose. - Advise to avoid meloxicam , ibuprofen, and Aleve while on prednisone.      Relevant Medications   predniSONE (DELTASONE) 20 MG tablet   Other Relevant Orders   Antinuclear Antib (ANA)   Rheumatoid Factor   CBC with Differential/Platelet   Comprehensive metabolic panel with GFR   B12 and Folate Panel      Meds ordered this encounter  Medications   predniSONE (DELTASONE) 20 MG tablet    Sig: Take 1 tablet (20 mg total) by mouth daily with breakfast.    Dispense:  5 tablet    Refill:  0    Supervising Provider:   Randie Bustle A [[id number]]   Blood Glucose Monitoring Suppl DEVI    Sig: 1 each by Does not  apply route in the morning, at noon, and at bedtime. May substitute to any manufacturer covered by patient's insurance.    Dispense:  1 each    Refill:  0    Supervising Provider:   Randie Bustle A [[id number]]   Glucose Blood (BLOOD GLUCOSE TEST STRIPS) STRP    Sig: 1 each by In Vitro route in the morning, at noon, and at bedtime. May substitute to any manufacturer covered by patient's insurance.    Dispense:  100 strip    Refill:  0    Supervising Provider:   Randie Bustle A [[id number]]   Lancet Device MISC    Sig: 1 each by Does not apply route in the morning, at noon, and at bedtime. May substitute to any manufacturer covered by patient's insurance.    Dispense:  1 each    Refill:  0    Supervising Provider:   Randie Bustle A [[id number]]   Lancets Misc. MISC    Sig: 1 each by Does not apply route in the morning, at noon, and at bedtime. May substitute to any manufacturer covered by patient's insurance.    Dispense:  100 each    Refill:  0    Supervising Provider:   Randie Bustle A [[id number]]    Return for / pending labs.  Everlina Hock, NP

## 2023-11-27 NOTE — Assessment & Plan Note (Signed)
 Labs today; new monitor sent in

## 2023-11-27 NOTE — Assessment & Plan Note (Signed)
 Intermittent pain, numbness, and swelling in hands, wrists, elbows, and shoulders, worse at night. Differential includes fibromyalgia, rheumatoid arthritis, cervical radiculopathy, and carpal tunnel syndrome. Family history of rheumatoid arthritis. Symptoms impact daily activities. - Order ANA and rheumatoid factor tests. - Consider x-ray of neck and worst joints if blood tests are negative. - Prescribe prednisone for 5 days - monitor BP and glucose. - Advise to avoid meloxicam , ibuprofen, and Aleve while on prednisone.

## 2023-11-28 LAB — CBC WITH DIFFERENTIAL/PLATELET
Basophils Absolute: 0.1 10*3/uL (ref 0.0–0.1)
Basophils Relative: 1 % (ref 0.0–3.0)
Eosinophils Absolute: 0.1 10*3/uL (ref 0.0–0.7)
Eosinophils Relative: 1.5 % (ref 0.0–5.0)
HCT: 35.6 % — ABNORMAL LOW (ref 36.0–46.0)
Hemoglobin: 11.8 g/dL — ABNORMAL LOW (ref 12.0–15.0)
Lymphocytes Relative: 33.8 % (ref 12.0–46.0)
Lymphs Abs: 3 10*3/uL (ref 0.7–4.0)
MCHC: 33.1 g/dL (ref 30.0–36.0)
MCV: 84.1 fl (ref 78.0–100.0)
Monocytes Absolute: 0.7 10*3/uL (ref 0.1–1.0)
Monocytes Relative: 7.7 % (ref 3.0–12.0)
Neutro Abs: 4.9 10*3/uL (ref 1.4–7.7)
Neutrophils Relative %: 56 % (ref 43.0–77.0)
Platelets: 348 10*3/uL (ref 150.0–400.0)
RBC: 4.23 Mil/uL (ref 3.87–5.11)
RDW: 14.9 % (ref 11.5–15.5)
WBC: 8.8 10*3/uL (ref 4.0–10.5)

## 2023-11-28 LAB — COMPREHENSIVE METABOLIC PANEL WITH GFR
ALT: 13 U/L (ref 0–35)
AST: 15 U/L (ref 0–37)
Albumin: 4 g/dL (ref 3.5–5.2)
Alkaline Phosphatase: 139 U/L — ABNORMAL HIGH (ref 39–117)
BUN: 15 mg/dL (ref 6–23)
CO2: 29 meq/L (ref 19–32)
Calcium: 9.9 mg/dL (ref 8.4–10.5)
Chloride: 100 meq/L (ref 96–112)
Creatinine, Ser: 0.84 mg/dL (ref 0.40–1.20)
GFR: 79.98 mL/min (ref >60.00)
Glucose, Bld: 97 mg/dL (ref 70–99)
Potassium: 4 meq/L (ref 3.5–5.1)
Sodium: 139 meq/L (ref 135–145)
Total Bilirubin: 0.3 mg/dL (ref 0.2–1.2)
Total Protein: 7.2 g/dL (ref 6.0–8.3)

## 2023-11-28 LAB — B12 AND FOLATE PANEL
Folate: 6.2 ng/mL (ref >5.9)
Vitamin B-12: 276 pg/mL (ref 211–911)

## 2023-11-28 LAB — HEMOGLOBIN A1C: Hgb A1c MFr Bld: 7 % — ABNORMAL HIGH (ref 4.6–6.5)

## 2023-11-29 LAB — ANA: Anti Nuclear Antibody (ANA): POSITIVE — AB

## 2023-11-29 LAB — RHEUMATOID FACTOR: Rheumatoid fact SerPl-aCnc: <10 [IU]/mL (ref <14)

## 2023-11-29 LAB — ANTI-NUCLEAR AB-TITER (ANA TITER): ANA Titer 1: 1:40 {titer} — ABNORMAL HIGH

## 2023-12-01 ENCOUNTER — Encounter: Payer: Self-pay | Admitting: Family Medicine

## 2023-12-01 ENCOUNTER — Ambulatory Visit: Payer: Self-pay | Admitting: Family Medicine

## 2023-12-01 DIAGNOSIS — M2550 Pain in unspecified joint: Secondary | ICD-10-CM

## 2023-12-01 DIAGNOSIS — R768 Other specified abnormal immunological findings in serum: Secondary | ICD-10-CM

## 2023-12-02 ENCOUNTER — Ambulatory Visit: Admitting: Physical Medicine & Rehabilitation

## 2023-12-10 ENCOUNTER — Other Ambulatory Visit (HOSPITAL_COMMUNITY): Payer: Self-pay

## 2023-12-15 ENCOUNTER — Ambulatory Visit: Payer: Medicaid Other | Admitting: Neurology

## 2023-12-22 ENCOUNTER — Other Ambulatory Visit (HOSPITAL_COMMUNITY): Payer: Self-pay

## 2023-12-24 ENCOUNTER — Encounter: Payer: Self-pay | Admitting: Family Medicine

## 2023-12-24 ENCOUNTER — Ambulatory Visit: Admitting: Family Medicine

## 2023-12-24 VITALS — BP 115/80 | HR 103 | Temp 97.7°F | Ht 60.0 in | Wt 190.6 lb

## 2023-12-24 DIAGNOSIS — J029 Acute pharyngitis, unspecified: Secondary | ICD-10-CM

## 2023-12-24 DIAGNOSIS — J020 Streptococcal pharyngitis: Secondary | ICD-10-CM

## 2023-12-24 LAB — POCT RAPID STREP A (OFFICE): Rapid Strep A Screen: POSITIVE — AB

## 2023-12-24 LAB — POC COVID19 BINAXNOW: SARS Coronavirus 2 Ag: NEGATIVE

## 2023-12-24 MED ORDER — AMOXICILLIN 500 MG PO CAPS: 500.0000 mg | ORAL_CAPSULE | Freq: Two times a day (BID) | ORAL | 0 refills | Status: AC

## 2023-12-24 NOTE — Progress Notes (Signed)
 Acute Office Visit  Subjective:     Patient ID: Lauren Soto, female    DOB: 05-Nov-1970, 53 y.o.   MRN: [id number]  Chief Complaint  Patient presents with   Nasal Congestion   Sore Throat    saturday   Extremity Weakness    Legs this morning   Night Sweats   Tachycardia    Since last week     Patient is in today for sore throat, malaise. Here with her daughter.   Discussed the use of AI scribe software for clinical note transcription with the patient, who gave verbal consent to proceed.  History of Present Illness Lauren Soto is a 53 year old female who presents with a sore throat, fatigue, and racing heart.  She has been experiencing a racing heart for the past two weeks, coinciding with the hospitalization and subsequent passing of her best friend. She feels stressed and anxious during this period, and despite the emotional stress, the racing heart has persisted.  She developed a sore throat on Saturday, which is painful, especially when swallowing. The sore throat is constant but worsens with swallowing. She has not taken any medications for this.  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 She felt weak in her legs and had to frequently cool off in the freezer. She did not check her temperature but suspects she may have had a fever. She also reports fatigue and decreased stamina.  She has a cough and describes her nasal symptoms as congestion without a runny nose, noting a mucousy sensation. No shortness of breath, but she feels winded easily. She also reports watery eyes, which have occurred in previous years. An eye doctor previously suggested this might be due to allergies.  She denies being around anyone known to be sick recently and has not experienced ear pain. She reports occasional nausea but has not vomited.         All review of systems negative except  what is listed in the HPI      Objective:    BP 115/80   Pulse (!) 103   Temp 97.7 F (36.5 C)   Ht 5' (1.524 m)   Wt 190 lb 9.6 oz (86.5 kg)   LMP 06/04/2012   SpO2 98%   BMI 37.22 kg/m    Physical Exam Vitals reviewed.  Constitutional:      General: She is not in acute distress.    Appearance: Normal appearance. She is obese. She is ill-appearing.  HENT:     Head: Normocephalic and atraumatic.     Right Ear: Tympanic membrane normal.     Left Ear: Tympanic membrane normal.     Nose: Congestion present.     Mouth/Throat:     Mouth: Mucous membranes are moist.     Pharynx: Oropharynx is clear. Posterior oropharyngeal erythema present.   Eyes:     Conjunctiva/sclera: Conjunctivae normal.     Comments: Watering bilaterally    Cardiovascular:     Rate and Rhythm: Regular rhythm. Tachycardia present.  Pulmonary:     Effort: Pulmonary effort is normal.     Breath sounds: Normal breath sounds.   Musculoskeletal:     Cervical back: Normal range of motion and neck supple. No tenderness.  Lymphadenopathy:     Cervical: No cervical adenopathy.   Skin:    General: Skin is warm and dry.   Neurological:  Mental Status: She is alert and oriented to person, place, and time.   Psychiatric:        Mood and Affect: Mood normal.        Behavior: Behavior normal.        Thought Content: Thought content normal.        Judgment: Judgment normal.       Results for orders placed or performed in visit on 12/24/23  POCT rapid strep A  Result Value Ref Range   Rapid Strep A Screen Positive (A) Negative  POC COVID-19  Result Value Ref Range   SARS Coronavirus 2 Ag Negative Negative        Assessment & Plan:   Problem List Items Addressed This Visit   None Visit Diagnoses       Strep pharyngitis    -  Primary   Relevant Medications   amoxicillin  (AMOXIL ) 500 MG capsule     Sore throat       Relevant Orders   POCT rapid strep A (Completed)   POC COVID-19  (Completed)       Assessment & Plan Streptococcal pharyngitis Symptoms and positive strep test confirm diagnosis. Elevated heart rate likely due to infection.  - Prescribe amoxicillin  twice daily with food and water. - Recommend probiotic, not within two hours of antibiotic. - Advise increased fluid intake and warm liquids with honey and lemon. - Advise monitoring for dehydration and seek medical attention if symptoms worsen.  Anxiety and stress reaction Increased heart rate and stress linked to friend's death. Emotional stress may worsen physical symptoms. - Encourage rest and time off work.    Meds ordered this encounter  Medications   amoxicillin  (AMOXIL ) 500 MG capsule    Sig: Take 1 capsule (500 mg total) by mouth 2 (two) times daily for 10 days.    Dispense:  20 capsule    Refill:  0    Supervising Provider:   DOMENICA BLACKBIRD A [[id number]]    Return if symptoms worsen or fail to improve.  Waddell KATHEE Mon, NP

## 2023-12-29 ENCOUNTER — Other Ambulatory Visit: Payer: Self-pay | Admitting: Family Medicine

## 2023-12-29 DIAGNOSIS — R1013 Epigastric pain: Secondary | ICD-10-CM

## 2024-01-05 ENCOUNTER — Encounter (HOSPITAL_BASED_OUTPATIENT_CLINIC_OR_DEPARTMENT_OTHER): Payer: Self-pay | Admitting: Emergency Medicine

## 2024-01-05 ENCOUNTER — Other Ambulatory Visit: Payer: Self-pay

## 2024-01-05 ENCOUNTER — Emergency Department (HOSPITAL_BASED_OUTPATIENT_CLINIC_OR_DEPARTMENT_OTHER)
Admission: EM | Admit: 2024-01-05 | Discharge: 2024-01-05 | Disposition: A | Discharge status: Home / Self Care | Attending: Emergency Medicine | Admitting: Emergency Medicine

## 2024-01-05 ENCOUNTER — Emergency Department (HOSPITAL_BASED_OUTPATIENT_CLINIC_OR_DEPARTMENT_OTHER)

## 2024-01-05 ENCOUNTER — Ambulatory Visit
Admission: EM | Admit: 2024-01-05 | Discharge: 2024-01-05 | Disposition: A | Discharge status: Other Acute Inpatient Hospital | Attending: Family Medicine | Admitting: Family Medicine

## 2024-01-05 DIAGNOSIS — R748 Abnormal levels of other serum enzymes: Secondary | ICD-10-CM | POA: Insufficient documentation

## 2024-01-05 DIAGNOSIS — E1165 Type 2 diabetes mellitus with hyperglycemia: Secondary | ICD-10-CM | POA: Diagnosis not present

## 2024-01-05 DIAGNOSIS — D72829 Elevated white blood cell count, unspecified: Secondary | ICD-10-CM | POA: Insufficient documentation

## 2024-01-05 DIAGNOSIS — R1013 Epigastric pain: Secondary | ICD-10-CM

## 2024-01-05 DIAGNOSIS — R7402 Elevation of levels of lactic acid dehydrogenase (LDH): Secondary | ICD-10-CM | POA: Insufficient documentation

## 2024-01-05 DIAGNOSIS — K219 Gastro-esophageal reflux disease without esophagitis: Secondary | ICD-10-CM

## 2024-01-05 DIAGNOSIS — I10 Essential (primary) hypertension: Secondary | ICD-10-CM | POA: Diagnosis not present

## 2024-01-05 DIAGNOSIS — R6510 Systemic inflammatory response syndrome (SIRS) of non-infectious origin without acute organ dysfunction: Secondary | ICD-10-CM | POA: Diagnosis not present

## 2024-01-05 DIAGNOSIS — Z7984 Long term (current) use of oral hypoglycemic drugs: Secondary | ICD-10-CM | POA: Insufficient documentation

## 2024-01-05 DIAGNOSIS — R109 Unspecified abdominal pain: Secondary | ICD-10-CM | POA: Diagnosis present

## 2024-01-05 DIAGNOSIS — Z79899 Other long term (current) drug therapy: Secondary | ICD-10-CM | POA: Insufficient documentation

## 2024-01-05 DIAGNOSIS — R000 Tachycardia, unspecified: Secondary | ICD-10-CM | POA: Diagnosis not present

## 2024-01-05 DIAGNOSIS — Z794 Long term (current) use of insulin: Secondary | ICD-10-CM | POA: Insufficient documentation

## 2024-01-05 LAB — COMPREHENSIVE METABOLIC PANEL WITH GFR
ALT: 18 U/L (ref 0–44)
AST: 24 U/L (ref 15–41)
Albumin: 4.6 g/dL (ref 3.5–5.0)
Alkaline Phosphatase: 184 U/L — ABNORMAL HIGH (ref 38–126)
Anion gap: 18 — ABNORMAL HIGH (ref 5–15)
BUN: 13 mg/dL (ref 6–20)
CO2: 22 mmol/L (ref 22–32)
Calcium: 10.3 mg/dL (ref 8.9–10.3)
Chloride: 97 mmol/L — ABNORMAL LOW (ref 98–111)
Creatinine, Ser: 0.87 mg/dL (ref 0.44–1.00)
GFR, Estimated: >60 mL/min (ref >60)
Glucose, Bld: 131 mg/dL — ABNORMAL HIGH (ref 70–99)
Potassium: 3.7 mmol/L (ref 3.5–5.1)
Sodium: 137 mmol/L (ref 135–145)
Total Bilirubin: 0.5 mg/dL (ref 0.0–1.2)
Total Protein: 8 g/dL (ref 6.5–8.1)

## 2024-01-05 LAB — URINALYSIS, ROUTINE W REFLEX MICROSCOPIC
Bilirubin Urine: NEGATIVE
Glucose, UA: NEGATIVE mg/dL
Hgb urine dipstick: NEGATIVE
Ketones, ur: NEGATIVE mg/dL
Leukocytes,Ua: NEGATIVE
Nitrite: NEGATIVE
Protein, ur: NEGATIVE mg/dL
Specific Gravity, Urine: 1.01 (ref 1.005–1.030)
pH: 7 (ref 5.0–8.0)

## 2024-01-05 LAB — CBC
HCT: 41.2 % (ref 36.0–46.0)
Hemoglobin: 14.2 g/dL (ref 12.0–15.0)
MCH: 27.8 pg (ref 26.0–34.0)
MCHC: 34.5 g/dL (ref 30.0–36.0)
MCV: 80.8 fL (ref 80.0–100.0)
Platelets: 434 K/uL — ABNORMAL HIGH (ref 150–400)
RBC: 5.1 MIL/uL (ref 3.87–5.11)
RDW: 14.1 % (ref 11.5–15.5)
WBC: 17.5 K/uL — ABNORMAL HIGH (ref 4.0–10.5)
nRBC: 0 % (ref 0.0–0.2)

## 2024-01-05 LAB — PREGNANCY, URINE: Preg Test, Ur: NEGATIVE

## 2024-01-05 LAB — LIPASE, BLOOD: Lipase: 34 U/L (ref 11–51)

## 2024-01-05 LAB — TROPONIN T, HIGH SENSITIVITY
Troponin T High Sensitivity: 17 ng/L (ref <19)
Troponin T High Sensitivity: <15 ng/L (ref <19)

## 2024-01-05 LAB — LACTIC ACID, PLASMA
Lactic Acid, Venous: 3.9 mmol/L (ref 0.5–1.9)
Lactic Acid, Venous: 1.7 mmol/L (ref 0.5–1.9)

## 2024-01-05 MED ORDER — ALUM & MAG HYDROXIDE-SIMETH 200-200-20 MG/5ML PO SUSP
30.0000 mL | Freq: Once | ORAL | Status: AC
  Administered 2024-01-05: 30 mL via ORAL
  Filled 2024-01-05: qty 30

## 2024-01-05 MED ORDER — FENTANYL CITRATE PF 50 MCG/ML IJ SOSY
50.0000 ug | PREFILLED_SYRINGE | Freq: Once | INTRAMUSCULAR | Status: AC
  Administered 2024-01-05: 50 ug via INTRAVENOUS
  Filled 2024-01-05: qty 1

## 2024-01-05 MED ORDER — KETOROLAC TROMETHAMINE 15 MG/ML IJ SOLN
15.0000 mg | Freq: Once | INTRAMUSCULAR | Status: AC
  Administered 2024-01-05: 15 mg via INTRAVENOUS
  Filled 2024-01-05: qty 1

## 2024-01-05 MED ORDER — LACTATED RINGERS IV BOLUS
1000.0000 mL | Freq: Once | INTRAVENOUS | Status: AC
  Administered 2024-01-05: 1000 mL via INTRAVENOUS

## 2024-01-05 MED ORDER — PANTOPRAZOLE SODIUM 40 MG IV SOLR
40.0000 mg | Freq: Once | INTRAVENOUS | Status: AC
  Administered 2024-01-05: 40 mg via INTRAVENOUS
  Filled 2024-01-05: qty 10

## 2024-01-05 MED ORDER — LIDOCAINE VISCOUS HCL 2 % MT SOLN
15.0000 mL | Freq: Once | OROMUCOSAL | Status: AC
  Administered 2024-01-05: 15 mL via ORAL
  Filled 2024-01-05: qty 15

## 2024-01-05 MED ORDER — IOHEXOL 300 MG/ML  SOLN
100.0000 mL | Freq: Once | INTRAMUSCULAR | Status: AC | PRN
  Administered 2024-01-05: 100 mL via INTRAVENOUS

## 2024-01-05 MED ORDER — METRONIDAZOLE 500 MG/100ML IV SOLN
500.0000 mg | Freq: Once | INTRAVENOUS | Status: AC
  Administered 2024-01-05: 500 mg via INTRAVENOUS
  Filled 2024-01-05: qty 100

## 2024-01-05 MED ORDER — LACTATED RINGERS IV SOLN: INTRAVENOUS | Status: DC

## 2024-01-05 MED ORDER — ONDANSETRON HCL 4 MG/2ML IJ SOLN
4.0000 mg | Freq: Once | INTRAMUSCULAR | Status: AC
  Administered 2024-01-05: 4 mg via INTRAVENOUS
  Filled 2024-01-05: qty 2

## 2024-01-05 MED ORDER — SODIUM CHLORIDE 0.9 % IV SOLN
2.0000 g | Freq: Once | INTRAVENOUS | Status: AC
  Administered 2024-01-05: 2 g via INTRAVENOUS
  Filled 2024-01-05: qty 20

## 2024-01-05 MED ORDER — PANTOPRAZOLE SODIUM 20 MG PO TBEC: 20.0000 mg | DELAYED_RELEASE_TABLET | Freq: Every day | ORAL | 0 refills | Status: AC

## 2024-01-05 NOTE — ED Provider Notes (Signed)
  Physical Exam  BP (!) 130/92 (BP Location: Left Arm)   Pulse (!) 104   Temp 97.7 F (36.5 C) (Oral)   Resp 15   Ht 5' (1.524 m)   Wt 86.2 kg   LMP 06/04/2012   SpO2 96%   BMI 37.11 kg/m   Physical Exam  Procedures  Procedures  ED Course / MDM   Clinical Course as of 01/05/24 1911  Mon Jan 05, 2024  1714 Given lactic acid at 3.9, tachycardia, leukocytosis will start on sepsis order set.  Unknown source of infection at this time. Likely intra-abdominal [AF]    Clinical Course User Index [AF] Veta Palma, PA-C   Medical Decision Making Amount and/or Complexity of Data Reviewed Labs: ordered. Radiology: ordered.  Risk Prescription drug management.   Epigastric x 3 days No N, V, CP, SOB Small volume diarrhea, nonbloody Meets sepsis criteria WBC 17,  LA 3.9 Unclear source right now Pending CT abd/pel Recently treated for strep  T2DM, HTN,  Admitted last year for sinusitis  CT:  IMPRESSION: Dependent atelectasis in the lower lobes.   No acute cardiopulmonary disease. No acute findings in the abdomen or pelvis.   Hepatic steatosis  On recheck, the patient feels better. Her vital signs have improved. No more tachycardia, remains afebrile, BP 130/92. She is well appearing. Repeat lactic acid is normal at 1.7. CT chest/abd/pel unremarkable for infection, obstruction, mass or blockage.   She is seen and evaluated by Dr. Mannie who advises to started medication for GERD and discharge to home. Patient in agreement.      Odell Balls, PA-C 01/05/24 2128    Mannie Pac T, DO 01/07/24 [phone]

## 2024-01-05 NOTE — Discharge Instructions (Addendum)
 As was discussed, you can be discharged home and should follow up with your doctor in 3-4 days for recheck. Take Protonix  as prescribed, once daily in the mornings.   Return to the ED with any new or concerning symptoms. Otherwise, please call your doctor in the morning to schedule an appointment for later in the week for recheck.

## 2024-01-05 NOTE — ED Triage Notes (Signed)
 Pt c/o epigastric pain that radiates to mid abdomen, nausea, diarrhea and diaphresisx3d.

## 2024-01-05 NOTE — Sepsis Progress Note (Signed)
 Code sepsis protocol being monitored by eLink.

## 2024-01-05 NOTE — ED Notes (Signed)
 Patient is being discharged from the Urgent Care and sent to the Emergency Department via POV . Per Myla Bold, NP, patient is in need of higher level of care due to needing higher level of care. Patient is aware and verbalizes understanding of plan of care.  Vitals:   01/05/24 1504  BP: (!) 122/96  Pulse: (!) 128  Resp: 18  Temp: 98.1 F (36.7 C)  SpO2: 98%

## 2024-01-05 NOTE — ED Provider Notes (Signed)
 [place] [facility] AT [facility] [place] Provider Note   CSN: [id number] Arrival date & time: 01/05/24  1538     Patient presents with: No chief complaint on file.   Lauren Soto is a 53 y.o. female with history of type 2 diabetes, hypertension, previous sepsis due to sinusitis, presents with concern for mid abdominal pain for the past 3 days.  States the pain is constant.  This is not associated with any nausea, vomiting, or chills.  Does report a dry cough for the past couple weeks.  She does note feeling very hot earlier today at work which prompted her to seek evaluation earlier at urgent care.  She also reports some nonbloody diarrhea at home.  Previous abdominal surgeries include previous umbilical hernia repair, hysterectomy, and cholecystectomy   HPI     Prior to Admission medications   Medication Sig Start Date End Date Taking? Authorizing Provider  Atogepant  (QULIPTA ) 60 MG TABS Take 1 tablet (60 mg total) by mouth daily. 11/21/23   Skeet Juliene SAUNDERS, DO  atorvastatin  (LIPITOR) 20 MG tablet Take 1 tablet (20 mg total) by mouth daily. 06/04/23   [name], NP  Blood Glucose Monitoring Suppl DEVI 1 each by Does not apply route in the morning, at noon, and at bedtime. May substitute to any manufacturer covered by patient's insurance. 11/27/23   [name], NP  cyclobenzaprine  (FLEXERIL ) 10 MG tablet Take 1 tablet by mouth three times daily as needed for muscle spasm 11/25/23   Georgina Ozell LABOR, MD  diclofenac Sodium (VOLTAREN) 1 % GEL Apply 2 g topically 4 (four) times daily as needed (pain).    [provider]  dicyclomine  (BENTYL ) 20 MG tablet Take 0.5 tablets (10 mg total) by mouth 2 (two) times daily as needed for spasms. 08/04/23   [name], NP  famotidine  (PEPCID ) 20 MG tablet Take 1 tablet (20 mg total) by mouth 2 (two) times daily. 12/29/23   [name], NP  hydrochlorothiazide  (HYDRODIURIL ) 12.5 MG tablet Take 1 tablet (12.5 mg  total) by mouth daily. 06/04/23   [name], NP  Insulin  Pen Needle (NOVOFINE PEN NEEDLE) 32G X 6 MM MISC 1 Device by Does not apply route daily. To use with Victoza  07/23/23   [name], NP  liraglutide  (VICTOZA ) 18 MG/3ML SOPN Start 0.6mg  SQ once a day for 7 days, then increase to 1.2mg  once a day Patient not taking: Reported on 12/24/2023 06/16/23   Almarie Waddell B, NP  lisinopril  (ZESTRIL ) 40 MG tablet Take 1 tablet (40 mg total) by mouth daily. 06/04/23   [name], NP  meclizine  (ANTIVERT ) 25 MG tablet Take 1 tablet (25 mg total) by mouth 3 (three) times daily as needed for dizziness. 11/21/23   Skeet Juliene SAUNDERS, DO  meloxicam  (MOBIC ) 15 MG tablet Take 1 tablet (15 mg total) by mouth daily. 06/04/23   [name], NP  metFORMIN  (GLUCOPHAGE ) 850 MG tablet Take 1 tablet (850 mg total) by mouth 2 (two) times daily. Patient taking differently: Take 850 mg by mouth daily with breakfast. 06/04/23   [name], NP  metoprolol  succinate (TOPROL -XL) 50 MG 24 hr tablet Take 1 tablet (50 mg total) by mouth daily. Take with or immediately following a meal. 06/04/23 12/24/23  [name], NP  nitroGLYCERIN  (NITROSTAT ) 0.4 MG SL tablet Place 1 tablet (0.4 mg total) under the tongue every 5 (five) minutes as needed for chest pain. 02/27/23  [name], NP  ondansetron  (ZOFRAN -ODT) 4 MG disintegrating tablet Take 1 tablet (4 mg total) by mouth every 8 (eight) hours as needed. 11/21/23   Skeet Juliene SAUNDERS, DO  predniSONE  (DELTASONE ) 20 MG tablet Take 1 tablet (20 mg total) by mouth daily with breakfast. Patient not taking: Reported on 12/24/2023 11/27/23   [name], NP  pregabalin  (LYRICA ) 100 MG capsule Take 1 capsule (100 mg total) by mouth 2 (two) times daily. 09/10/23   Emeline Joesph BROCKS, DO  SUMAtriptan  (IMITREX ) 100 MG tablet Take 1 tablet (100 mg total) by mouth as needed for migraine. May repeat in 2 hours if headache persists or recurs.  Maximum 2 tablets in 24 hours. 11/21/23   Skeet Juliene SAUNDERS, DO  urea  (CARMOL) 10 % cream Apply topically as needed. 04/04/23   Standiford, Marsa FALCON, DPM    Allergies: Patient has no known allergies.    Review of Systems  Constitutional:  Negative for fever.  Gastrointestinal:  Positive for abdominal pain.    Updated Vital Signs BP (!) 130/92 (BP Location: Left Arm)   Pulse (!) 104   Temp 97.7 F (36.5 C) (Oral)   Resp 15   Ht 5' (1.524 m)   Wt 86.2 kg   LMP 06/04/2012   SpO2 96%   BMI 37.11 kg/m   Physical Exam Vitals and nursing note reviewed.  Constitutional:      General: She is not in acute distress.    Appearance: She is well-developed.     Comments: Appears uncomfortable due to pain  HENT:     Head: Normocephalic and atraumatic.  Eyes:     Conjunctiva/sclera: Conjunctivae normal.  Cardiovascular:     Rate and Rhythm: Regular rhythm. Tachycardia present.     Heart sounds: No murmur heard. Pulmonary:     Effort: Pulmonary effort is normal. No respiratory distress.     Breath sounds: Normal breath sounds.  Abdominal:     Palpations: Abdomen is soft.     Tenderness: There is abdominal tenderness.     Comments: Epigastric tenderness to palpation with mild guarding.  No rebound  Musculoskeletal:        General: No swelling.     Cervical back: Neck supple.  Skin:    General: Skin is warm and dry.     Capillary Refill: Capillary refill takes less than 2 seconds.  Neurological:     Mental Status: She is alert.  Psychiatric:        Mood and Affect: Mood normal.     (all labs ordered are listed, but only abnormal results are displayed) Labs Reviewed  COMPREHENSIVE METABOLIC PANEL WITH GFR - Abnormal; Notable for the following components:      Result Value   Chloride 97 (*)    Glucose, Bld 131 (*)    Alkaline Phosphatase 184 (*)    Anion gap 18 (*)    All other components within normal limits  CBC - Abnormal; Notable for the following components:   WBC 17.5 (*)    Platelets 434 (*)    All other components  within normal limits  LACTIC ACID, PLASMA - Abnormal; Notable for the following components:   Lactic Acid, Venous 3.9 (*)    All other components within normal limits  CULTURE, BLOOD (ROUTINE X 2)  CULTURE, BLOOD (ROUTINE X 2)  LIPASE, BLOOD  URINALYSIS, ROUTINE W REFLEX MICROSCOPIC  PREGNANCY, URINE  LACTIC ACID, PLASMA  TROPONIN T, HIGH SENSITIVITY  TROPONIN T,  HIGH SENSITIVITY    EKG: None  Radiology: No results found.   .Critical Care  Performed by: Veta Palma, PA-C Authorized by: Veta Palma, PA-C   Critical care provider statement:    Critical care time (minutes):  34   Critical care was necessary to treat or prevent imminent or life-threatening deterioration of the following conditions:  Sepsis   Critical care was time spent personally by me on the following activities:  Development of treatment plan with patient or surrogate, evaluation of patient's response to treatment, examination of patient, ordering and review of laboratory studies, ordering and review of radiographic studies, ordering and performing treatments and interventions, pulse oximetry, re-evaluation of patient's condition, review of old charts and obtaining history from patient or surrogate    Medications Ordered in the ED  lactated ringers  infusion ( Intravenous New Bag/Given 01/05/24 1838)  fentaNYL  (SUBLIMAZE ) injection 50 mcg (50 mcg Intravenous Given 01/05/24 1646)  lactated ringers  bolus 1,000 mL (0 mLs Intravenous Stopped 01/05/24 1754)  ondansetron  (ZOFRAN ) injection 4 mg (4 mg Intravenous Given 01/05/24 1645)  cefTRIAXone  (ROCEPHIN ) 2 g in sodium chloride  0.9 % 100 mL IVPB (0 g Intravenous Stopped 01/05/24 1818)  metroNIDAZOLE  (FLAGYL ) IVPB 500 mg (500 mg Intravenous New Bag/Given 01/05/24 1755)  ketorolac  (TORADOL ) 15 MG/ML injection 15 mg (15 mg Intravenous Given 01/05/24 1737)  fentaNYL  (SUBLIMAZE ) injection 50 mcg (50 mcg Intravenous Given 01/05/24 1738)  iohexol  (OMNIPAQUE ) 300 MG/ML solution  100 mL (100 mLs Intravenous Contrast Given 01/05/24 1814)    Clinical Course as of 01/05/24 1908  Mon Jan 05, 2024  1714 Given lactic acid at 3.9, tachycardia, leukocytosis will start on sepsis order set.  Unknown source of infection at this time. Likely intra-abdominal [AF]    Clinical Course User Index [AF] Veta Palma, PA-C                                 Medical Decision Making Amount and/or Complexity of Data Reviewed Labs: ordered. Radiology: ordered.  Risk Prescription drug management.     Differential diagnosis includes but is not limited to ACS, pneumonia,  peptic ulcer, gastritis, gastroenteritis, appendicitis, IBS, IBD, DKA, nephrolithiasis, UTI, pyelonephritis, pancreatitis, diverticulitis, mesenteric ischemia, abdominal aortic aneurysm, small bowel obstruction, volvulus   ED Course:  Upon initial evaluation, patient does appear slightly uncomfortable from pain, but no acute distress.  She was initially tachycardic into the 120s upon arrival.  Afebrile.  Mildly elevated blood pressure 132/103.  Tender to palpation in the epigastric region.  No active vomiting.  Labs Ordered: I Ordered, and personally interpreted labs.  The pertinent results include:   CBC with leukocytosis of 17.5 CMP with elevated alk phos at 184.  No other LFT elevations.  Normal creatinine.  Mildly elevated glucose at 131.  Anion gap 18 Lipase within normal limits Lactic acid elevated at 3.9 Urinalysis without signs of infection Pregnancy negative Blood cultures pending Initial troponin 17, repeat under 15  Imaging Studies ordered: I ordered imaging studies including CT chest abdomen pelvis, awaiting imaging results   Cardiac Monitoring: / EKG: The patient was maintained on a cardiac monitor.  I personally viewed and interpreted the cardiac monitored which showed an underlying rhythm of: sinus tachycardia  Medications Given: Fentanyl , Toradol  LR bolus LR maintenance  fluids Ceftriaxone  Metronidazole   Upon re-evaluation, patient reports pain improved slightly with fentanyl , but still 8 out of 10.  Added on additional Toradol .  Starting sepsis order  set including ceftriaxone  and metronidazole  for suspected intra-abdominal source as soon as patient's labs with leukocytosis and lactic acid resulted which are concerning for sepsis in the setting of patient's tachycardia and abdominal pain.   6:51 PM.  Upon reevaluation, patient reports pain is still about the same.  Her tachycardia has improved into the upper 90s with fluid bolus.  This does not appear to be ACS at this time given EKG without ST elevations, initial and repeat troponin remain without elevation, and patient denying any chest pain or shortness of breath.  Still awaiting CT scan results to look for source of infection and source of pain.  Impression: Patient meeting sepsis criteria Abdominal pain  Disposition:  Care of this patient signed out to oncoming ED provider Margit Paris, PA-C to follow up on CT scan result. Patient will likely need admission. Disposition and treatment plan pending imaging results and clinical judgment of oncoming ED team.      Record Review: External records from outside source obtained and reviewed including Previous ER admission for sepsis     This chart was dictated using voice recognition software, Dragon. Despite the best efforts of this provider to proofread and correct errors, errors may still occur which can change documentation meaning.       Final diagnoses:  SIRS (systemic inflammatory response syndrome) (HCC)  Epigastric pain    ED Discharge Orders     None          Veta Palma, PA-C 01/05/24 1908    Mannie Pac T, DO 01/07/24 [phone]

## 2024-01-05 NOTE — ED Triage Notes (Signed)
 PT POV in wheelchair- sent by UC- c/o mid abd pain x3 days. Reports nausea, diarrhea.  Denies emesis, known sick contacts, fever.   Recently dx with strep throat.

## 2024-01-05 NOTE — ED Provider Notes (Signed)
 [facility]    CSN: [id number] Arrival date & time: 01/05/24  1457      History   Chief Complaint No chief complaint on file.   HPI Lauren Soto is a 53 y.o. female with a past medical history of asthma, hypertension, hyperlipidemia, diabetes, GERD presents for abdominal pain.  Patient reports a persistent worsening epigastric abdominal pain that radiates down into her lower abdomen.  It is associated with nausea but no vomiting.  Reports 2 episodes of nonbloody diarrhea today.  Also reports this morning she broke out in a cold sweat while at work.  Denies any chest pain, shortness of breath, syncope, dizziness.  States this does not feel like her typical GERD symptoms.  She does take famotidine  daily.  She states she has barely been able to eat due to the pain.  Has a history of cholecystectomy and hysterectomy.  Cannot identify any aggravating or alleviating factors for her symptoms.  No OTC treatments have been used.  No other concerns at this time.  HPI  Past Medical History:  Diagnosis Date   Acute cystitis 02/03/2023   AKI (acute kidney injury) (HCC) 02/02/2023   Asthma 04/21/2014   Chronic right hip pain 11/06/2022   Diabetes mellitus without complication (HCC)    Essential hypertension 05/25/2012   GERD (gastroesophageal reflux disease) 02/03/2023   Hyperlipidemia 11/06/2022   Hypertension    Long term current use of oral hypoglycemic drug 11/06/2022   Menorrhagia 06/08/2012   Non-insulin  dependent type 2 diabetes mellitus (HCC) 06/18/2018   Pelvic pain in female 06/08/2012   Postural dizziness with presyncope 02/03/2023   Severe sepsis (HCC) 02/03/2023   Uterine fibroid 05/25/2012   Vertigo    Vestibular migraine 07/06/2018    Patient Active Problem List   Diagnosis Date Noted   Polyarthralgia 11/27/2023   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   Fibromyalgia 09/10/2023   Other insomnia 09/10/2023   Obesity (BMI 35.0-39.9 without comorbidity)  03/19/2023   Vertigo    Severe sepsis (HCC) 02/03/2023   Postural dizziness with presyncope 02/03/2023   GERD (gastroesophageal reflux disease) 02/03/2023   Acute cystitis 02/03/2023   AKI (acute kidney injury) (HCC) 02/02/2023   Long term current use of oral hypoglycemic drug 11/06/2022   Chronic right hip pain 11/06/2022   Hyperlipidemia 11/06/2022   Vestibular migraine 07/06/2018   Non-insulin  dependent type 2 diabetes mellitus (HCC) 06/18/2018   Asthma 04/21/2014   Menorrhagia 06/08/2012   Pelvic pain in female 06/08/2012   Essential hypertension 05/25/2012   Uterine fibroid 05/25/2012    Past Surgical History:  Procedure Laterality Date   ABDOMINAL HYSTERECTOMY     BUNIONECTOMY     CHOLECYSTECTOMY     HERNIA REPAIR     umblical   SHOULDER ARTHROSCOPY      OB History     Gravida  2   Para  2   Term  2   Preterm  0   AB  0   Living  2      SAB  0   IAB  0   Ectopic  0   Multiple  0   Live Births               Home Medications    Prior to Admission medications   Medication Sig Start Date End Date Taking? Authorizing Provider  Atogepant  (QULIPTA ) 60 MG TABS Take 1 tablet (60 mg total) by mouth daily. 11/21/23   Skeet Juliene SAUNDERS, DO  atorvastatin  (LIPITOR) 20 MG tablet Take 1 tablet (20 mg total) by mouth daily. 06/04/23   [name], NP  Blood Glucose Monitoring Suppl DEVI 1 each by Does not apply route in the morning, at noon, and at bedtime. May substitute to any manufacturer covered by patient's insurance. 11/27/23   [name], NP  cyclobenzaprine  (FLEXERIL ) 10 MG tablet Take 1 tablet by mouth three times daily as needed for muscle spasm 11/25/23   Georgina Ozell LABOR, MD  diclofenac Sodium (VOLTAREN) 1 % GEL Apply 2 g topically 4 (four) times daily as needed (pain).    [provider]  dicyclomine  (BENTYL ) 20 MG tablet Take 0.5 tablets (10 mg total) by mouth 2 (two) times daily as needed for spasms. 08/04/23   [name], NP   famotidine  (PEPCID ) 20 MG tablet Take 1 tablet (20 mg total) by mouth 2 (two) times daily. 12/29/23   [name], NP  hydrochlorothiazide  (HYDRODIURIL ) 12.5 MG tablet Take 1 tablet (12.5 mg total) by mouth daily. 06/04/23   [name], NP  Insulin  Pen Needle (NOVOFINE PEN NEEDLE) 32G X 6 MM MISC 1 Device by Does not apply route daily. To use with Victoza  07/23/23   [name], NP  liraglutide  (VICTOZA ) 18 MG/3ML SOPN Start 0.6mg  SQ once a day for 7 days, then increase to 1.2mg  once a day Patient not taking: Reported on 12/24/2023 06/16/23   Almarie Waddell B, NP  lisinopril  (ZESTRIL ) 40 MG tablet Take 1 tablet (40 mg total) by mouth daily. 06/04/23   [name], NP  meclizine  (ANTIVERT ) 25 MG tablet Take 1 tablet (25 mg total) by mouth 3 (three) times daily as needed for dizziness. 11/21/23   Skeet Juliene SAUNDERS, DO  meloxicam  (MOBIC ) 15 MG tablet Take 1 tablet (15 mg total) by mouth daily. 06/04/23   [name], NP  metFORMIN  (GLUCOPHAGE ) 850 MG tablet Take 1 tablet (850 mg total) by mouth 2 (two) times daily. Patient taking differently: Take 850 mg by mouth daily with breakfast. 06/04/23   [name], NP  metoprolol  succinate (TOPROL -XL) 50 MG 24 hr tablet Take 1 tablet (50 mg total) by mouth daily. Take with or immediately following a meal. 06/04/23 12/24/23  [name], NP  nitroGLYCERIN  (NITROSTAT ) 0.4 MG SL tablet Place 1 tablet (0.4 mg total) under the tongue every 5 (five) minutes as needed for chest pain. 02/27/23   [name], NP  ondansetron  (ZOFRAN -ODT) 4 MG disintegrating tablet Take 1 tablet (4 mg total) by mouth every 8 (eight) hours as needed. 11/21/23   Skeet Juliene SAUNDERS, DO  predniSONE  (DELTASONE ) 20 MG tablet Take 1 tablet (20 mg total) by mouth daily with breakfast. Patient not taking: Reported on 12/24/2023 11/27/23   [name], NP  pregabalin  (LYRICA ) 100 MG capsule Take 1 capsule (100 mg total) by mouth 2 (two) times daily. 09/10/23   Emeline Joesph BROCKS, DO   SUMAtriptan  (IMITREX ) 100 MG tablet Take 1 tablet (100 mg total) by mouth as needed for migraine. May repeat in 2 hours if headache persists or recurs.  Maximum 2 tablets in 24 hours. 11/21/23   Skeet Juliene SAUNDERS, DO  urea  (CARMOL) 10 % cream Apply topically as needed. 04/04/23   Standiford, Marsa FALCON, DPM    Family History Family History  Problem Relation Age of Onset   Neuropathy Mother    Dementia Mother    Hypertension Mother    Multiple myeloma Mother  Arthritis Mother    Dementia Father    Hypertension Father     Social History Social History   Tobacco Use   Smoking status: Former    Current packs/day: 0.00    Types: Cigarettes    Quit date: 2009    Years since quitting: 16.5   Smokeless tobacco: Never   Tobacco comments:    Every once in awhile will vape  Vaping Use   Vaping status: Former   Quit date: 07/04/2022  Substance Use Topics   Alcohol use: No   Drug use: No     Allergies   Patient has no known allergies.   Review of Systems Review of Systems  Gastrointestinal:  Positive for abdominal pain, diarrhea and nausea.     Physical Exam Triage Vital Signs ED Triage Vitals  Encounter Vitals Group     BP 01/05/24 1504 (!) 122/96     Girls Systolic BP Percentile --      Girls Diastolic BP Percentile --      Boys Systolic BP Percentile --      Boys Diastolic BP Percentile --      Pulse Rate 01/05/24 1504 (!) 128     Resp 01/05/24 1504 18     Temp 01/05/24 1504 98.1 F (36.7 C)     Temp Source 01/05/24 1504 Oral     SpO2 01/05/24 1504 98 %     Weight --      Height --      Head Circumference --      Peak Flow --      Pain Score 01/05/24 1501 10     Pain Loc --      Pain Education --      Exclude from Growth Chart --    No data found.  Updated Vital Signs BP (!) 122/96   Pulse (!) 128   Temp 98.1 F (36.7 C) (Oral)   Resp 18   LMP 06/04/2012   SpO2 98%   Visual Acuity Right Eye Distance:   Left Eye Distance:   Bilateral Distance:     Right Eye Near:   Left Eye Near:    Bilateral Near:     Physical Exam Vitals and nursing note reviewed.  Constitutional:      Appearance: Normal appearance. She is obese. She is not diaphoretic.     Comments: Appears uncomfortable laying on her side in the exam room  HENT:     Head: Normocephalic and atraumatic.  Eyes:     Pupils: Pupils are equal, round, and reactive to light.  Cardiovascular:     Rate and Rhythm: Tachycardia present.  Pulmonary:     Effort: Pulmonary effort is normal.  Abdominal:     General: Bowel sounds are increased.     Palpations: Abdomen is soft.     Tenderness: There is generalized abdominal tenderness. There is guarding.     Comments: Moderate to severe abdominal pain with palpation to the right upper, left upper, and lower left quadrants  Skin:    General: Skin is warm and dry.  Neurological:     General: No focal deficit present.     Mental Status: She is alert and oriented to person, place, and time.  Psychiatric:        Mood and Affect: Mood normal.        Behavior: Behavior normal.      UC Treatments / Results  Labs (all labs ordered are listed, but only abnormal  results are displayed) Labs Reviewed - No data to display  EKG Sinus tach heart rate 118 with no acute ST-T wave changes.  Radiology No results found.  Procedures Procedures (including critical care time)  Medications Ordered in UC Medications - No data to display  Initial Impression / Assessment and Plan / UC Course  I have reviewed the triage vital signs and the nursing notes.  Pertinent labs & imaging results that were available during my care of the patient were reviewed by me and considered in my medical decision making (see chart for details).     I reviewed exam and symptoms with patient.  Discussed limitations and abilities of urgent care.  Patient presenting with worsening epigastric/abdominal pain x 3 days.  EKG shows sinus tach without any acute ST-T  wave changes.  Given her symptoms and exam I advise she go to the emergency room for further evaluation/imaging.  She declines EMS transfer and states she will go with her partner driving.  She was instructed to pull over and call 911 for any worsening symptoms that occur in transit and she verbalized understanding. Final Clinical Impressions(s) / UC Diagnoses   Final diagnoses:  Epigastric pain     Discharge Instructions      Please go to the ER for further evaluation of your symptoms    ED Prescriptions   None    PDMP not reviewed this encounter.   Loreda Myla SAUNDERS, NP 01/05/24 [phone]

## 2024-01-05 NOTE — Discharge Instructions (Signed)
 Please go to the ER for further evaluation of your symptoms

## 2024-01-06 ENCOUNTER — Encounter: Payer: Self-pay | Admitting: Family Medicine

## 2024-01-06 ENCOUNTER — Ambulatory Visit (INDEPENDENT_AMBULATORY_CARE_PROVIDER_SITE_OTHER): Admitting: Family Medicine

## 2024-01-06 VITALS — BP 117/75 | HR 108 | Ht 60.0 in | Wt 190.0 lb

## 2024-01-06 DIAGNOSIS — Z09 Encounter for follow-up examination after completed treatment for conditions other than malignant neoplasm: Secondary | ICD-10-CM

## 2024-01-06 DIAGNOSIS — R1013 Epigastric pain: Secondary | ICD-10-CM

## 2024-01-06 LAB — CBC WITH DIFFERENTIAL/PLATELET
Basophils Absolute: 0.1 K/uL (ref 0.0–0.1)
Basophils Relative: 0.6 % (ref 0.0–3.0)
Eosinophils Absolute: 0.1 K/uL (ref 0.0–0.7)
Eosinophils Relative: 1.5 % (ref 0.0–5.0)
HCT: 36.5 % (ref 36.0–46.0)
Hemoglobin: 11.9 g/dL — ABNORMAL LOW (ref 12.0–15.0)
Lymphocytes Relative: 30.3 % (ref 12.0–46.0)
Lymphs Abs: 2.4 K/uL (ref 0.7–4.0)
MCHC: 32.5 g/dL (ref 30.0–36.0)
MCV: 84.2 fl (ref 78.0–100.0)
Monocytes Absolute: 0.6 K/uL (ref 0.1–1.0)
Monocytes Relative: 7.4 % (ref 3.0–12.0)
Neutro Abs: 4.8 K/uL (ref 1.4–7.7)
Neutrophils Relative %: 60.2 % (ref 43.0–77.0)
Platelets: 348 K/uL (ref 150.0–400.0)
RBC: 4.33 Mil/uL (ref 3.87–5.11)
RDW: 14.5 % (ref 11.5–15.5)
WBC: 7.9 K/uL (ref 4.0–10.5)

## 2024-01-06 NOTE — Progress Notes (Signed)
 Acute Office Visit  Subjective:     Patient ID: Lauren Soto, female    DOB: Dec 18, 1970, 53 y.o.   MRN: [id number]  Chief Complaint  Patient presents with   Medical Management of Chronic Issues   Abdominal Pain    HPI Patient is in today for epigastric pain, ED follow-up.   Discussed the use of AI scribe software for clinical note transcription with the patient, who gave verbal consent to proceed.  History of Present Illness Lauren Soto is a 53 year old female who presents with abdominal pain following recent antibiotic use for strep throat.  She began experiencing severe stomach pain on Friday night after completing a course of antibiotics for strep throat, leading to an emergency room visit where she received pain medication, fluids, and antibiotics. Her white blood cell count and lactic acid levels were elevated but improved with treatment. Abd/pelvis CT scan showed no acute findings but revealed fatty liver.  The pain is described as a pressure-like sensation in the upper abdomen with tenderness lower down, likened to 'somebody's putting her fist into you'. No heartburn, but she experienced burping without pain or burning. She felt nauseous but did not vomit. Bowel movements have been infrequent, with a loose stool noted yesterday.   She has a recent history of strep throat, for which she completed antibiotics, and reports that her throat symptoms have resolved.           ROS All review of systems negative except what is listed in the HPI      Objective:    BP 117/75   Pulse (!) 108   Ht 5' (1.524 m)   Wt 190 lb (86.2 kg)   LMP 06/04/2012   SpO2 100%   BMI 37.11 kg/m    Physical Exam Vitals reviewed.  Constitutional:      General: She is not in acute distress.    Appearance: She is well-developed.  Cardiovascular:     Rate and Rhythm: Normal rate and regular rhythm.  Abdominal:     General: Bowel sounds are normal.      Palpations: Abdomen is soft.     Tenderness: There is generalized abdominal tenderness and tenderness in the epigastric area.  Neurological:     Mental Status: She is alert and oriented to person, place, and time.  Psychiatric:        Mood and Affect: Mood normal.        Behavior: Behavior normal.       No results found for any visits on 01/06/24.      Assessment & Plan:   Problem List Items Addressed This Visit   None Visit Diagnoses       Hospital discharge follow-up    -  Primary   Relevant Orders   CBC with Differential/Platelet     Epigastric pain       Relevant Orders   CBC with Differential/Platelet       Assessment & Plan Epigastric Pain Epigastric pain post-antibiotics, consider viral illness, stomach ulcer or H. pylori infection. C. difficile less likely but considered due to recent antibiotic use. Discussed risks of untreated symptoms and benefits of treatment. Alternatives include gastroenterology referral if symptoms persist or worsen. - Recheck blood counts to ensure WBC is trending down. - Start pantoprazole  30 minutes before breakfast daily for two weeks. - Take a daily probiotic to restore gut flora. - Maintain a gentle diet, avoiding dairy, spicy foods, caffeine, and citrus. - Monitor  for changes in stool frequency or appearance, report if diarrhea becomes more frequent or appears seedy - will check C diff if so. - Consider gastroenterology referral for endoscopy if symptoms worsen. - Consider H. pylori testing after a month off antibiotics and two weeks off acid blockers if symptoms persist.  Fatty Liver Incidental fatty liver on CT scan, no acute intervention needed. Lifestyle changes recommended to prevent progression. - Recommend low-carb diet, regular exercise, and weight loss.  Streptococcal Pharyngitis Completed antibiotics, symptoms resolved.      No orders of the defined types were placed in this encounter.   Return if symptoms  worsen or fail to improve.  Waddell KATHEE Mon, NP

## 2024-01-07 ENCOUNTER — Telehealth: Payer: Self-pay | Admitting: Family Medicine

## 2024-01-07 ENCOUNTER — Ambulatory Visit: Payer: Self-pay | Admitting: Family Medicine

## 2024-01-07 DIAGNOSIS — E119 Type 2 diabetes mellitus without complications: Secondary | ICD-10-CM

## 2024-01-07 MED ORDER — LIRAGLUTIDE 18 MG/3ML ~~LOC~~ SOPN: PEN_INJECTOR | SUBCUTANEOUS | 3 refills | Status: AC

## 2024-01-07 NOTE — Telephone Encounter (Signed)
 Spoke with pharmacy. They state she does need PA on Victoza . I sent information from previous note in May stating it was approved, but on their end they are still requiring a PA. Please advise and contact [facility] if needed.

## 2024-01-07 NOTE — Telephone Encounter (Signed)
 Copied from CRM [id number]. Topic: Clinical - Prescription Issue >> Jan 07, 2024  2:22 PM Gennette ORN wrote: Reason for CRM: Patient is having issues at pharmacy.

## 2024-01-08 ENCOUNTER — Other Ambulatory Visit (HOSPITAL_COMMUNITY): Payer: Self-pay

## 2024-01-08 ENCOUNTER — Telehealth: Payer: Self-pay

## 2024-01-08 NOTE — Telephone Encounter (Signed)
 Pharmacy Patient Advocate Encounter   Received notification from Pt Calls Messages that prior authorization for Liraglutide  18mg /46ml (Victoza ) is required/requested.   Insurance verification completed.   The patient is insured through [facility] .   Per test claim: PA required; PA submitted to above mentioned insurance via CoverMyMeds Key/confirmation #/EOC [id number] Status is pending

## 2024-01-08 NOTE — Telephone Encounter (Signed)
 Pharmacy Patient Advocate Encounter  Received notification from [facility] that Prior Authorization for Ligraglutide 18mg /43ml has been APPROVED from 01/08/24 to 01/08/27   PA #/Case ID/Reference #: [id number]  Left a message at the pharmacy to notify of the approval

## 2024-01-09 ENCOUNTER — Encounter: Attending: Physical Medicine & Rehabilitation | Admitting: Physical Medicine & Rehabilitation

## 2024-01-09 ENCOUNTER — Encounter: Payer: Self-pay | Admitting: Physical Medicine & Rehabilitation

## 2024-01-09 DIAGNOSIS — M533 Sacrococcygeal disorders, not elsewhere classified: Secondary | ICD-10-CM | POA: Diagnosis not present

## 2024-01-09 DIAGNOSIS — R300 Dysuria: Secondary | ICD-10-CM | POA: Diagnosis present

## 2024-01-09 DIAGNOSIS — G8929 Other chronic pain: Secondary | ICD-10-CM | POA: Insufficient documentation

## 2024-01-09 DIAGNOSIS — M797 Fibromyalgia: Secondary | ICD-10-CM | POA: Diagnosis present

## 2024-01-09 DIAGNOSIS — M25551 Pain in right hip: Secondary | ICD-10-CM | POA: Insufficient documentation

## 2024-01-09 MED ORDER — IOHEXOL 180 MG/ML  SOLN
2.0000 mL | Freq: Once | INTRAMUSCULAR | Status: AC
  Administered 2024-01-09: 2 mL

## 2024-01-09 MED ORDER — LIDOCAINE HCL 1 % IJ SOLN
5.0000 mL | Freq: Once | INTRAMUSCULAR | Status: AC
  Administered 2024-01-09: 5 mL

## 2024-01-09 MED ORDER — BETAMETHASONE SOD PHOS & ACET 6 (3-3) MG/ML IJ SUSP
3.0000 mg | Freq: Once | INTRAMUSCULAR | Status: AC
  Administered 2024-01-09: 3 mg via INTRA_ARTICULAR

## 2024-01-09 MED ORDER — LIDOCAINE HCL (PF) 2 % IJ SOLN
1.0000 mL | Freq: Once | INTRAMUSCULAR | Status: AC
  Administered 2024-01-09: 1 mL

## 2024-01-09 NOTE — Progress Notes (Signed)
Right sacroiliac injection under fluoroscopic guidance  Indication: Right Low back and buttocks pain not relieved by medication management and other conservative care.  Informed consent was obtained after describing risks and benefits of the procedure with the patient, this includes bleeding, bruising, infection, paralysis and medication side effects. The patient wishes to proceed and has given written consent. The patient was placed in a prone position. The lumbar and sacral area was marked and prepped with Betadine. A 25-gauge 1-1/2 inch needle was inserted into the skin and subcutaneous tissue and 1 mL of 1% lidocaine was injected. Then a 25-gauge 3 inch spinal needle was inserted under fluoroscopic guidance into the Right sacroiliac joint. AP and lateral images were utilized. Omnipaque 180x0.5 mL under live fluoroscopy demonstrated no intravascular uptake. Then a solution containing one ML of 6 mgper ml betamethasone and 2 ML of 1% lidocaine MPF was injected x1.5 mL. Patient tolerated the procedure well. Post procedure instructions were given. Please see post procedure form. 

## 2024-01-09 NOTE — Progress Notes (Signed)
  PROCEDURE RECORD [facility]   Name: Lauren Soto DOB:Oct 31, 1970 MRN: [id number]  Date:01/09/2024  Physician: Prentice Compton, MD    Nurse/CMA: Roxanne Ruth  Allergies: No Known Allergies  Consent Signed: Yes.    Is patient diabetic? Yes.    CBG today? Don't check   Pregnant: No. LMP: Patient's last menstrual period was 06/04/2012. (age [age])  Anticoagulants: no Anti-inflammatory: yes (Meloxicam , LD 3 days ago) Antibiotics: no  Procedure: Right Sacroiliac Joint Injection  Position: Prone Start Time: 3:56 pm  End Time: 4:00 pm  Fluoro Time: 12  RN/CMA Ruth REYNOLDS Ruth, CMA    Time 3:34 pm 4:03 pm    BP 131 /91 141/96    Pulse 100 97    Respirations 16 16    O2 Sat 98 98    S/S 6 6    Pain Level 8/10 6/10     D/C home with family, patient A & O X 3, D/C instructions reviewed, and sits independently.         Subjective:    Patient ID: Lauren Soto, female    DOB: [birth year], 53 y.o.   MRN: [id number]  HPI    Review of Systems     Objective:   Physical Exam        Assessment & Plan:

## 2024-01-10 LAB — CULTURE, BLOOD (ROUTINE X 2)
Culture: NO GROWTH
Culture: NO GROWTH
Special Requests: ADEQUATE
Special Requests: ADEQUATE

## 2024-01-19 ENCOUNTER — Other Ambulatory Visit: Payer: Self-pay | Admitting: Physical Medicine and Rehabilitation

## 2024-01-19 ENCOUNTER — Encounter (HOSPITAL_BASED_OUTPATIENT_CLINIC_OR_DEPARTMENT_OTHER): Admitting: Physical Medicine and Rehabilitation

## 2024-01-19 VITALS — BP 120/87 | HR 88 | Ht 60.0 in | Wt 196.0 lb

## 2024-01-19 DIAGNOSIS — M533 Sacrococcygeal disorders, not elsewhere classified: Secondary | ICD-10-CM

## 2024-01-19 DIAGNOSIS — M797 Fibromyalgia: Secondary | ICD-10-CM | POA: Diagnosis not present

## 2024-01-19 DIAGNOSIS — M25551 Pain in right hip: Secondary | ICD-10-CM | POA: Diagnosis not present

## 2024-01-19 DIAGNOSIS — R300 Dysuria: Secondary | ICD-10-CM | POA: Diagnosis not present

## 2024-01-19 DIAGNOSIS — G8929 Other chronic pain: Secondary | ICD-10-CM

## 2024-01-19 MED ORDER — PREGABALIN 75 MG PO CAPS: 75.0000 mg | ORAL_CAPSULE | Freq: Two times a day (BID) | ORAL | 5 refills | Status: DC

## 2024-01-19 MED ORDER — DULOXETINE HCL 30 MG PO CPEP: ORAL_CAPSULE | ORAL | 3 refills | Status: DC

## 2024-01-19 NOTE — Progress Notes (Signed)
 Subjective:    Patient ID: Lauren Soto, female    DOB: 09/30/1970, 53 y.o.   MRN: [id number]  HPI   Lauren Soto is a 53 y.o. year old female  who  has a past medical history of Acute cystitis (02/03/2023), AKI (acute kidney injury) (HCC) (02/02/2023), Asthma (04/21/2014), Chronic right hip pain (11/06/2022), Diabetes mellitus without complication (HCC), Essential hypertension (05/25/2012), GERD (gastroesophageal reflux disease) (02/03/2023), Hyperlipidemia (11/06/2022), Hypertension, Long term current use of oral hypoglycemic drug (11/06/2022), Menorrhagia (06/08/2012), Non-insulin  dependent type 2 diabetes mellitus (HCC) (06/18/2018), Pelvic pain in female (06/08/2012), Postural dizziness with presyncope (02/03/2023), Severe sepsis (HCC) (02/03/2023), Uterine fibroid (05/25/2012), Vertigo, and Vestibular migraine (07/06/2018).   They are presenting to PM&R clinic for follow up related to  Fibromyalgia and R hip/low back pain .  Plan from last visit: Chronic right SI joint pain I am referring you to Dr. Carilyn for R SI joint injection   Follow up with me in 2 months   Sent work letter [employer] given patient's position in [employer] at [employer], recommending the following; allow to complete stationary tasks while seated if able or if standing allow a seated rest break for at least 20 minutes every 3-4 hours. .    Fibromyalgia Increase Lyrica  to 100 mg once a day. After 2-3 days, if no side effects, add your 75 mg capsules (old prescription) at nighttime. If no side effects after 2-3 days, resume old dosing to 100 mg twice daily.   Chronic right hip pain Use voltaren gel over the counter to the hip, low back, and shoulders up to 4 times daily for topical pain control.    I recommend trying to come off of meloxicam  if it is not helpful due to possible renal function and stomach side effects.   Failed R ESI and hip joint injections in the past    Other insomnia Pick the same time to lay down every night, ideally between 8 and 10 PM.     Starting 1 hour before you want to go to sleep, turn off all television screens, phone screens, tablets, and computers.     Keep the lights low and perform only low stimulation activities, such as reading.     Only use your bedroom for sleep and sex. Avoid daytime naps and limit any time spent in your bed that is not dedicated to sleep.    You may also take 3 to 5 mg of over-the-counter melatonin approximately 1 hour before bedtime, or if you are prescribed a medication for sleep take it at this time.   Avoid alcohol, decongestants/pseudoephedrine, antihistamines, caffeine, and nicotine a few hours before bedtime, as these can reduce your quality of sleep.      Interval Hx:  - Therapies: Nothing outside of work. Her work has been good about her accomodation - allowing her to sit and rest intermittently. She works on [employer] and there are no chairs or stools available to sit down during those shifts. She has to stand for about 20 minutes x4 and is running around in between.    - Follow ups: R SI joint injection with Dr. Carilyn 01/09/24 - I felt it; she was in a lot of pain from the injection and felt numb for awhile, it was not excruciating but I was in pain. She did not notice any change in her pain after the injection.    - Falls: none   - DME: none   -  Medications: She is unsure if she ended up changing her dose of Lyrica  -- looks like Dr Georgina picked up the script again after our last visit and refilled at the lower 75 mg BID dose.   No OTC for pain control.   Flexeril  she only takes at nighttime 10 mg; She says she is sleeping better now with this medication. She sleeps about 7 hours a night now.   Voltaren gel  - takes a few times per week, without effect.    - Other concerns:   Pain Inventory Average Pain 6 Pain Right Now 8 My pain is burning, dull, stabbing, and  aching  In the last 24 hours, has pain interfered with the following? General activity 9 Relation with others 9 Enjoyment of life 9 What TIME of day is your pain at its worst? daytime and night Sleep (in general) Fair  Pain is worse with: walking, bending, sitting, and standing Pain improves with: . Relief from Meds: 5  Family History  Problem Relation Age of Onset   Neuropathy Mother    Dementia Mother    Hypertension Mother    Multiple myeloma Mother    Arthritis Mother    Dementia Father    Hypertension Father    Social History   Socioeconomic History   Marital status: Married    Spouse name: Not on file   Number of children: Not on file   Years of education: Not on file   Highest education level: Not on file  Occupational History   Not on file  Tobacco Use   Smoking status: Former    Current packs/day: 0.00    Types: Cigarettes    Quit date: 2009    Years since quitting: 16.5   Smokeless tobacco: Never   Tobacco comments:    Every once in awhile will vape  Vaping Use   Vaping status: Some Days   Last attempt to quit: 07/04/2022  Substance and Sexual Activity   Alcohol use: No   Drug use: No   Sexual activity: Yes    Birth control/protection: Surgical  Other Topics Concern   Not on file  Social History Narrative   Are you right handed or left handed? Right   Are you currently employed ? NO   What is your current occupation?   Do you live at home alone?   Who lives with you?    What type of home do you live in: 1 story or 2 story? 1       Social Drivers of Corporate investment banker Strain: Not on file  Food Insecurity: No Food Insecurity (02/03/2023)   Hunger Vital Sign    Worried About Running Out of Food in the Last Year: Never true    Ran Out of Food in the Last Year: Never true  Transportation Needs: No Transportation Needs (02/03/2023)   PRAPARE - Administrator, Civil Service (Medical): No    Lack of Transportation (Non-Medical):  No  Physical Activity: Not on file  Stress: Not on file  Social Connections: Not on file   Past Surgical History:  Procedure Laterality Date   ABDOMINAL HYSTERECTOMY     BUNIONECTOMY     CHOLECYSTECTOMY     HERNIA REPAIR     umblical   SHOULDER ARTHROSCOPY     Past Surgical History:  Procedure Laterality Date   ABDOMINAL HYSTERECTOMY     BUNIONECTOMY     CHOLECYSTECTOMY     HERNIA  REPAIR     umblical   SHOULDER ARTHROSCOPY     Past Medical History:  Diagnosis Date   Acute cystitis 02/03/2023   AKI (acute kidney injury) (HCC) 02/02/2023   Asthma 04/21/2014   Chronic right hip pain 11/06/2022   Diabetes mellitus without complication (HCC)    Essential hypertension 05/25/2012   GERD (gastroesophageal reflux disease) 02/03/2023   Hyperlipidemia 11/06/2022   Hypertension    Long term current use of oral hypoglycemic drug 11/06/2022   Menorrhagia 06/08/2012   Non-insulin  dependent type 2 diabetes mellitus (HCC) 06/18/2018   Pelvic pain in female 06/08/2012   Postural dizziness with presyncope 02/03/2023   Severe sepsis (HCC) 02/03/2023   Uterine fibroid 05/25/2012   Vertigo    Vestibular migraine 07/06/2018   BP 120/87   Pulse 88   Ht 5' (1.524 m)   Wt 196 lb (88.9 kg)   LMP 06/04/2012   SpO2 100%   BMI 38.28 kg/m   Opioid Risk Score:   Fall Risk Score:  `1  Depression screen [facility] 2/9     09/10/2023    3:25 PM 03/28/2023    8:55 AM 11/06/2022    3:41 PM  Depression screen PHQ 2/9  Decreased Interest 1 2 1   Down, Depressed, Hopeless 1 1 1   PHQ - 2 Score 2 3 2   Altered sleeping 3 3 2   Tired, decreased energy 3 3 2   Change in appetite 1 1 2   Feeling bad or failure about yourself  0 0 0  Trouble concentrating 2 0 0  Moving slowly or fidgety/restless 1 2 0  Suicidal thoughts 0 0 0  PHQ-9 Score 12 12 8   Difficult doing work/chores  Somewhat difficult Somewhat difficult      Review of Systems  Musculoskeletal:  Positive for back pain and neck pain.  All  other systems reviewed and are negative.      Objective:   Physical Exam  PE: Constitution: Appropriate appearance for age. No apparent distress +Obese Resp: No respiratory distress. No accessory muscle usage. on RA Cardio: Well perfused appearance. No peripheral edema. Abdomen: Nondistended. Nontender.   Psych: Appropriate mood and affect. Neuro: AAOx4. No apparent cognitive deficits    Neurologic Exam:   DTRs: Reflexes were 2+ in bilateral achilles, patella, biceps, BR and triceps. Babinsky: flexor responses b/l.   Hoffmans: negative b/l Sensory exam: revealed normal sensation in all dermatomal regions in bilateral lower extremities and with reduced sensation to light touch in right low back and lateral hip Coordination: Fine motor coordination was normal.   Gait: normal       MSK: + TTP  b/l lumbar paraspinals, R PSIS, R SI joint   Hip Exam: R Inspection: No gross abnormalities on inspection of hip.  No muscle atrophy.   Palpation:  There was no pain with palpation at the: ASIS; AIIS; Hip flexors; Adductors; Greater Troch; ITB.   There was pain at R SI joint and R PSIS; bilateral lumbar paraspinals.    ROM:  Active: dynamic movement of the hip joint wnl. ROM revealed restricted ROM in internal rotation.    Strength: Laying Supine Rectus Femoris (straight leg)  4/5 [place]oas (bent knee)  5/5 Internal Rotation   5/5 External Rotation  5/5 Hip Extensors (extend hip with knee straight)    4/5   Special/Provocative tests:  FABERs-  + R low back pain with bilateral testing FAIR test -+ R lateral thigh pain Illiac compression test - - Yoemans (sacroillitis) - +  R lateral thigh pain Obers test - - Thomas test (for hip flexion contracture) - + R>L      Assessment & Plan:   Lauren Soto is a 53 y.o. year old female  who  has a past medical history of Acute cystitis (02/03/2023), AKI (acute kidney injury) (HCC) (02/02/2023), Asthma (04/21/2014), Chronic  right hip pain (11/06/2022), Diabetes mellitus without complication (HCC), Essential hypertension (05/25/2012), GERD (gastroesophageal reflux disease) (02/03/2023), Hyperlipidemia (11/06/2022), Hypertension, Long term current use of oral hypoglycemic drug (11/06/2022), Menorrhagia (06/08/2012), Non-insulin  dependent type 2 diabetes mellitus (HCC) (06/18/2018), Pelvic pain in female (06/08/2012), Postural dizziness with presyncope (02/03/2023), Severe sepsis (HCC) (02/03/2023), Uterine fibroid (05/25/2012), Vertigo, and Vestibular migraine (07/06/2018).   They are presenting to PM&R clinic for follow up related to  Fibromyalgia and R hip/low back pain .  Chronic right hip pain -     Ambulatory referral to Physical Therapy  Continue avoiding meloxicam  given recent gastrointestinal symptoms.  Should be okay to continue Voltaren gel.  Continue work restrictions recommending completing stationary test while seated if able or standing allow seated rest breaks for at least 20 minutes every 3-4 hours.  Follow-up in 3 months.  Chronic right SI joint pain -     Ambulatory referral to Physical Therapy  Will not recommend further injections, as these were not helpful.  I am sending the patient to physical therapy to work on strengthening, pain control, and standing tolerance.  Fibromyalgia -     Ambulatory referral to Physical Therapy  Continue Lyrica  75 mg twice daily; we can address increasing this again at future visits if needed  I am starting duloxetine  30 mg capsules.  Start with 1 capsule daily for 2 weeks; then if no side effects increase to 2 capsules daily.  I expect it to take 4 to 6 weeks before you see results from this medication.  Dysuria -     Urinalysis, Routine w reflex microscopic -     Urine Culture  Advised for abdominal pain, given distribution and description, may be secondary to gas.  Can try Maalox over-the-counter if this recurs.  Patient also endorses dysuria; sending her  for testing today and will message through MyChart regarding results.  ** UA negative, Ucx with some yeast    Other orders -     DULoxetine  HCl; Start with 30 mg daily for 2 weeks, then increase to 60 mg ( 2 capsules) daily.  Dispense: 60 capsule; Refill: 3 -     Pregabalin ; Take 1 capsule (75 mg total) by mouth 2 (two) times daily.  Dispense: 60 capsule; Refill: 5

## 2024-01-19 NOTE — Patient Instructions (Signed)
 Continue Lyrica  75 mg twice daily; we can address increasing this again at future visits if needed  I am starting duloxetine  30 mg capsules.  Start with 1 capsule daily for 2 weeks; then if no side effects increase to 2 capsules daily.  I expect it to take 4 to 6 weeks before you see results from this medication.  Continue avoiding meloxicam  given recent gastrointestinal symptoms.  Should be okay to continue Voltaren gel.  Advised for abdominal pain, given distribution and description, may be secondary to gas.  Can try Maalox over-the-counter if this recurs.  Patient also endorses dysuria; sending her for testing today and will message through MyChart regarding results.  Continue work restrictions recommending completing stationary test while seated if able or standing allow seated rest breaks for at least 20 minutes every 3-4 hours.  Will not recommend further injections, as these were not helpful.  I am sending the patient to physical therapy to work on strengthening, pain control, and standing tolerance.  Follow-up in 3 months.

## 2024-01-21 ENCOUNTER — Ambulatory Visit: Payer: Self-pay | Admitting: Physical Medicine and Rehabilitation

## 2024-01-22 LAB — URINE CULTURE

## 2024-01-26 NOTE — Telephone Encounter (Signed)
 I see urine culture from 7 days ago.      Component Ref Range & Units (hover) 7 d ago  Urine Culture, Routine Final report Abnormal   Organism ID, Bacteria Candida parapsilosis Abnormal

## 2024-01-28 ENCOUNTER — Other Ambulatory Visit (HOSPITAL_COMMUNITY): Payer: Self-pay

## 2024-02-02 NOTE — Therapy (Incomplete)
 OUTPATIENT PHYSICAL THERAPY CERVICAL AND THORACOLUMBAR EVALUATION   Patient Name: LOLITHA Soto MRN: [id number] DOB:12/23/1970, 53 y.o., female Today's Date: 02/02/2024   END OF SESSION:   Past Medical History:  Diagnosis Date   Acute cystitis 02/03/2023   AKI (acute kidney injury) (HCC) 02/02/2023   Asthma 04/21/2014   Chronic right hip pain 11/06/2022   Diabetes mellitus without complication (HCC)    Essential hypertension 05/25/2012   GERD (gastroesophageal reflux disease) 02/03/2023   Hyperlipidemia 11/06/2022   Hypertension    Long term current use of oral hypoglycemic drug 11/06/2022   Menorrhagia 06/08/2012   Non-insulin  dependent type 2 diabetes mellitus (HCC) 06/18/2018   Pelvic pain in female 06/08/2012   Postural dizziness with presyncope 02/03/2023   Severe sepsis (HCC) 02/03/2023   Uterine fibroid 05/25/2012   Vertigo    Vestibular migraine 07/06/2018   Past Surgical History:  Procedure Laterality Date   ABDOMINAL HYSTERECTOMY     BUNIONECTOMY     CHOLECYSTECTOMY     HERNIA REPAIR     umblical   SHOULDER ARTHROSCOPY     Patient Active Problem List   Diagnosis Date Noted   Dysuria 01/19/2024   Polyarthralgia 11/27/2023   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   Fibromyalgia 09/10/2023   Other insomnia 09/10/2023   Obesity (BMI 35.0-39.9 without comorbidity) 03/19/2023   Vertigo    Severe sepsis (HCC) 02/03/2023   Postural dizziness with presyncope 02/03/2023   GERD (gastroesophageal reflux disease) 02/03/2023   Acute cystitis 02/03/2023   AKI (acute kidney injury) (HCC) 02/02/2023   Long term current use of oral hypoglycemic drug 11/06/2022   Chronic right hip pain 11/06/2022   Hyperlipidemia 11/06/2022   Vestibular migraine 07/06/2018   Non-insulin  dependent type 2 diabetes mellitus (HCC) 06/18/2018   Asthma 04/21/2014   Menorrhagia 06/08/2012   Pelvic pain in female 06/08/2012   Essential hypertension 05/25/2012   Uterine fibroid  05/25/2012    PCP: [name], NP   REFERRING PROVIDER: Emeline Joesph BROCKS, DO   REFERRING DIAG: [phone] (ICD-10-CM) - Chronic right hip pain M53.3,G89.29 (ICD-10-CM) - Chronic right SI joint pain M79.7 (ICD-10-CM) - Fibromyalgia  THERAPY DIAG:  No diagnosis found.  RATIONALE FOR EVALUATION AND TREATMENT: Rehabilitation  ONSET DATE: ***  NEXT MD VISIT: ***   SUBJECTIVE:                                                                                                                                                                                                         SUBJECTIVE STATEMENT: 53 y/o  female referred to PT from [facility] for :  Evaluate and treat. Low back pain, poor standing tolerance/pain control.  She has had SI injections with no significant relief.  She has been here for PT in the past about a year ago.  MRI shows subtle anterolisthesis at L4-L5  without spinal stenosis but mild lateral recess and left foraminal stenosis, L5-S1 disc degeneration with a small rightward disc herniation most affecting the right lateral recess. Partially visible lower thoracic disc degeneration with up to mild lower thoracic spinal stenosis.   PAIN: Are you having pain? Yes: NPRS scale: *** Pain location: *** Pain description: *** Aggravating factors: *** Relieving factors: ***  PERTINENT HISTORY:  Acute cystitis (02/03/2023), AKI (acute kidney injury) (HCC) (02/02/2023), Asthma (04/21/2014), Chronic right hip pain (11/06/2022), Diabetes mellitus without complication (HCC), Essential hypertension (05/25/2012), GERD (gastroesophageal reflux disease) (02/03/2023), Hyperlipidemia (11/06/2022), Hypertension, Long term current use of oral hypoglycemic drug (11/06/2022), Menorrhagia (06/08/2012), Non-insulin  dependent type 2 diabetes mellitus (HCC) (06/18/2018), Pelvic pain in female (06/08/2012), Postural dizziness with presyncope (02/03/2023), Severe sepsis (HCC) (02/03/2023), Uterine  fibroid (05/25/2012), Vertigo, and Vestibular migraine (07/06/2018).  PRECAUTIONS: None  HAND DOMINANCE: {Hand Dominance:29389}  RED FLAGS: {PT Red Flags:29287}  WEIGHT BEARING RESTRICTIONS: No  FALLS:  Has patient fallen in last 6 months? {fallsyesno:27318}  LIVING ENVIRONMENT: Lives with: {[facility] lives with:25569::lives with their family} Lives in: {Lives in:25570} Stairs: {opstairs:27293} Has following equipment at home: {Assistive devices:23999}  OCCUPATION: ***  PLOF: {PLOF:24004}  PATIENT GOALS: ***    OBJECTIVE:   DIAGNOSTIC FINDINGS:  CLINICAL DATA:  53 year old female with low back pain. Right leg and hip pain for 6 months. Claudication.   EXAM: MRI LUMBAR SPINE WITHOUT CONTRAST   TECHNIQUE: Multiplanar, multisequence MR imaging of the lumbar spine was performed. No intravenous contrast was administered.   COMPARISON:  Lumbar radiographs 11/18/2022.   FINDINGS: Segmentation:  Normal on the comparison.   Alignment: Normal to mildly exaggerated lumbar lordosis. Subtle grade 1 anterolisthesis of L4 on L5 (only 2 mm on these images) was more conspicuous on the radiographs. No significant scoliosis.   Vertebrae: Visualized bone marrow signal is within normal limits. No marrow edema or evidence of acute osseous abnormality. Intact visible sacrum.   Conus medullaris and cauda equina: Conus extends to the L1 level. No lower spinal cord or conus signal abnormality.   Paraspinal and other soft tissues: Negative; large body habitus.   Disc levels:   Multilevel lower thoracic disc desiccation and disc bulging is partially visible (series 3, image 9). Up to mild lower thoracic spinal stenosis at the level of the lower spinal cord (T11-T12:), but no definite cord mass effect or signal abnormality.   T12-L1:  Negative.   L1-L2:  Negative.   L2-L3:  Negative disc.  Mild facet hypertrophy.  No stenosis.   L3-L4: Negative disc. Mild right foraminal disc  bulge and endplate spurring. Otherwise negative disc. Mild to moderate facet hypertrophy. Borderline to mild right L3 foraminal stenosis.   L4-L5: Subtle anterolisthesis. Moderate facet and ligament flavum hypertrophy. Degenerative facet joint fluid. No spinal stenosis. Up to mild lateral recess stenosis (L5 nerve levels) and left L4 neural foraminal stenosis.   L5-S1: Subtle disc desiccation. Broad-based central to right paracentral disc protrusion (series 6, image 38 and series 3, image 8). Mild facet hypertrophy. No spinal stenosis. Mild mass effect on the descending right S1 nerve roots in the lateral recess. Borderline to mild bilateral L5 foraminal stenosis.   IMPRESSION: 1. Subtle anterolisthesis at L4-L5  with at least moderate facet arthropathy. No spinal stenosis but mild lateral recess and left foraminal stenosis at that level. 2. L5-S1 disc degeneration with a small rightward disc herniation most affecting the right lateral recess. Query right S1 radiculitis. 3. Partially visible lower thoracic disc degeneration with up to mild lower thoracic spinal stenosis. Visible lower spinal cord at those levels seems to remain normal.     Electronically Signed   By: VEAR Hurst M.D.    Lumbar Xray:  On: 11/29/2022 09:56Ordering provider: Georgina Ozell LABOR, MD  06/05/23 1502Resulted by: Georgina Ozell LABOR, MD Performed: 06/05/23 1545 - 06/05/23 1545Accession number: [id number] Resulting lab: [facility] Narrative:  XRs of the lumbar spine from 06/05/2023 were independently reviewed and  interpreted, showing no significant degenerative changes.  No evidence of  instability on flexion/extension views.  No fracture or dislocation seen.  PATIENT SURVEYS:  {rehab surveys:24030}  SCREENING FOR RED FLAGS: Bowel or bladder incontinence: {Yes/No:304960894} Spinal tumors: {Yes/No:304960894} Cauda equina syndrome: {Yes/No:304960894} Compression fracture: {Yes/No:304960894} Abdominal  aneurysm: {Yes/No:304960894}  COGNITION: Overall cognitive status: {cognition:24006}     SENSATION: {sensation:27233}  POSTURE:  {posture:25561}  PALPATION: ***  CERVICAL ROM:   Active ROM Eval  Flexion   Extension   Right lateral flexion   Left lateral flexion   Right rotation   Left rotation     (Blank rows = not tested)  UPPER EXTREMITY ROM:  Active ROM Right eval Left eval  Shoulder flexion    Shoulder extension    Shoulder abduction    Shoulder adduction    Shoulder internal rotation    Shoulder external rotation    Elbow flexion    Elbow extension    Wrist flexion    Wrist extension    Wrist ulnar deviation    Wrist radial deviation    Wrist pronation    Wrist supination      (Blank rows = not tested)  UPPER EXTREMITY MMT:  MMT Right eval Left eval  Shoulder flexion    Shoulder extension    Shoulder abduction    Shoulder adduction    Shoulder internal rotation    Shoulder external rotation    Middle trapezius    Lower trapezius    Elbow flexion    Elbow extension    Wrist flexion    Wrist extension    Wrist ulnar deviation    Wrist radial deviation    Wrist pronation    Wrist supination    Grip strength    (Blank rows = not tested)  LUMBAR ROM:   Active  Eval  Flexion   Extension   Right lateral flexion   Left lateral flexion   Right rotation   Left rotation     (Blank rows = not tested)  MUSCLE LENGTH: Hamstrings: Right *** deg; Left *** deg Thomas test: Right *** deg; Left *** deg Hamstrings: *** ITB: *** Piriformis: *** Hip flexors: *** Quads: *** Heelcord: ***  LOWER EXTREMITY ROM:     {AROM/PROM:27142}  Right eval Left eval  Hip flexion    Hip extension    Hip abduction    Hip adduction    Hip internal rotation    Hip external rotation    Knee flexion    Knee extension    Ankle dorsiflexion    Ankle plantarflexion    Ankle inversion    Ankle eversion     (Blank rows = not tested)  LOWER EXTREMITY MMT:     MMT Right  eval Left eval  Hip flexion    Hip extension    Hip abduction    Hip adduction    Hip internal rotation    Hip external rotation    Knee flexion    Knee extension    Ankle dorsiflexion    Ankle plantarflexion    Ankle inversion    Ankle eversion      (Blank rows = not tested)  CERVICAL SPECIAL TESTS:  {Cervical special tests:25246}  LUMBAR SPECIAL TESTS:  {lumbar special test:25242}  FUNCTIONAL TESTS:  {Functional tests:24029}  GAIT: Distance walked: *** Assistive device utilized: {Assistive devices:23999} Level of assistance: {Levels of assistance:24026} Gait pattern: {gait characteristics:25376} Comments: ***   TODAY'S TREATMENT:   ***   PATIENT EDUCATION:  Education details: PT eval findings, anticipated POC, and initial HEP  Person educated: Patient Education method: Explanation, Demonstration, Verbal cues, Tactile cues, and Handouts Education comprehension: verbalized understanding, verbal cues required, tactile cues required, and needs further education  HOME EXERCISE PROGRAM: ***   ASSESSMENT:  CLINICAL IMPRESSION: Valecia Beske Steed-Matthews is a 53 y.o. female who was referred to physical therapy for evaluation and treatment for LBP.   Patient reports onset of *** pain beginning ***. Pain is worse with ***.  Patient has deficits in *** ROM, *** flexibility, *** strength, ***abnormal posture, and TTP with abnormal muscle tension *** which are interfering with ADLs and are impacting quality of life.  On NDI patient scored ***/50 demonstrating ***% or *** disability.  On Modified Oswestry patient scored ***/50 demonstrating ***% or *** disability.  Laverle will benefit from skilled PT to address above deficits to improve mobility and activity tolerance with decreased pain interference.  OBJECTIVE IMPAIRMENTS: {opptimpairments:25111}.   ACTIVITY LIMITATIONS: {activitylimitations:27494}  PARTICIPATION LIMITATIONS:  {participationrestrictions:25113}  PERSONAL FACTORS: Age, Time since onset of injury/illness/exacerbation, and 1-2 comorbidities:  Acute cystitis (02/03/2023), AKI (acute kidney injury) (HCC) (02/02/2023), Asthma (04/21/2014), Chronic right hip pain (11/06/2022), Diabetes mellitus without complication (HCC), Essential hypertension (05/25/2012), GERD (gastroesophageal reflux disease) (02/03/2023), Hyperlipidemia (11/06/2022), Hypertension, Long term current use of oral hypoglycemic drug (11/06/2022), Menorrhagia (06/08/2012), Non-insulin  dependent type 2 diabetes mellitus (HCC) (06/18/2018), Pelvic pain in female (06/08/2012), Postural dizziness with presyncope (02/03/2023), Severe sepsis (HCC) (02/03/2023), Uterine fibroid (05/25/2012), Vertigo, and Vestibular migraine (07/06/2018). are also affecting patient's functional outcome.   REHAB POTENTIAL: Good  CLINICAL DECISION MAKING: Evolving/moderate complexity  EVALUATION COMPLEXITY: Moderate   GOALS: Goals reviewed with patient? Yes  SHORT TERM GOALS: Target date: ***  Patient will be independent with initial HEP to improve outcomes and carryover.  Baseline: *** Goal status: {GOALSTATUS:25110}  2.  Patient will report 25% improvement in neck and *** back pain to improve QOL. Baseline: *** Goal status: {GOALSTATUS:25110}  3.  *** Baseline: *** Goal status: {GOALSTATUS:25110}  LONG TERM GOALS: Target date: ***  Patient will be independent with ongoing/advanced HEP for self-management at home.  Baseline: *** Goal status: {GOALSTATUS:25110}  2.  Patient will report 50-75% improvement in neck and *** back pain to improve QOL.  Baseline: *** Goal status: {GOALSTATUS:25110}  3.  Patient will demonstrate improved posture to decrease muscle imbalance. Baseline: *** Goal status: {GOALSTATUS:25110}  4.  Patient to demonstrate ability to achieve and maintain good spinal alignment and body mechanics needed for daily  activities. Baseline: *** Goal status: {GOALSTATUS:25110}  5.  Patient will demonstrate full pain free cervical ROM for safety with driving.  Baseline: *** Goal status: {GOALSTATUS:25110}  6.  Patient will demonstrate full pain free lumbar ROM to perform  ADLs.   Baseline: *** Goal status: {GOALSTATUS:25110}  7.  Patient will demonstrate improved functional strength as demonstrated by ***. Baseline: *** Goal status: {GOALSTATUS:25110}  8.  Patient will report </= ***% on NDI (MCID = 22%) to demonstrate improved functional ability.  Baseline: *** Goal status: {GOALSTATUS:25110}   9.  Patient will report </= ***% on Modified Oswestry (MCID = 12%) to demonstrate improved functional ability.  Baseline:  Goal status: {GOALSTATUS:25110}  10.  Patient to report ability to perform ADLs, household, and work-related tasks without limitation due to *** pain, LOM or weakness Baseline: *** Goal status: {GOALSTATUS:25110}   11.  Patient will tolerate *** min of (standing/sitting/walking) to perform ***. Baseline: *** Goal status: {GOALSTATUS:25110}  12.  Patient will report centralization of radicular symptoms.  Baseline: *** Goal status: {GOALSTATUS:25110}  13.  *** Baseline: *** Goal status: {GOALSTATUS:25110}   PLAN:  PT FREQUENCY: 1-2x/week  PT DURATION: 8 weeks  PLANNED INTERVENTIONS: 97164- PT Re-evaluation, 97750- Physical Performance Testing, 97110-Therapeutic exercises, 97530- Therapeutic activity, 97112- Neuromuscular re-education, 97535- Self Care, 02859- Manual therapy, [id number]- Aquatic Therapy, H9716- Electrical stimulation (unattended), 97016- Vasopneumatic device, [id number]- Ultrasound, [id number]- Traction (mechanical), [id number]- Ionotophoresis 4mg /ml Dexamethasone, 79439 (1-2 muscles), 20561 (3+ muscles)- Dry Needling, Patient/Family education, Balance training, Stair training, Taping, Joint mobilization, Spinal mobilization, Cryotherapy, and Moist heat  PLAN FOR NEXT SESSION:  ***   Pantelis Elgersma, PT 02/02/2024, 8:25 AM

## 2024-02-05 ENCOUNTER — Ambulatory Visit: Attending: Physical Medicine and Rehabilitation | Admitting: Rehabilitation

## 2024-02-05 DIAGNOSIS — G8929 Other chronic pain: Secondary | ICD-10-CM | POA: Insufficient documentation

## 2024-02-05 DIAGNOSIS — M25651 Stiffness of right hip, not elsewhere classified: Secondary | ICD-10-CM | POA: Insufficient documentation

## 2024-02-05 DIAGNOSIS — M5441 Lumbago with sciatica, right side: Secondary | ICD-10-CM | POA: Insufficient documentation

## 2024-02-05 DIAGNOSIS — R262 Difficulty in walking, not elsewhere classified: Secondary | ICD-10-CM | POA: Insufficient documentation

## 2024-02-05 DIAGNOSIS — M6281 Muscle weakness (generalized): Secondary | ICD-10-CM | POA: Insufficient documentation

## 2024-02-05 DIAGNOSIS — R293 Abnormal posture: Secondary | ICD-10-CM | POA: Insufficient documentation

## 2024-02-08 ENCOUNTER — Other Ambulatory Visit: Payer: Self-pay | Admitting: Orthopedic Surgery

## 2024-02-13 NOTE — Therapy (Incomplete)
 OUTPATIENT PHYSICAL THERAPY THORACOLUMBAR & LOWER EXTREMITY EVALUATION   Patient Name: Lauren Soto MRN: [id number] DOB:1971-03-22, 53 y.o., female Today's Date: 02/13/2024  END OF SESSION:   Past Medical History:  Diagnosis Date   Acute cystitis 02/03/2023   AKI (acute kidney injury) (HCC) 02/02/2023   Asthma 04/21/2014   Chronic right hip pain 11/06/2022   Diabetes mellitus without complication (HCC)    Essential hypertension 05/25/2012   GERD (gastroesophageal reflux disease) 02/03/2023   Hyperlipidemia 11/06/2022   Hypertension    Long term current use of oral hypoglycemic drug 11/06/2022   Menorrhagia 06/08/2012   Non-insulin  dependent type 2 diabetes mellitus (HCC) 06/18/2018   Pelvic pain in female 06/08/2012   Postural dizziness with presyncope 02/03/2023   Severe sepsis (HCC) 02/03/2023   Uterine fibroid 05/25/2012   Vertigo    Vestibular migraine 07/06/2018   Past Surgical History:  Procedure Laterality Date   ABDOMINAL HYSTERECTOMY     BUNIONECTOMY     CHOLECYSTECTOMY     HERNIA REPAIR     umblical   SHOULDER ARTHROSCOPY     Patient Active Problem List   Diagnosis Date Noted   Dysuria 01/19/2024   Polyarthralgia 11/27/2023   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   Fibromyalgia 09/10/2023   Other insomnia 09/10/2023   Obesity (BMI 35.0-39.9 without comorbidity) 03/19/2023   Vertigo    Severe sepsis (HCC) 02/03/2023   Postural dizziness with presyncope 02/03/2023   GERD (gastroesophageal reflux disease) 02/03/2023   Acute cystitis 02/03/2023   AKI (acute kidney injury) (HCC) 02/02/2023   Long term current use of oral hypoglycemic drug 11/06/2022   Chronic right hip pain 11/06/2022   Hyperlipidemia 11/06/2022   Vestibular migraine 07/06/2018   Non-insulin  dependent type 2 diabetes mellitus (HCC) 06/18/2018   Asthma 04/21/2014   Menorrhagia 06/08/2012   Pelvic pain in female 06/08/2012   Essential hypertension 05/25/2012   Uterine  fibroid 05/25/2012    PCP: [name], NP   REFERRING PROVIDER: Emeline Joesph BROCKS, DO   REFERRING DIAG:  [phone] (ICD-10-CM) - Chronic right hip pain  M53.3,G89.29 (ICD-10-CM) - Chronic right SI joint pain  M79.7 (ICD-10-CM) - Fibromyalgia   THERAPY DIAG:  No diagnosis found.  RATIONALE FOR EVALUATION AND TREATMENT: Rehabilitation  ONSET DATE: ***Chronic x ***  NEXT MD VISIT: 04/21/24   SUBJECTIVE:  SUBJECTIVE STATEMENT: ***  PAIN: Are you having pain? {OPRCPAIN:27236}  PERTINENT HISTORY:  ***Asthma, DM-II, GERD, HTN, postural dizziness with presyncope, vertigo, vestibular migraine, fibromyalgia, abdominal hysterectomy, obesity  PRECAUTIONS: {Therapy precautions:24002}  RED FLAGS: {PT Red Flags:29287}  WEIGHT BEARING RESTRICTIONS: {Yes ***/No:24003}  FALLS:  Has patient fallen in last 6 months? {fallsyesno:27318}  LIVING ENVIRONMENT: Lives with: {OPRC lives with:25569::lives with their family} Lives in: {Lives in:25570} Stairs: {opstairs:27293} Has following equipment at home: {Assistive devices:23999}  OCCUPATION: ***  PLOF: {PLOF:24004}  PATIENT GOALS: ***   OBJECTIVE: (objective measures completed at initial evaluation unless otherwise dated)  DIAGNOSTIC FINDINGS:  ***06/05/23 - XRs of the lumbar spine from 06/05/2023 were independently reviewed and  Interpreted by Ozell Ada, MD, showing no significant degenerative changes.  No evidence of instability on flexion/extension views.  No fracture or dislocation seen.   PATIENT SURVEYS:  LEFS: ***  Modified Oswestry: ***  Interpretation of scores: Score Category Description  0-20% Minimal Disability The patient can cope with most living activities. Usually no treatment is indicated apart  from advice on lifting, sitting and exercise  21-40% Moderate Disability The patient experiences more pain and difficulty with sitting, lifting and standing. Travel and social life are more difficult and they may be disabled from work. Personal care, sexual activity and sleeping are not grossly affected, and the patient can usually be managed by conservative means  41-60% Severe Disability Pain remains the main problem in this group, but activities of daily living are affected. These patients require a detailed investigation  61-80% Crippled Back pain impinges on all aspects of the patient's life. Positive intervention is required  81-100% Bed-bound  These patients are either bed-bound or exaggerating their symptoms  Bluford FORBES Zoe DELENA Karon DELENA, et al. Surgery versus conservative management of stable thoracolumbar fracture: the PRESTO feasibility RCT. [place] ([place]): [employer]; 2021 Nov. [facility] [employer] Assessment, No. 25.62.) Appendix 3, Oswestry Disability Index category descriptors. Available from: FindJewel[employer].cz  Minimally Clinically Important Difference (MCID) = 12.8%  SCREENING FOR RED FLAGS: Bowel or bladder incontinence: {Yes/No:304960894} Spinal tumors: {Yes/No:304960894} Cauda equina syndrome: {Yes/No:304960894} Compression fracture: {Yes/No:304960894} Abdominal aneurysm: {Yes/No:304960894}  COGNITION:  Overall cognitive status: {cognition:24006}    SENSATION: {sensation:27233}  POSTURE:  {posture:25561}  PALPATION: ***  LUMBAR ROM:   {AROM/PROM:27142}  Eval  Flexion   Extension   Right lateral flexion   Left lateral flexion   Right rotation   Left rotation   (Blank rows = not tested)  MUSCLE LENGTH: Hamstrings: Right *** deg; Left *** deg Thomas test: Right *** deg; Left *** deg Hamstrings: *** ITB: *** Piriformis: *** Hip flexors: *** Quads: *** Heelcord: ***  LOWER EXTREMITY ROM:     {AROM/PROM:27142}   Right eval Left eval  Hip flexion    Hip extension    Hip abduction    Hip adduction    Hip internal rotation    Hip external rotation    Knee flexion    Knee extension    Ankle dorsiflexion    Ankle plantarflexion    Ankle inversion    Ankle eversion    (Blank rows = not tested)  LOWER EXTREMITY MMT:    MMT Right eval Left eval  Hip flexion    Hip extension    Hip abduction    Hip adduction    Hip internal rotation    Hip external rotation    Knee flexion    Knee extension    Ankle dorsiflexion    Ankle plantarflexion  Ankle inversion    Ankle eversion     (Blank rows = not tested)  LUMBAR SPECIAL TESTS:  {lumbar special test:25242}  FUNCTIONAL TESTS:  {Functional tests:24029}  GAIT: Distance walked: *** Assistive device utilized: {Assistive devices:23999} Level of assistance: {Levels of assistance:24026} Gait pattern: {gait characteristics:25376} Comments: ***   TODAY'S TREATMENT:   ***   PATIENT EDUCATION:  Education details: {Education details:27468}  Person educated: {Person educated:25204} Education method: {Education Method EU:67125} Education comprehension: {Education Comprehension:25206}  HOME EXERCISE PROGRAM: ***   ASSESSMENT:  CLINICAL IMPRESSION: Mishayla Sliwinski Steed-Matthews is a 53 y.o. female who was referred to physical therapy for evaluation and treatment for chronic LBP, R SIJ and hip pain.  ***   Patient reports onset of *** pain beginning ***. Pain is worse with ***.  Patient has deficits in *** ROM, *** LE flexibility, *** strength, abnormal posture, and TTP with abnormal muscle tension *** which are interfering with ADLs and are impacting quality of life.  On Modified Oswestry patient scored ***/50 demonstrating ***% or *** disability.  K[place]a will benefit from skilled PT to address above deficits to improve mobility and activity tolerance with decreased pain interference.   ***  OBJECTIVE IMPAIRMENTS: {opptimpairments:25111}.    ACTIVITY LIMITATIONS: {activitylimitations:27494}  PARTICIPATION LIMITATIONS: {participationrestrictions:25113}  PERSONAL FACTORS: {Personal factors:25162} are also affecting patient's functional outcome.   REHAB POTENTIAL: {rehabpotential:25112}  CLINICAL DECISION MAKING: {clinical decision making:25114}  EVALUATION COMPLEXITY: {Evaluation complexity:25115}   GOALS: Goals reviewed with patient? {yes/no:20286}  SHORT TERM GOALS: Target date: ***  Patient will be independent with initial HEP to improve outcomes and carryover.  Baseline: *** Goal status: {GOALSTATUS:25110}  2.  Patient will report 25% improvement in low back pain to improve QOL. Baseline: *** Goal status: {GOALSTATUS:25110}  3.  *** Baseline:  Goal status: {GOALSTATUS:25110}   LONG TERM GOALS: Target date: ***  Patient will be independent with ongoing/advanced HEP for self-management at home.  Baseline: *** Goal status: {GOALSTATUS:25110}  2.  Patient will report 50-75% improvement in low back pain to improve QOL.  Baseline: *** Goal status: {GOALSTATUS:25110}  3.  Patient to demonstrate ability to achieve and maintain good spinal alignment/posturing and body mechanics needed for daily activities. Baseline: *** Goal status: {GOALSTATUS:25110}  4.  Patient will demonstrate full pain free lumbar ROM to perform ADLs.   Baseline: Refer to above lumbar ROM table Goal status: {GOALSTATUS:25110}  5.  Patient will demonstrate improved *** strength to >/= ***/5 for improved stability and ease of mobility. Baseline: Refer to above LE MMT table Goal status: {GOALSTATUS:25110}  6. Patient will report </= ***% on Modified Oswestry (MCID = 12%) to demonstrate improved functional ability with decreased pain interference. Baseline: *** Goal status: {GOALSTATUS:25110}  7.  Patient will tolerate *** min of (standing/sitting/walking) w/o increased pain to allow for *** improved mobility and activity  tolerance. Baseline: *** Goal status: {GOALSTATUS:25110}  8.  Patient will report centralization of radicular symptoms.  Baseline: *** Goal status: {GOALSTATUS:25110}  9.  Patient will ***.  Baseline: *** Goal status: {GOALSTATUS:25110}   10.  *** Baseline: *** Goal status: {GOALSTATUS:25110}    PLAN:  PT FREQUENCY: {rehab frequency:25116}  PT DURATION: {rehab duration:25117}  PLANNED INTERVENTIONS: {rehab planned interventions:25118::97110-Therapeutic exercises,97530- Therapeutic [phone]- Neuromuscular re-education,97535- Self Rjmz,02859- Manual therapy}  PLAN FOR NEXT SESSION: ***   Elijah CHRISTELLA Hidden, PT 02/13/2024, 11:34 AM

## 2024-02-23 ENCOUNTER — Ambulatory Visit: Admitting: Rehabilitation

## 2024-02-23 ENCOUNTER — Other Ambulatory Visit: Payer: Self-pay

## 2024-02-23 ENCOUNTER — Encounter: Payer: Self-pay | Admitting: Rehabilitation

## 2024-02-23 DIAGNOSIS — G8929 Other chronic pain: Secondary | ICD-10-CM | POA: Diagnosis present

## 2024-02-23 DIAGNOSIS — M25651 Stiffness of right hip, not elsewhere classified: Secondary | ICD-10-CM

## 2024-02-23 DIAGNOSIS — M6281 Muscle weakness (generalized): Secondary | ICD-10-CM | POA: Diagnosis present

## 2024-02-23 DIAGNOSIS — R293 Abnormal posture: Secondary | ICD-10-CM | POA: Diagnosis present

## 2024-02-23 DIAGNOSIS — R262 Difficulty in walking, not elsewhere classified: Secondary | ICD-10-CM | POA: Diagnosis present

## 2024-02-23 DIAGNOSIS — M5441 Lumbago with sciatica, right side: Secondary | ICD-10-CM | POA: Diagnosis present

## 2024-02-23 NOTE — Therapy (Signed)
 OUTPATIENT PHYSICAL THERAPY THORACOLUMBAR EVALUATION   Patient Name: Lauren Soto MRN: [id number] DOB:24-May-1971, 53 y.o., female Today's Date: 02/24/2024  END OF SESSION:  PT End of Session - 02/23/24 1625     Visit Number 1    Date for PT Re-Evaluation 04/19/24    PT Start Time 1618    Activity Tolerance Patient tolerated treatment well    Behavior During Therapy [facility] for tasks assessed/performed          Past Medical History:  Diagnosis Date   Acute cystitis 02/03/2023   AKI (acute kidney injury) (HCC) 02/02/2023   Asthma 04/21/2014   Chronic right hip pain 11/06/2022   Diabetes mellitus without complication (HCC)    Essential hypertension 05/25/2012   GERD (gastroesophageal reflux disease) 02/03/2023   Hyperlipidemia 11/06/2022   Hypertension    Long term current use of oral hypoglycemic drug 11/06/2022   Menorrhagia 06/08/2012   Non-insulin  dependent type 2 diabetes mellitus (HCC) 06/18/2018   Pelvic pain in female 06/08/2012   Postural dizziness with presyncope 02/03/2023   Severe sepsis (HCC) 02/03/2023   Uterine fibroid 05/25/2012   Vertigo    Vestibular migraine 07/06/2018   Past Surgical History:  Procedure Laterality Date   ABDOMINAL HYSTERECTOMY     BUNIONECTOMY     CHOLECYSTECTOMY     HERNIA REPAIR     umblical   SHOULDER ARTHROSCOPY     Patient Active Problem List   Diagnosis Date Noted   Dysuria 01/19/2024   Polyarthralgia 11/27/2023   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   Fibromyalgia 09/10/2023   Other insomnia 09/10/2023   Obesity (BMI 35.0-39.9 without comorbidity) 03/19/2023   Vertigo    Severe sepsis (HCC) 02/03/2023   Postural dizziness with presyncope 02/03/2023   GERD (gastroesophageal reflux disease) 02/03/2023   Acute cystitis 02/03/2023   AKI (acute kidney injury) (HCC) 02/02/2023   Long term current use of oral hypoglycemic drug 11/06/2022   Chronic right hip pain 11/06/2022   Hyperlipidemia 11/06/2022    Vestibular migraine 07/06/2018   Non-insulin  dependent type 2 diabetes mellitus (HCC) 06/18/2018   Asthma 04/21/2014   Menorrhagia 06/08/2012   Pelvic pain in female 06/08/2012   Essential hypertension 05/25/2012   Uterine fibroid 05/25/2012    PCP: [name], NP   REFERRING PROVIDER: Emeline Joesph BROCKS, DO   REFERRING DIAG: [id number] (ICD-10-CM) - Chronic right hip pain M53.3,G89.29 (ICD-10-CM) - Chronic right SI joint pain M79.7 (ICD-10-CM) - Fibromyalgia  THERAPY DIAG:  Chronic right-sided low back pain with right-sided sciatica  Muscle weakness (generalized)  Stiffness of right hip, not elsewhere classified  Abnormal posture  Difficulty in walking, not elsewhere classified  RATIONALE FOR EVALUATION AND TREATMENT: Rehabilitation  ONSET DATE: years  NEXT MD VISIT: unknown   SUBJECTIVE:  SUBJECTIVE STATEMENT: Patient is a 53 y/o referred to PT for chronic R hip pain, chronic R SI jt pain, and fibromyalgia.  She states her pain has been going on for a long time now and is just getting worse.  She has seen us  in the past but has not been doing any of her home exercises from previously.  She reports constant pain in her low back with radiation into her R hip and posterolateral thigh that varies in intensity.  She works at [employer] and has a physical job requiring lifting, bending, twisting.  She reports pain gets worse as the day goes on but that she also has night pain with difficulty sleeping over 4 hrs at a times due to the pain.  She is taking Lyrica  but doesn't feel it is giving her much relief.  She has had injections in the back, SI, and R posterior trochanter per her report but states they didn't help much either.  Last MD note states not planning for any  further injections due to no relief of symptoms.   PAIN: Are you having pain? No and Yes: NPRS scale: 8/10 now; 4/10 best; 10/10 worst Pain location: R low back, R hip, R SI, R posterolateral thigh to mid thigh level Pain description: aching, stabbing Aggravating factors: Prolonged standing, bending, lifting Relieving factors: resting, meds  PERTINENT HISTORY:  Diabetes, HTN, vertigo, postural dizziness/presyncope  PRECAUTIONS: None  RED FLAGS: None  WEIGHT BEARING RESTRICTIONS: No  FALLS:  Has patient fallen in last 6 months? Yes. Number of falls twice this year;  tripped over dog once, other time possibly in the kitchen at school.    LIVING ENVIRONMENT: Lives with: lives with their family Lives in: House/apartment Stairs: No Has following equipment at home: None  OCCUPATION: Personnel officer at [employer];  [employer]; works in [employer], mining doing heavy lifting, standing  PLOF: Independent with gait  PATIENT GOALS: not hurt so much   OBJECTIVE: (objective measures completed at initial evaluation unless otherwise dated)  DIAGNOSTIC FINDINGS:  . Subtle anterolisthesis at L4-L5 with at least moderate facet arthropathy. No spinal stenosis but mild lateral recess and left foraminal stenosis at that level. 2. L5-S1 disc degeneration with a small rightward disc herniation most affecting the right lateral recess. Query right S1 radiculitis. 3. Partially visible lower thoracic disc degeneration with up to mild lower thoracic spinal stenosis.   PATIENT SURVEYS:  ODI = 26 / 50 or 52 %  SCREENING FOR RED FLAGS: Bowel or bladder incontinence: No Spinal tumors: No Cauda equina syndrome: No Compression fracture: No Abdominal aneurysm: No  COGNITION:  Overall cognitive status: Within functional limits for tasks assessed    SENSATION: WFL  POSTURE:  Valgus knees, leans to the R, some R lateral shift, hyperlordotic  PALPATION: TTP over central lumbar spine, R  lumbar spine, B PSIS, R hip, R greater trochanter posteriorly, R IT band  LUMBAR ROM:   Active  Eval  Flexion To knees, increased p!  Extension 50%; more p!  Right lateral flexion To mid thigh; p!  Left lateral flexion To mid thigh; p!  Right rotation 60%; p!  Left rotation 40%; mild p!  (Blank rows = not tested)  MUSCLE LENGTH: Hamstrings: Right SLR 50 deg; Left SLR 65 deg Thomas test: Right 0 deg; Left 0 deg Hamstrings: severe tightness BLE ITB: min tightness BLE Piriformis: mod tightness BLE Hip flexors:  Quads:  Heelcord:   LOWER EXTREMITY ROM:  Active  Right eval Left eval  Hip flexion    Hip extension    Hip abduction    Hip adduction    Hip internal rotation 30 40  Hip external rotation 50 50  Knee flexion    Knee extension    Ankle dorsiflexion    Ankle plantarflexion    Ankle inversion    Ankle eversion    (Blank rows = not tested)  LOWER EXTREMITY MMT:    MMT Right eval Left eval  Hip flexion 4- 4  Hip extension    Hip abduction 4- 4-  Hip adduction    Hip internal rotation 4- 4  Hip external rotation 4 4  Knee flexion 5 5  Knee extension 5 5  Ankle dorsiflexion 4 4  Ankle plantarflexion    Ankle inversion    Ankle eversion     (Blank rows = not tested)  LUMBAR SPECIAL TESTS:  Straight leg raise test: Positive, Slump test: Negative, FABER test: Negative, and Gaenslen's test: Negative  FUNCTIONAL TESTS:  TBD  GAIT: Distance walked: 200  feet into clinic Assistive device utilized: None Level of assistance: Complete Independence Gait pattern: decreased step length- Left, decreased stance time- Right, and lateral lean- Right Comments: leans to the R, limps mildly on RLE; very hyperlordotic with poor core and hip control   TODAY'S TREATMENT:  02/23/24 SELF CARE: Provided education on PT POC progression, on pain management options, to promote safe home environment, and lifting safety; initial hep.   PATIENT EDUCATION:  Education  details: PT eval findings, anticipated POC, and initial HEP  Person educated: Patient Education method: Explanation, Demonstration, Verbal cues, Tactile cues, and Handouts Education comprehension: verbalized understanding, verbal cues required, tactile cues required, and needs further education  HOME EXERCISE PROGRAM: Access Code: [email or web address] URL: [email or web address] Date: 02/23/2024 Prepared by: Garnette Montclair  Exercises - Straight Leg Raise Sciatic Nerve Flossing  - 1 x daily - 7 x weekly - 1 sets - 2 reps - 1 min hold - Supine Piriformis Stretch  - 1 x daily - 7 x weekly - 1 sets - 2 reps - 1 min hold - Supine Lower Trunk Rotation  - 1 x daily - 7 x weekly - 3 sets - 10 reps   ASSESSMENT:  CLINICAL IMPRESSION: Lauren Soto is a 53 y.o. female who was referred to physical therapy for evaluation and treatment for R hip pain, R SI pain, and fibromyalgia   Patient reports onset of back and RLE pain beginning years ago. Pain is worse with prolonged standing, walking, bending, lifting.   She also has pain at night with disturbed sleep.  It is difficult to differentiate the sources of her pain since they are widespread over her back, hip, SI, trochanteric, IT, and over the buttock in general.    Patient has deficits in lumbar ROM,  B LE flexibility, BLE strength, abnormal posture, and TTP with abnormal muscle tension in the R hip which are interfering with ADLs and are impacting quality of life.  On Modified Oswestry patient scored 26 / 50 or 52 % disability.  Levie will benefit from skilled PT to address above deficits to improve mobility and activity tolerance with decreased pain interference.     OBJECTIVE IMPAIRMENTS: difficulty walking, decreased ROM, decreased strength, impaired flexibility, postural dysfunction, and pain.   ACTIVITY LIMITATIONS: carrying, lifting, bending, standing, sleeping, and locomotion level  PARTICIPATION LIMITATIONS: meal prep, cleaning,  laundry, community activity, and occupation  PERSONAL FACTORS: Age, Fitness,  Time since onset of injury/illness/exacerbation, and 1-2 comorbidities: Diabetes, HTN, vertigo, postural dizziness/presyncope are also affecting patient's functional outcome.   REHAB POTENTIAL: Good  CLINICAL DECISION MAKING: Evolving/moderate complexity  EVALUATION COMPLEXITY: Moderate   GOALS: Goals reviewed with patient? Yes  SHORT TERM GOALS: Target date: 03/23/2024   Patient will be independent with initial HEP to improve outcomes and carryover.  Baseline: 100% PT assist required for correct completion Goal status: INITIAL  2.  Patient will report 25% improvement in low back pain to improve QOL. Baseline: 10/10 worst Goal status: INITIAL LONG TERM GOALS: Target date: 04/20/2024   Patient will be independent with ongoing/advanced HEP for self-management at home.  Baseline: no advanced HEP yet Goal status: INITIAL  2.  Patient will report 50-75% improvement in low back pain to improve QOL.  Baseline: 10/10 worst Goal status: INITIAL  3.  Patient to demonstrate ability to achieve and maintain good spinal alignment/posturing and body mechanics needed for daily activities. Baseline: hyperlordotic with poor abdominal control Goal status: INITIAL  4.  Patient will demonstrate full pain free lumbar ROM to perform ADLs.   Baseline: Refer to above lumbar ROM table Goal status: INITIAL  5.  Patient will demonstrate improved BLE strength to >/= 4 to 4+/5 for improved stability and ease of mobility. Baseline: Refer to above LE MMT table Goal status: INITIAL  6. Patient will report </= 35% on Modified Oswestry (MCID = 12%) to demonstrate improved functional ability with decreased pain interference. Baseline: 26 / 50 or 52 % Goal status: INITIAL  7.  Patient will tolerate 6 hrs of (standing/sitting/walking) w/o increased pain to allow for  improved mobility and activity tolerance to be able to work  her job full duty. Baseline: works but is in pain all day Goal status: INITIAL  8.  Patient will report centralization of radicular symptoms.  Baseline: reports pain into the R hip/thigh Goal status: INITIAL PLAN:  PT FREQUENCY: 1-2x/week  PT DURATION: 8 weeks  PLANNED INTERVENTIONS: 97110-Therapeutic exercises, 97530- Therapeutic activity, 97112- Neuromuscular re-education, 97535- Self Care, 02859- Manual therapy, G0283- Electrical stimulation (unattended), 97016- Vasopneumatic device, [id number]- Ultrasound, [id number]- Traction (mechanical), [id number]- Ionotophoresis 4mg /ml Dexamethasone, 79439 (1-2 muscles), 20561 (3+ muscles)- Dry Needling, Patient/Family education, Balance training, Stair training, Taping, Joint mobilization, Spinal mobilization, Cryotherapy, and Moist heat  PLAN FOR NEXT SESSION: progress with lumbar and R hip strengthening activities   Kaylin Schellenberg, PT 02/24/2024, 1:39 PM

## 2024-02-25 ENCOUNTER — Ambulatory Visit: Admitting: Rehabilitation

## 2024-02-25 DIAGNOSIS — R262 Difficulty in walking, not elsewhere classified: Secondary | ICD-10-CM

## 2024-02-25 DIAGNOSIS — M5441 Lumbago with sciatica, right side: Secondary | ICD-10-CM | POA: Diagnosis not present

## 2024-02-25 DIAGNOSIS — M25651 Stiffness of right hip, not elsewhere classified: Secondary | ICD-10-CM

## 2024-02-25 DIAGNOSIS — R293 Abnormal posture: Secondary | ICD-10-CM

## 2024-02-25 DIAGNOSIS — G8929 Other chronic pain: Secondary | ICD-10-CM

## 2024-02-25 DIAGNOSIS — M6281 Muscle weakness (generalized): Secondary | ICD-10-CM

## 2024-02-25 NOTE — Therapy (Signed)
 OUTPATIENT PHYSICAL THERAPY THORACOLUMBAR EVALUATION   Patient Name: Lauren Soto MRN: [id number] DOB:1971/06/09, 53 y.o., female Today's Date: 02/25/2024  END OF SESSION:  PT End of Session - 02/25/24 1635     Visit Number 2    Date for PT Re-Evaluation 04/19/24    PT Start Time 1620    PT Stop Time 1705    PT Time Calculation (min) 45 min    Activity Tolerance Patient tolerated treatment well    Behavior During Therapy [facility] for tasks assessed/performed          Past Medical History:  Diagnosis Date   Acute cystitis 02/03/2023   AKI (acute kidney injury) (HCC) 02/02/2023   Asthma 04/21/2014   Chronic right hip pain 11/06/2022   Diabetes mellitus without complication (HCC)    Essential hypertension 05/25/2012   GERD (gastroesophageal reflux disease) 02/03/2023   Hyperlipidemia 11/06/2022   Hypertension    Long term current use of oral hypoglycemic drug 11/06/2022   Menorrhagia 06/08/2012   Non-insulin  dependent type 2 diabetes mellitus (HCC) 06/18/2018   Pelvic pain in female 06/08/2012   Postural dizziness with presyncope 02/03/2023   Severe sepsis (HCC) 02/03/2023   Uterine fibroid 05/25/2012   Vertigo    Vestibular migraine 07/06/2018   Past Surgical History:  Procedure Laterality Date   ABDOMINAL HYSTERECTOMY     BUNIONECTOMY     CHOLECYSTECTOMY     HERNIA REPAIR     umblical   SHOULDER ARTHROSCOPY     Patient Active Problem List   Diagnosis Date Noted   Dysuria 01/19/2024   Polyarthralgia 11/27/2023   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   Fibromyalgia 09/10/2023   Other insomnia 09/10/2023   Obesity (BMI 35.0-39.9 without comorbidity) 03/19/2023   Vertigo    Severe sepsis (HCC) 02/03/2023   Postural dizziness with presyncope 02/03/2023   GERD (gastroesophageal reflux disease) 02/03/2023   Acute cystitis 02/03/2023   AKI (acute kidney injury) (HCC) 02/02/2023   Long term current use of oral hypoglycemic drug 11/06/2022   Chronic right  hip pain 11/06/2022   Hyperlipidemia 11/06/2022   Vestibular migraine 07/06/2018   Non-insulin  dependent type 2 diabetes mellitus (HCC) 06/18/2018   Asthma 04/21/2014   Menorrhagia 06/08/2012   Pelvic pain in female 06/08/2012   Essential hypertension 05/25/2012   Uterine fibroid 05/25/2012    PCP: [name], NP   REFERRING PROVIDER: Emeline Joesph BROCKS, DO   REFERRING DIAG: [phone] (ICD-10-CM) - Chronic right hip pain M53.3,G89.29 (ICD-10-CM) - Chronic right SI joint pain M79.7 (ICD-10-CM) - Fibromyalgia  THERAPY DIAG:  Chronic right-sided low back pain with right-sided sciatica  Muscle weakness (generalized)  Stiffness of right hip, not elsewhere classified  Abnormal posture  Difficulty in walking, not elsewhere classified  RATIONALE FOR EVALUATION AND TREATMENT: Rehabilitation  ONSET DATE: years  NEXT MD VISIT: unknown   SUBJECTIVE:  SUBJECTIVE STATEMENT: Patient reports doing ok.  She reports pain today is 8/10.    Patient is a 53 y/o referred to PT for chronic R hip pain, chronic R SI jt pain, and fibromyalgia.  She states her pain has been going on for a long time now and is just getting worse.  She has seen us  in the past but has not been doing any of her home exercises from previously.  She reports constant pain in her low back with radiation into her R hip and posterolateral thigh that varies in intensity.  She works at [employer] and has a physical job requiring lifting, bending, twisting.  She reports pain gets worse as the day goes on but that she also has night pain with difficulty sleeping over 4 hrs at a times due to the pain.  She is taking Lyrica  but doesn't feel it is giving her much relief.  She has had injections in the back, SI, and R  posterior trochanter per her report but states they didn't help much either.  Last MD note states not planning for any further injections due to no relief of symptoms.   PAIN: Are you having pain? No and Yes: NPRS scale: 8/10 now; 4/10 best; 10/10 worst Pain location: R low back, R hip, R SI, R posterolateral thigh to mid thigh level Pain description: aching, stabbing Aggravating factors: Prolonged standing, bending, lifting Relieving factors: resting, meds  PERTINENT HISTORY:  Diabetes, HTN, vertigo, postural dizziness/presyncope  PRECAUTIONS: None  RED FLAGS: None  WEIGHT BEARING RESTRICTIONS: No  FALLS:  Has patient fallen in last 6 months? Yes. Number of falls twice this year;  tripped over dog once, other time possibly in the kitchen at school.    LIVING ENVIRONMENT: Lives with: lives with their family Lives in: House/apartment Stairs: No Has following equipment at home: None  OCCUPATION: Personnel officer at [employer];  [employer]; works in [employer], [employer] doing heavy lifting, standing  PLOF: Independent with gait  PATIENT GOALS: not hurt so much   OBJECTIVE: (objective measures completed at initial evaluation unless otherwise dated)  DIAGNOSTIC FINDINGS:  . Subtle anterolisthesis at L4-L5 with at least moderate facet arthropathy. No spinal stenosis but mild lateral recess and left foraminal stenosis at that level. 2. L5-S1 disc degeneration with a small rightward disc herniation most affecting the right lateral recess. Query right S1 radiculitis. 3. Partially visible lower thoracic disc degeneration with up to mild lower thoracic spinal stenosis.   PATIENT SURVEYS:  ODI = 26 / 50 or 52 %  SCREENING FOR RED FLAGS: Bowel or bladder incontinence: No Spinal tumors: No Cauda equina syndrome: No Compression fracture: No Abdominal aneurysm: No  COGNITION:  Overall cognitive status: Within functional limits for tasks assessed    SENSATION: WFL  POSTURE:   Valgus knees, leans to the R, some R lateral shift, hyperlordotic  PALPATION: TTP over central lumbar spine, R lumbar spine, B PSIS, R hip, R greater trochanter posteriorly, R IT band  LUMBAR ROM:   Active  Eval  Flexion To knees, increased p!  Extension 50%; more p!  Right lateral flexion To mid thigh; p!  Left lateral flexion To mid thigh; p!  Right rotation 60%; p!  Left rotation 40%; mild p!  (Blank rows = not tested)  MUSCLE LENGTH: Hamstrings: Right SLR 50 deg; Left SLR 65 deg Thomas test: Right 0 deg; Left 0 deg Hamstrings: severe tightness BLE ITB: min tightness BLE Piriformis: mod tightness BLE Hip  flexors:  Quads:  Heelcord:   LOWER EXTREMITY ROM:     Active  Right eval Left eval  Hip flexion    Hip extension    Hip abduction    Hip adduction    Hip internal rotation 30 40  Hip external rotation 50 50  Knee flexion    Knee extension    Ankle dorsiflexion    Ankle plantarflexion    Ankle inversion    Ankle eversion    (Blank rows = not tested)  LOWER EXTREMITY MMT:    MMT Right eval Left eval  Hip flexion 4- 4  Hip extension    Hip abduction 4- 4-  Hip adduction    Hip internal rotation 4- 4  Hip external rotation 4 4  Knee flexion 5 5  Knee extension 5 5  Ankle dorsiflexion 4 4  Ankle plantarflexion    Ankle inversion    Ankle eversion     (Blank rows = not tested)  LUMBAR SPECIAL TESTS:  Straight leg raise test: Positive, Slump test: Negative, FABER test: Negative, and Gaenslen's test: Negative  FUNCTIONAL TESTS:  TBD  GAIT: Distance walked: 200  feet into clinic Assistive device utilized: None Level of assistance: Complete Independence Gait pattern: decreased step length- Left, decreased stance time- Right, and lateral lean- Right Comments: leans to the R, limps mildly on RLE; very hyperlordotic with poor core and hip control   TODAY'S TREATMENT:  02/25/24 THERAPEUTIC EXERCISE: To improve strength and ROM.  Demonstration, verbal  and tactile cues throughout for technique. NuStep L3 x 6'  THERAPEUTIC ACTIVITIES: To improve functional performance.  Demonstration, verbal and tactile cues throughout for technique. Supine peanut ball rollouts x 20 BLE Supine peanut ball bridge x 20 BLE Supine small swiss ball S/S x 20 BLE Supine cross over piriformis stretch x 1' x 2 BLE Supine Strap assist SLR nerve glide/foot pumps x 20 BLE  NEUROMUSCULAR RE-EDUCATION: To improve kinesthesia. And spine/pelvic stabilization Supine ball & belt isometrics x 20 each way with cueing for co contraction rather than doing the contraction separately, has difficulty Transverse abs isometrics x 10 w/ manual facilitation and teaching for palpating umbilicus and feeling the slight tension SL clams/TA x 20 BLE Bridge/TA x 20 BLE Hooklying curl up x 20   02/23/24 SELF CARE: Provided education on PT POC progression, on pain management options, to promote safe home environment, and lifting safety; initial hep.   PATIENT EDUCATION:  Education details: HEP review and HEP update  Person educated: Patient Education method: Explanation, Demonstration, Verbal cues, Tactile cues, and Handouts Education comprehension: verbalized understanding, verbal cues required, tactile cues required, and needs further education  HOME EXERCISE PROGRAM: Access Code: [email or web address] URL: [email or web address] Date: 02/23/2024 Prepared by: Garnette Montclair  Exercises - Straight Leg Raise Sciatic Nerve Flossing  - 1 x daily - 7 x weekly - 1 sets - 2 reps - 1 min hold - Supine Piriformis Stretch  - 1 x daily - 7 x weekly - 1 sets - 2 reps - 1 min hold - Supine Lower Trunk Rotation  - 1 x daily - 7 x weekly - 3 sets - 10 reps   ASSESSMENT:  CLINICAL IMPRESSION: Able to progress exercise to strengthening for low back and core.  Patient tolerates well.  Piriformis stretching is very stiff BLE.  She is still having the LBP and leg pain.  PT remains necessary  for this problem as well as stiffness, weakness in her back and legs.  Continue  per POC  EVAL ;Lauren Soto is a 53 y.o. female who was referred to physical therapy for evaluation and treatment for R hip pain, R SI pain, and fibromyalgia   Patient reports onset of back and RLE pain beginning years ago. Pain is worse with prolonged standing, walking, bending, lifting.   She also has pain at night with disturbed sleep.  It is difficult to differentiate the sources of her pain since they are widespread over her back, hip, SI, trochanteric, IT, and over the buttock in general.    Patient has deficits in lumbar ROM,  B LE flexibility, BLE strength, abnormal posture, and TTP with abnormal muscle tension in the R hip which are interfering with ADLs and are impacting quality of life.  On Modified Oswestry patient scored 26 / 50 or 52 % disability.  Rise will benefit from skilled PT to address above deficits to improve mobility and activity tolerance with decreased pain interference.     OBJECTIVE IMPAIRMENTS: difficulty walking, decreased ROM, decreased strength, impaired flexibility, postural dysfunction, and pain.   ACTIVITY LIMITATIONS: carrying, lifting, bending, standing, sleeping, and locomotion level  PARTICIPATION LIMITATIONS: meal prep, cleaning, laundry, community activity, and occupation  PERSONAL FACTORS: Age, Fitness, Time since onset of injury/illness/exacerbation, and 1-2 comorbidities: Diabetes, HTN, vertigo, postural dizziness/presyncope are also affecting patient's functional outcome.   REHAB POTENTIAL: Good  CLINICAL DECISION MAKING: Evolving/moderate complexity  EVALUATION COMPLEXITY: Moderate   GOALS: Goals reviewed with patient? Yes  SHORT TERM GOALS: Target date: 03/23/2024   Patient will be independent with initial HEP to improve outcomes and carryover.  Baseline: 100% PT assist required for correct completion 8/27:  Patient requires assist for correct completion  of HEP, but more independent Goal status: IN PROGRESS  2.  Patient will report 25% improvement in low back pain to improve QOL. Baseline: 10/10 worst 8/27:  8/10 Goal status: IN PROGRESS LONG TERM GOALS: Target date: 04/20/2024   Patient will be independent with ongoing/advanced HEP for self-management at home.  Baseline: no advanced HEP yet 8/27:  advanced today Goal status: IN PROGRESS  2.  Patient will report 50-75% improvement in low back pain to improve QOL.  Baseline: 10/10 worst Goal status: INITIAL  3.  Patient to demonstrate ability to achieve and maintain good spinal alignment/posturing and body mechanics needed for daily activities. Baseline: hyperlordotic with poor abdominal control Goal status: INITIAL  4.  Patient will demonstrate full pain free lumbar ROM to perform ADLs.   Baseline: Refer to above lumbar ROM table Goal status: INITIAL  5.  Patient will demonstrate improved BLE strength to >/= 4 to 4+/5 for improved stability and ease of mobility. Baseline: Refer to above LE MMT table Goal status: INITIAL  6. Patient will report </= 35% on Modified Oswestry (MCID = 12%) to demonstrate improved functional ability with decreased pain interference. Baseline: 26 / 50 or 52 % Goal status: INITIAL  7.  Patient will tolerate 6 hrs of (standing/sitting/walking) w/o increased pain to allow for  improved mobility and activity tolerance to be able to work her job full duty. Baseline: works but is in pain all day Goal status: INITIAL  8.  Patient will report centralization of radicular symptoms.  Baseline: reports pain into the R hip/thigh Goal status: INITIAL PLAN:  PT FREQUENCY: 1-2x/week  PT DURATION: 8 weeks  PLANNED INTERVENTIONS: 97110-Therapeutic exercises, 97530- Therapeutic activity, [id number]- Neuromuscular re-education, 97535- Self Care, [id number]- Manual therapy, [id number]- Electrical stimulation (unattended), 97016- Vasopneumatic device, [id number]- Ultrasound, [id number]-  Traction (mechanical), 02966- Ionotophoresis 4mg /ml Dexamethasone, 20560 (1-2 muscles), 20561 (3+ muscles)- Dry Needling, Patient/Family education, Balance training, Stair training, Taping, Joint mobilization, Spinal mobilization, Cryotherapy, and Moist heat  PLAN FOR NEXT SESSION: see how additional HEP exercises did.   Try R pelvic sideglides, revisit lumbar extension with R bias; Patient has done planks in past on knees.  May revisit this exercise  Yvaine Jankowiak, PT 02/25/2024, 5:15 PM

## 2024-03-08 ENCOUNTER — Ambulatory Visit

## 2024-03-10 ENCOUNTER — Encounter

## 2024-03-15 ENCOUNTER — Encounter: Admitting: Rehabilitation

## 2024-03-17 ENCOUNTER — Ambulatory Visit: Attending: Physical Medicine and Rehabilitation | Admitting: Rehabilitation

## 2024-03-17 DIAGNOSIS — M5441 Lumbago with sciatica, right side: Secondary | ICD-10-CM | POA: Insufficient documentation

## 2024-03-17 DIAGNOSIS — R262 Difficulty in walking, not elsewhere classified: Secondary | ICD-10-CM | POA: Insufficient documentation

## 2024-03-17 DIAGNOSIS — M25651 Stiffness of right hip, not elsewhere classified: Secondary | ICD-10-CM | POA: Insufficient documentation

## 2024-03-17 DIAGNOSIS — G8929 Other chronic pain: Secondary | ICD-10-CM | POA: Insufficient documentation

## 2024-03-17 DIAGNOSIS — R293 Abnormal posture: Secondary | ICD-10-CM | POA: Insufficient documentation

## 2024-03-17 DIAGNOSIS — M6281 Muscle weakness (generalized): Secondary | ICD-10-CM | POA: Insufficient documentation

## 2024-03-18 ENCOUNTER — Ambulatory Visit

## 2024-03-18 DIAGNOSIS — M6281 Muscle weakness (generalized): Secondary | ICD-10-CM

## 2024-03-18 DIAGNOSIS — R262 Difficulty in walking, not elsewhere classified: Secondary | ICD-10-CM | POA: Diagnosis present

## 2024-03-18 DIAGNOSIS — M5441 Lumbago with sciatica, right side: Secondary | ICD-10-CM | POA: Diagnosis present

## 2024-03-18 DIAGNOSIS — M25651 Stiffness of right hip, not elsewhere classified: Secondary | ICD-10-CM

## 2024-03-18 DIAGNOSIS — R293 Abnormal posture: Secondary | ICD-10-CM

## 2024-03-18 DIAGNOSIS — G8929 Other chronic pain: Secondary | ICD-10-CM | POA: Diagnosis present

## 2024-03-18 NOTE — Therapy (Signed)
 OUTPATIENT PHYSICAL THERAPY THORACOLUMBAR EVALUATION   Patient Name: Lauren Soto MRN: [id number] DOB:Jun 15, 1971, 53 y.o., female Today's Date: 03/18/2024  END OF SESSION:  PT End of Session - 03/18/24 1617     Visit Number 3    Date for Recertification  04/19/24    Authorization Type [employer] [facility] Medicaid    PT Start Time 1537    PT Stop Time 1616    PT Time Calculation (min) 39 min    Activity Tolerance Patient tolerated treatment well    Behavior During Therapy [facility] for tasks assessed/performed           Past Medical History:  Diagnosis Date   Acute cystitis 02/03/2023   AKI (acute kidney injury) (HCC) 02/02/2023   Asthma 04/21/2014   Chronic right hip pain 11/06/2022   Diabetes mellitus without complication (HCC)    Essential hypertension 05/25/2012   GERD (gastroesophageal reflux disease) 02/03/2023   Hyperlipidemia 11/06/2022   Hypertension    Long term current use of oral hypoglycemic drug 11/06/2022   Menorrhagia 06/08/2012   Non-insulin  dependent type 2 diabetes mellitus (HCC) 06/18/2018   Pelvic pain in female 06/08/2012   Postural dizziness with presyncope 02/03/2023   Severe sepsis (HCC) 02/03/2023   Uterine fibroid 05/25/2012   Vertigo    Vestibular migraine 07/06/2018   Past Surgical History:  Procedure Laterality Date   ABDOMINAL HYSTERECTOMY     BUNIONECTOMY     CHOLECYSTECTOMY     HERNIA REPAIR     umblical   SHOULDER ARTHROSCOPY     Patient Active Problem List   Diagnosis Date Noted   Dysuria 01/19/2024   Polyarthralgia 11/27/2023   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   Fibromyalgia 09/10/2023   Other insomnia 09/10/2023   Obesity (BMI 35.0-39.9 without comorbidity) 03/19/2023   Vertigo    Severe sepsis (HCC) 02/03/2023   Postural dizziness with presyncope 02/03/2023   GERD (gastroesophageal reflux disease) 02/03/2023   Acute cystitis 02/03/2023   AKI (acute kidney injury) (HCC) 02/02/2023   Long term current use  of oral hypoglycemic drug 11/06/2022   Chronic right hip pain 11/06/2022   Hyperlipidemia 11/06/2022   Vestibular migraine 07/06/2018   Non-insulin  dependent type 2 diabetes mellitus (HCC) 06/18/2018   Asthma 04/21/2014   Menorrhagia 06/08/2012   Pelvic pain in female 06/08/2012   Essential hypertension 05/25/2012   Uterine fibroid 05/25/2012    PCP: [name], NP   REFERRING PROVIDER: Emeline Joesph BROCKS, DO   REFERRING DIAG: [phone] (ICD-10-CM) - Chronic right hip pain M53.3,G89.29 (ICD-10-CM) - Chronic right SI joint pain M79.7 (ICD-10-CM) - Fibromyalgia  THERAPY DIAG:  Chronic right-sided low back pain with right-sided sciatica  Muscle weakness (generalized)  Stiffness of right hip, not elsewhere classified  Abnormal posture  Difficulty in walking, not elsewhere classified  RATIONALE FOR EVALUATION AND TREATMENT: Rehabilitation  ONSET DATE: years  NEXT MD VISIT: unknown   SUBJECTIVE:  SUBJECTIVE STATEMENT: Patient reports doing ok.  She reports pain today is 8/10.    Patient is a 53 y/o referred to PT for chronic R hip pain, chronic R SI jt pain, and fibromyalgia.  She states her pain has been going on for a long time now and is just getting worse.  She has seen us  in the past but has not been doing any of her home exercises from previously.  She reports constant pain in her low back with radiation into her R hip and posterolateral thigh that varies in intensity.  She works at [employer] and has a physical job requiring lifting, bending, twisting.  She reports pain gets worse as the day goes on but that she also has night pain with difficulty sleeping over 4 hrs at a times due to the pain.  She is taking Lyrica  but doesn't feel it is giving her much  relief.  She has had injections in the back, SI, and R posterior trochanter per her report but states they didn't help much either.  Last MD note states not planning for any further injections due to no relief of symptoms.   PAIN: Are you having pain? No and Yes: NPRS scale: 8/10 now; 4/10 best; 10/10 worst Pain location: R low back, R hip, R SI, R posterolateral thigh to mid thigh level Pain description: aching, stabbing Aggravating factors: Prolonged standing, bending, lifting Relieving factors: resting, meds  PERTINENT HISTORY:  Diabetes, HTN, vertigo, postural dizziness/presyncope  PRECAUTIONS: None  RED FLAGS: None  WEIGHT BEARING RESTRICTIONS: No  FALLS:  Has patient fallen in last 6 months? Yes. Number of falls twice this year;  tripped over dog once, other time possibly in the kitchen at school.    LIVING ENVIRONMENT: Lives with: lives with their family Lives in: House/apartment Stairs: No Has following equipment at home: None  OCCUPATION: Personnel officer at [employer];  [employer]; works in [employer], mining doing heavy lifting, standing  PLOF: Independent with gait  PATIENT GOALS: not hurt so much   OBJECTIVE: (objective measures completed at initial evaluation unless otherwise dated)  DIAGNOSTIC FINDINGS:  . Subtle anterolisthesis at L4-L5 with at least moderate facet arthropathy. No spinal stenosis but mild lateral recess and left foraminal stenosis at that level. 2. L5-S1 disc degeneration with a small rightward disc herniation most affecting the right lateral recess. Query right S1 radiculitis. 3. Partially visible lower thoracic disc degeneration with up to mild lower thoracic spinal stenosis.   PATIENT SURVEYS:  ODI = 26 / 50 or 52 %  SCREENING FOR RED FLAGS: Bowel or bladder incontinence: No Spinal tumors: No Cauda equina syndrome: No Compression fracture: No Abdominal aneurysm: No  COGNITION:  Overall cognitive status: Within functional  limits for tasks assessed    SENSATION: WFL  POSTURE:  Valgus knees, leans to the R, some R lateral shift, hyperlordotic  PALPATION: TTP over central lumbar spine, R lumbar spine, B PSIS, R hip, R greater trochanter posteriorly, R IT band  LUMBAR ROM:   Active  Eval  Flexion To knees, increased p!  Extension 50%; more p!  Right lateral flexion To mid thigh; p!  Left lateral flexion To mid thigh; p!  Right rotation 60%; p!  Left rotation 40%; mild p!  (Blank rows = not tested)  MUSCLE LENGTH: Hamstrings: Right SLR 50 deg; Left SLR 65 deg Thomas test: Right 0 deg; Left 0 deg Hamstrings: severe tightness BLE ITB: min tightness BLE Piriformis: mod tightness BLE Hip  flexors:  Quads:  Heelcord:   LOWER EXTREMITY ROM:     Active  Right eval Left eval  Hip flexion    Hip extension    Hip abduction    Hip adduction    Hip internal rotation 30 40  Hip external rotation 50 50  Knee flexion    Knee extension    Ankle dorsiflexion    Ankle plantarflexion    Ankle inversion    Ankle eversion    (Blank rows = not tested)  LOWER EXTREMITY MMT:    MMT Right eval Left eval  Hip flexion 4- 4  Hip extension    Hip abduction 4- 4-  Hip adduction    Hip internal rotation 4- 4  Hip external rotation 4 4  Knee flexion 5 5  Knee extension 5 5  Ankle dorsiflexion 4 4  Ankle plantarflexion    Ankle inversion    Ankle eversion     (Blank rows = not tested)  LUMBAR SPECIAL TESTS:  Straight leg raise test: Positive, Slump test: Negative, FABER test: Negative, and Gaenslen's test: Negative  FUNCTIONAL TESTS:  TBD  GAIT: Distance walked: 200  feet into clinic Assistive device utilized: None Level of assistance: Complete Independence Gait pattern: decreased step length- Left, decreased stance time- Right, and lateral lean- Right Comments: leans to the R, limps mildly on RLE; very hyperlordotic with poor core and hip control   TODAY'S TREATMENT:  03/18/24 Bike  L2x39min LTR both ways x 15 each LE Supine RLE piriformis stretch x 1 min Bridges with TrA x 25  S/L clamshells YTB x 20 Supine hamstring stretch with sciatic nerve mobilization x 10  Seated mod piriformis stretch x 1 min  02/25/24 THERAPEUTIC EXERCISE: To improve strength and ROM.  Demonstration, verbal and tactile cues throughout for technique. NuStep L3 x 6'  THERAPEUTIC ACTIVITIES: To improve functional performance.  Demonstration, verbal and tactile cues throughout for technique. Supine peanut ball rollouts x 20 BLE Supine peanut ball bridge x 20 BLE Supine small swiss ball S/S x 20 BLE Supine cross over piriformis stretch x 1' x 2 BLE Supine Strap assist SLR nerve glide/foot pumps x 20 BLE  NEUROMUSCULAR RE-EDUCATION: To improve kinesthesia. And spine/pelvic stabilization Supine ball & belt isometrics x 20 each way with cueing for co contraction rather than doing the contraction separately, has difficulty Transverse abs isometrics x 10 w/ manual facilitation and teaching for palpating umbilicus and feeling the slight tension SL clams/TA x 20 BLE Bridge/TA x 20 BLE Hooklying curl up x 20   02/23/24 SELF CARE: Provided education on PT POC progression, on pain management options, to promote safe home environment, and lifting safety; initial hep.   PATIENT EDUCATION:  Education details: HEP review and HEP update  Person educated: Patient Education method: Explanation, Demonstration, Verbal cues, Tactile cues, and Handouts Education comprehension: verbalized understanding, verbal cues required, tactile cues required, and needs further education  HOME EXERCISE PROGRAM: Access Code: [email or web address] URL: [email or web address] Date: 02/23/2024 Prepared by: Garnette Montclair  Exercises - Straight Leg Raise Sciatic Nerve Flossing  - 1 x daily - 7 x weekly - 1 sets - 2 reps - 1 min hold - Supine Piriformis Stretch  - 1 x daily - 7 x weekly - 1 sets - 2 reps - 1 min hold -  Supine Lower Trunk Rotation  - 1 x daily - 7 x weekly - 3 sets - 10 reps   ASSESSMENT:  CLINICAL IMPRESSION: Able to progress exercise to  strengthening for low back and core.  Patient tolerates well. She is still having the LBP and leg pain.  PT remains necessary for this problem as well as stiffness, weakness in her back and legs.  Continue per POC  EVAL ;Lauren Soto is a 53 y.o. female who was referred to physical therapy for evaluation and treatment for R hip pain, R SI pain, and fibromyalgia   Patient reports onset of back and RLE pain beginning years ago. Pain is worse with prolonged standing, walking, bending, lifting.   She also has pain at night with disturbed sleep.  It is difficult to differentiate the sources of her pain since they are widespread over her back, hip, SI, trochanteric, IT, and over the buttock in general.    Patient has deficits in lumbar ROM,  B LE flexibility, BLE strength, abnormal posture, and TTP with abnormal muscle tension in the R hip which are interfering with ADLs and are impacting quality of life.  On Modified Oswestry patient scored 26 / 50 or 52 % disability.  Lauren Soto will benefit from skilled PT to address above deficits to improve mobility and activity tolerance with decreased pain interference.     OBJECTIVE IMPAIRMENTS: difficulty walking, decreased ROM, decreased strength, impaired flexibility, postural dysfunction, and pain.   ACTIVITY LIMITATIONS: carrying, lifting, bending, standing, sleeping, and locomotion level  PARTICIPATION LIMITATIONS: meal prep, cleaning, laundry, community activity, and occupation  PERSONAL FACTORS: Age, Fitness, Time since onset of injury/illness/exacerbation, and 1-2 comorbidities: Diabetes, HTN, vertigo, postural dizziness/presyncope are also affecting patient's functional outcome.   REHAB POTENTIAL: Good  CLINICAL DECISION MAKING: Evolving/moderate complexity  EVALUATION COMPLEXITY: Moderate   GOALS: Goals  reviewed with patient? Yes  SHORT TERM GOALS: Target date: 03/23/2024   Patient will be independent with initial HEP to improve outcomes and carryover.  Baseline: 100% PT assist required for correct completion 8/27:  Patient requires assist for correct completion of HEP, but more independent Goal status: IN PROGRESS- 03/18/24 has not had chance to do   2.  Patient will report 25% improvement in low back pain to improve QOL. Baseline: 10/10 worst 8/27:  8/10 Goal status: IN PROGRESS- 03/18/24- no change thus far LONG TERM GOALS: Target date: 04/20/2024   Patient will be independent with ongoing/advanced HEP for self-management at home.  Baseline: no advanced HEP yet 8/27:  advanced today Goal status: IN PROGRESS  2.  Patient will report 50-75% improvement in low back pain to improve QOL.  Baseline: 10/10 worst Goal status: INITIAL  3.  Patient to demonstrate ability to achieve and maintain good spinal alignment/posturing and body mechanics needed for daily activities. Baseline: hyperlordotic with poor abdominal control Goal status: INITIAL  4.  Patient will demonstrate full pain free lumbar ROM to perform ADLs.   Baseline: Refer to above lumbar ROM table Goal status: INITIAL  5.  Patient will demonstrate improved BLE strength to >/= 4 to 4+/5 for improved stability and ease of mobility. Baseline: Refer to above LE MMT table Goal status: INITIAL  6. Patient will report </= 35% on Modified Oswestry (MCID = 12%) to demonstrate improved functional ability with decreased pain interference. Baseline: 26 / 50 or 52 % Goal status: INITIAL  7.  Patient will tolerate 6 hrs of (standing/sitting/walking) w/o increased pain to allow for  improved mobility and activity tolerance to be able to work her job full duty. Baseline: works but is in pain all day Goal status: INITIAL  8.  Patient will report centralization of  radicular symptoms.  Baseline: reports pain into the R hip/thigh Goal  status: INITIAL PLAN:  PT FREQUENCY: 1-2x/week  PT DURATION: 8 weeks  PLANNED INTERVENTIONS: 97110-Therapeutic exercises, 97530- Therapeutic activity, [id number]- Neuromuscular re-education, 97535- Self Care, 02859- Manual therapy, G0283- Electrical stimulation (unattended), 97016- Vasopneumatic device, [id number]- Ultrasound, [id number]- Traction (mechanical), [id number]- Ionotophoresis 4mg /ml Dexamethasone, 79439 (1-2 muscles), 20561 (3+ muscles)- Dry Needling, Patient/Family education, Balance training, Stair training, Taping, Joint mobilization, Spinal mobilization, Cryotherapy, and Moist heat  PLAN FOR NEXT SESSION: see how additional HEP exercises did.   Try R pelvic sideglides, revisit lumbar extension with R bias; Patient has done planks in past on knees.  May revisit this exercise  Sol LITTIE Gaskins, PTA 03/18/2024, 5:25 PM

## 2024-03-19 ENCOUNTER — Ambulatory Visit: Payer: Self-pay

## 2024-03-19 NOTE — Telephone Encounter (Signed)
 FYI Only or Action Required?: FYI only for provider.  Patient was last seen in primary care on 01/06/2024 by [name], NP.  Called Nurse Triage reporting Foot Swelling.  Symptoms began several weeks ago.  Interventions attempted: Nothing.  Symptoms are: stable.  Triage Disposition: See PCP When Office is Open (Within 3 Days)  Patient/caregiver understands and will follow disposition?: Yes Reason for Disposition  [1] MODERATE pain (e.g., interferes with normal activities, limping) AND [2] present > 3 days  Answer Assessment - Initial Assessment Questions States it is painful to walk when wearing shoes. Denies any recent injury.   1. ONSET: When did the pain start?      Painful for 3 weeks, swelling started last week  2. LOCATION: Where is the pain located?      Top of foot and ankle is painful   3. PAIN: How bad is the pain?    (Scale 1-10; or mild, moderate, severe)     Moderate, pain radiated up the leg one day last week  4. OTHER SYMPTOMS: Do you have any other symptoms? (e.g., leg pain, rash, fever, numbness)     Sometimes it feels a little numb, tingling in toes for about 3 weeks  Protocols used: Foot Pain-A-AH  Copied from CRM [id number]. Topic: Clinical - Red Word Triage >> Mar 19, 2024 11:39 AM Revonda D wrote: Red Word that prompted transfer to Nurse Triage: pain, swelling  Pt stated that the top of her left foot is painful and its been ongoing for about 3 weeks. Pt stated that the pain is going up to her leg and that she is also experiencing swelling. Pt would like to schedule an appt with provider.

## 2024-03-22 ENCOUNTER — Encounter: Admitting: Rehabilitation

## 2024-03-22 ENCOUNTER — Encounter: Payer: Self-pay | Admitting: Family Medicine

## 2024-03-22 ENCOUNTER — Ambulatory Visit: Admitting: Family Medicine

## 2024-03-22 VITALS — BP 103/42 | HR 100 | Ht 60.0 in | Wt 187.0 lb

## 2024-03-22 DIAGNOSIS — M7989 Other specified soft tissue disorders: Secondary | ICD-10-CM

## 2024-03-22 DIAGNOSIS — M25572 Pain in left ankle and joints of left foot: Secondary | ICD-10-CM

## 2024-03-22 DIAGNOSIS — M79605 Pain in left leg: Secondary | ICD-10-CM

## 2024-03-22 LAB — D-DIMER, QUANTITATIVE: D-Dimer, Quant: 0.25 ug{FEU}/mL (ref <0.50)

## 2024-03-22 NOTE — Progress Notes (Signed)
   Acute Office Visit  Subjective:     Patient ID: Lauren Soto, female    DOB: 19-Apr-1971, 53 y.o.   MRN: [id number]  Chief Complaint  Patient presents with   Leg Swelling    HPI Patient is in today for left leg pain/swelling.   Discussed the use of AI scribe software for clinical note transcription with the patient, who gave verbal consent to proceed.  History of Present Illness Lauren Soto is a 53 year old female who presents with left foot and leg pain and swelling.  Left foot pain began approximately three weeks ago, initially on the dorsal aspect of the foot. Over the next one to two weeks, swelling developed around the ankle, and the pain began radiating up the leg, affecting the calf , knee, and hip. The pain is described as shooting, stabbing, and achy, with a sensation of pressure, and it seems to travel upwards.  No recent injury, new shoes, or changes in activity. No similar symptoms in the right leg. A past episode involved significant calf swelling, but no definitive cause was identified despite extensive testing, including for blood clots.  Pain management includes resting and elevating the leg, which provides some relief.   No severe pain, redness, or streaking in the affected area.     ROS All review of systems negative except what is listed in the HPI      Objective:    BP (!) 103/42   Pulse 100   Ht 5' (1.524 m)   Wt 187 lb (84.8 kg)   LMP 06/04/2012   SpO2 97%   BMI 36.52 kg/m    Physical Exam Vitals reviewed.  Constitutional:      Appearance: Normal appearance.  Musculoskeletal:     Comments: Minimal inflammation to left lateral ankle and tenderness on palpation; no other edema noted; no skin changes  Skin:    General: Skin is warm and dry.  Neurological:     Mental Status: She is alert and oriented to person, place, and time.  Psychiatric:        Mood and Affect: Mood normal.        Behavior: Behavior normal.         Thought Content: Thought content normal.        Judgment: Judgment normal.         No results found for any visits on 03/22/24.      Assessment & Plan:   Problem List Items Addressed This Visit   None Visit Diagnoses       Left leg pain    -  Primary   Relevant Orders   D-Dimer, Quantitative     Left leg swelling       Relevant Orders   D-Dimer, Quantitative     Acute left ankle pain           Assessment & Plan Left ankle and foot pain with swelling, likely musculoskeletal - Recommend RICE for affected area. - Advise ankle brace or sleeve for support. - Suggest Voltaren gel to ankle for anti-inflammatory effect. - Encourage range of motion exercises. - Order D-dimer test to rule out DVT, given pain with Homans' sign - Instruct to report new symptoms like redness or severe calf pain.      No orders of the defined types were placed in this encounter.   Return if symptoms worsen or fail to improve.  Waddell KATHEE Mon, NP

## 2024-03-23 ENCOUNTER — Ambulatory Visit

## 2024-03-23 ENCOUNTER — Ambulatory Visit: Payer: Self-pay | Admitting: Family Medicine

## 2024-03-23 DIAGNOSIS — M6281 Muscle weakness (generalized): Secondary | ICD-10-CM

## 2024-03-23 DIAGNOSIS — G8929 Other chronic pain: Secondary | ICD-10-CM

## 2024-03-23 DIAGNOSIS — M5441 Lumbago with sciatica, right side: Secondary | ICD-10-CM | POA: Diagnosis not present

## 2024-03-23 DIAGNOSIS — M25651 Stiffness of right hip, not elsewhere classified: Secondary | ICD-10-CM

## 2024-03-23 NOTE — Therapy (Signed)
 OUTPATIENT PHYSICAL THERAPY THORACOLUMBAR TREATMENT   Patient Name: Lauren Soto MRN: [id number] DOB:1971/05/22, 53 y.o., female Today's Date: 03/23/2024  END OF SESSION:  PT End of Session - 03/23/24 1639     Visit Number 4    Date for Recertification  04/19/24    Authorization Type [employer] & [facility] Medicaid    PT Start Time 1534    PT Stop Time 1616    PT Time Calculation (min) 42 min    Activity Tolerance Patient tolerated treatment well    Behavior During Therapy [facility] for tasks assessed/performed            Past Medical History:  Diagnosis Date   Acute cystitis 02/03/2023   AKI (acute kidney injury) 02/02/2023   Asthma 04/21/2014   Chronic right hip pain 11/06/2022   Diabetes mellitus without complication (HCC)    Essential hypertension 05/25/2012   GERD (gastroesophageal reflux disease) 02/03/2023   Hyperlipidemia 11/06/2022   Hypertension    Long term current use of oral hypoglycemic drug 11/06/2022   Menorrhagia 06/08/2012   Non-insulin  dependent type 2 diabetes mellitus (HCC) 06/18/2018   Pelvic pain in female 06/08/2012   Postural dizziness with presyncope 02/03/2023   Severe sepsis (HCC) 02/03/2023   Uterine fibroid 05/25/2012   Vertigo    Vestibular migraine 07/06/2018   Past Surgical History:  Procedure Laterality Date   ABDOMINAL HYSTERECTOMY     BUNIONECTOMY     CHOLECYSTECTOMY     HERNIA REPAIR     umblical   SHOULDER ARTHROSCOPY     Patient Active Problem List   Diagnosis Date Noted   Dysuria 01/19/2024   Polyarthralgia 11/27/2023   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   Fibromyalgia 09/10/2023   Other insomnia 09/10/2023   Obesity (BMI 35.0-39.9 without comorbidity) 03/19/2023   Vertigo    Severe sepsis (HCC) 02/03/2023   Postural dizziness with presyncope 02/03/2023   GERD (gastroesophageal reflux disease) 02/03/2023   Acute cystitis 02/03/2023   AKI (acute kidney injury) 02/02/2023   Long term current use of oral  hypoglycemic drug 11/06/2022   Chronic right hip pain 11/06/2022   Hyperlipidemia 11/06/2022   Vestibular migraine 07/06/2018   Non-insulin  dependent type 2 diabetes mellitus (HCC) 06/18/2018   Asthma 04/21/2014   Menorrhagia 06/08/2012   Pelvic pain in female 06/08/2012   Essential hypertension 05/25/2012   Uterine fibroid 05/25/2012    PCP: [name], NP   REFERRING PROVIDER: Emeline Joesph BROCKS, DO   REFERRING DIAG: [phone] (ICD-10-CM) - Chronic right hip pain M53.3,G89.29 (ICD-10-CM) - Chronic right SI joint pain M79.7 (ICD-10-CM) - Fibromyalgia  THERAPY DIAG:  Chronic right-sided low back pain with right-sided sciatica  Muscle weakness (generalized)  Stiffness of right hip, not elsewhere classified  RATIONALE FOR EVALUATION AND TREATMENT: Rehabilitation  ONSET DATE: years  NEXT MD VISIT: unknown   SUBJECTIVE:  SUBJECTIVE STATEMENT: Increased soreness after last visit, laid in bed all direction   Patient is a 53 y/o referred to PT for chronic R hip pain, chronic R SI jt pain, and fibromyalgia.  She states her pain has been going on for a long time now and is just getting worse.  She has seen us  in the past but has not been doing any of her home exercises from previously.  She reports constant pain in her low back with radiation into her R hip and posterolateral thigh that varies in intensity.  She works at [employer] and has a physical job requiring lifting, bending, twisting.  She reports pain gets worse as the day goes on but that she also has night pain with difficulty sleeping over 4 hrs at a times due to the pain.  She is taking Lyrica  but doesn't feel it is giving her much relief.  She has had injections in the back, SI, and R posterior trochanter per  her report but states they didn't help much either.  Last MD note states not planning for any further injections due to no relief of symptoms.   PAIN: Are you having pain? No and Yes: NPRS scale: 8/10 now; 4/10 best; 10/10 worst Pain location: R low back, R hip, R SI, R posterolateral thigh to mid thigh level Pain description: aching, stabbing Aggravating factors: Prolonged standing, bending, lifting Relieving factors: resting, meds  PERTINENT HISTORY:  Diabetes, HTN, vertigo, postural dizziness/presyncope  PRECAUTIONS: None  RED FLAGS: None  WEIGHT BEARING RESTRICTIONS: No  FALLS:  Has patient fallen in last 6 months? Yes. Number of falls twice this year;  tripped over dog once, other time possibly in the kitchen at school.    LIVING ENVIRONMENT: Lives with: lives with their family Lives in: House/apartment Stairs: No Has following equipment at home: None  OCCUPATION: Personnel officer at [employer];  [employer]; works in [employer], mining doing heavy lifting, standing  PLOF: Independent with gait  PATIENT GOALS: not hurt so much   OBJECTIVE: (objective measures completed at initial evaluation unless otherwise dated)  DIAGNOSTIC FINDINGS:  . Subtle anterolisthesis at L4-L5 with at least moderate facet arthropathy. No spinal stenosis but mild lateral recess and left foraminal stenosis at that level. 2. L5-S1 disc degeneration with a small rightward disc herniation most affecting the right lateral recess. Query right S1 radiculitis. 3. Partially visible lower thoracic disc degeneration with up to mild lower thoracic spinal stenosis.   PATIENT SURVEYS:  ODI = 26 / 50 or 52 %  SCREENING FOR RED FLAGS: Bowel or bladder incontinence: No Spinal tumors: No Cauda equina syndrome: No Compression fracture: No Abdominal aneurysm: No  COGNITION:  Overall cognitive status: Within functional limits for tasks assessed    SENSATION: WFL  POSTURE:  Valgus knees, leans to  the R, some R lateral shift, hyperlordotic  PALPATION: TTP over central lumbar spine, R lumbar spine, B PSIS, R hip, R greater trochanter posteriorly, R IT band  LUMBAR ROM:   Active  Eval  Flexion To knees, increased p!  Extension 50%; more p!  Right lateral flexion To mid thigh; p!  Left lateral flexion To mid thigh; p!  Right rotation 60%; p!  Left rotation 40%; mild p!  (Blank rows = not tested)  MUSCLE LENGTH: Hamstrings: Right SLR 50 deg; Left SLR 65 deg Thomas test: Right 0 deg; Left 0 deg Hamstrings: severe tightness BLE ITB: min tightness BLE Piriformis: mod tightness BLE Hip flexors:  Quads:  Heelcord:   LOWER EXTREMITY ROM:     Active  Right eval Left eval  Hip flexion    Hip extension    Hip abduction    Hip adduction    Hip internal rotation 30 40  Hip external rotation 50 50  Knee flexion    Knee extension    Ankle dorsiflexion    Ankle plantarflexion    Ankle inversion    Ankle eversion    (Blank rows = not tested)  LOWER EXTREMITY MMT:    MMT Right eval Left eval  Hip flexion 4- 4  Hip extension    Hip abduction 4- 4-  Hip adduction    Hip internal rotation 4- 4  Hip external rotation 4 4  Knee flexion 5 5  Knee extension 5 5  Ankle dorsiflexion 4 4  Ankle plantarflexion    Ankle inversion    Ankle eversion     (Blank rows = not tested)  LUMBAR SPECIAL TESTS:  Straight leg raise test: Positive, Slump test: Negative, FABER test: Negative, and Gaenslen's test: Negative  FUNCTIONAL TESTS:  TBD  GAIT: Distance walked: 200  feet into clinic Assistive device utilized: None Level of assistance: Complete Independence Gait pattern: decreased step length- Left, decreased stance time- Right, and lateral lean- Right Comments: leans to the R, limps mildly on RLE; very hyperlordotic with poor core and hip control   TODAY'S TREATMENT:  03/23/24 Bike L1x18min Hip mobs inferior glide  Long leg distraction RLE IASTM And TPR to R piriformis  and TFL Hip flexor manual stretch side-lying 2x30'  Seated hip flexor stretch reviewed  03/18/24 Bike L2x49min LTR both ways x 15 each LE Supine RLE piriformis stretch x 1 min Bridges with TrA x 25  S/L clamshells YTB x 20 Supine hamstring stretch with sciatic nerve mobilization x 10  Seated mod piriformis stretch x 1 min  02/25/24 THERAPEUTIC EXERCISE: To improve strength and ROM.  Demonstration, verbal and tactile cues throughout for technique. NuStep L3 x 6'  THERAPEUTIC ACTIVITIES: To improve functional performance.  Demonstration, verbal and tactile cues throughout for technique. Supine peanut ball rollouts x 20 BLE Supine peanut ball bridge x 20 BLE Supine small swiss ball S/S x 20 BLE Supine cross over piriformis stretch x 1' x 2 BLE Supine Strap assist SLR nerve glide/foot pumps x 20 BLE  NEUROMUSCULAR RE-EDUCATION: To improve kinesthesia. And spine/pelvic stabilization Supine ball & belt isometrics x 20 each way with cueing for co contraction rather than doing the contraction separately, has difficulty Transverse abs isometrics x 10 w/ manual facilitation and teaching for palpating umbilicus and feeling the slight tension SL clams/TA x 20 BLE Bridge/TA x 20 BLE Hooklying curl up x 20   02/23/24 SELF CARE: Provided education on PT POC progression, on pain management options, to promote safe home environment, and lifting safety; initial hep.   PATIENT EDUCATION:  Education details: HEP review and HEP update  Person educated: Patient Education method: Explanation, Demonstration, Verbal cues, Tactile cues, and Handouts Education comprehension: verbalized understanding, verbal cues required, tactile cues required, and needs further education  HOME EXERCISE PROGRAM: Access Code: [email or web address] URL: [email or web address] Date: 02/23/2024 Prepared by: Garnette Montclair  Exercises - Straight Leg Raise Sciatic Nerve Flossing  - 1 x daily - 7 x weekly - 1 sets - 2  reps - 1 min hold - Supine Piriformis Stretch  - 1 x daily - 7 x weekly - 1 sets - 2 reps - 1 min hold -  Supine Lower Trunk Rotation  - 1 x daily - 7 x weekly - 3 sets - 10 reps   ASSESSMENT:  CLINICAL IMPRESSION: Pt tolerated well today, addressed soft tissue restrictions in R piriformis and TFL followed by stretching to increased flexibility.  Pt with less pain reported after session today.  EVAL ;Lauren Soto is a 53 y.o. female who was referred to physical therapy for evaluation and treatment for R hip pain, R SI pain, and fibromyalgia   Patient reports onset of back and RLE pain beginning years ago. Pain is worse with prolonged standing, walking, bending, lifting.   She also has pain at night with disturbed sleep.  It is difficult to differentiate the sources of her pain since they are widespread over her back, hip, SI, trochanteric, IT, and over the buttock in general.    Patient has deficits in lumbar ROM,  B LE flexibility, BLE strength, abnormal posture, and TTP with abnormal muscle tension in the R hip which are interfering with ADLs and are impacting quality of life.  On Modified Oswestry patient scored 26 / 50 or 52 % disability.  Analaya will benefit from skilled PT to address above deficits to improve mobility and activity tolerance with decreased pain interference.     OBJECTIVE IMPAIRMENTS: difficulty walking, decreased ROM, decreased strength, impaired flexibility, postural dysfunction, and pain.   ACTIVITY LIMITATIONS: carrying, lifting, bending, standing, sleeping, and locomotion level  PARTICIPATION LIMITATIONS: meal prep, cleaning, laundry, community activity, and occupation  PERSONAL FACTORS: Age, Fitness, Time since onset of injury/illness/exacerbation, and 1-2 comorbidities: Diabetes, HTN, vertigo, postural dizziness/presyncope are also affecting patient's functional outcome.   REHAB POTENTIAL: Good  CLINICAL DECISION MAKING: Evolving/moderate  complexity  EVALUATION COMPLEXITY: Moderate   GOALS: Goals reviewed with patient? Yes  SHORT TERM GOALS: Target date: 03/23/2024   Patient will be independent with initial HEP to improve outcomes and carryover.  Baseline: 100% PT assist required for correct completion 8/27:  Patient requires assist for correct completion of HEP, but more independent Goal status: IN PROGRESS- 03/18/24 has not had chance to do   2.  Patient will report 25% improvement in low back pain to improve QOL. Baseline: 10/10 worst 8/27:  8/10 Goal status: IN PROGRESS- 03/18/24- no change thus far LONG TERM GOALS: Target date: 04/20/2024   Patient will be independent with ongoing/advanced HEP for self-management at home.  Baseline: no advanced HEP yet 8/27:  advanced today Goal status: IN PROGRESS  2.  Patient will report 50-75% improvement in low back pain to improve QOL.  Baseline: 10/10 worst Goal status: INITIAL  3.  Patient to demonstrate ability to achieve and maintain good spinal alignment/posturing and body mechanics needed for daily activities. Baseline: hyperlordotic with poor abdominal control Goal status: INITIAL  4.  Patient will demonstrate full pain free lumbar ROM to perform ADLs.   Baseline: Refer to above lumbar ROM table Goal status: INITIAL  5.  Patient will demonstrate improved BLE strength to >/= 4 to 4+/5 for improved stability and ease of mobility. Baseline: Refer to above LE MMT table Goal status: INITIAL  6. Patient will report </= 35% on Modified Oswestry (MCID = 12%) to demonstrate improved functional ability with decreased pain interference. Baseline: 26 / 50 or 52 % Goal status: INITIAL  7.  Patient will tolerate 6 hrs of (standing/sitting/walking) w/o increased pain to allow for  improved mobility and activity tolerance to be able to work her job full duty. Baseline: works but is in pain  all day Goal status: INITIAL  8.  Patient will report centralization of  radicular symptoms.  Baseline: reports pain into the R hip/thigh Goal status: INITIAL PLAN:  PT FREQUENCY: 1-2x/week  PT DURATION: 8 weeks  PLANNED INTERVENTIONS: 97110-Therapeutic exercises, 97530- Therapeutic activity, [id number]- Neuromuscular re-education, 97535- Self Care, [id number]- Manual therapy, [id number]- Electrical stimulation (unattended), [id number]- Vasopneumatic device, [id number]- Ultrasound, [id number]- Traction (mechanical), [id number]- Ionotophoresis 4mg /ml Dexamethasone, 79439 (1-2 muscles), 20561 (3+ muscles)- Dry Needling, Patient/Family education, Balance training, Stair training, Taping, Joint mobilization, Spinal mobilization, Cryotherapy, and Moist heat  PLAN FOR NEXT SESSION: Address soft tissue restrictions in piriformis and TFL; see how additional HEP exercises did.   Try R pelvic sideglides, revisit lumbar extension with R bias;   Sol LITTIE Gaskins, PTA 03/23/2024, 4:41 PM

## 2024-03-25 ENCOUNTER — Ambulatory Visit

## 2024-03-25 DIAGNOSIS — G8929 Other chronic pain: Secondary | ICD-10-CM

## 2024-03-25 DIAGNOSIS — R262 Difficulty in walking, not elsewhere classified: Secondary | ICD-10-CM

## 2024-03-25 DIAGNOSIS — M5441 Lumbago with sciatica, right side: Secondary | ICD-10-CM | POA: Diagnosis not present

## 2024-03-25 DIAGNOSIS — M6281 Muscle weakness (generalized): Secondary | ICD-10-CM

## 2024-03-25 DIAGNOSIS — R293 Abnormal posture: Secondary | ICD-10-CM

## 2024-03-25 DIAGNOSIS — M25651 Stiffness of right hip, not elsewhere classified: Secondary | ICD-10-CM

## 2024-03-25 NOTE — Therapy (Signed)
 OUTPATIENT PHYSICAL THERAPY THORACOLUMBAR TREATMENT   Patient Name: Lauren Soto MRN: [id number] DOB:03/28/1971, 53 y.o., female Today's Date: 03/25/2024  END OF SESSION:  PT End of Session - 03/25/24 1546     Visit Number 5    Date for Recertification  04/19/24    Authorization Type [employer] & [facility] Medicaid    PT Start Time 1540   pt late   PT Stop Time 1617    PT Time Calculation (min) 37 min    Activity Tolerance Patient tolerated treatment well    Behavior During Therapy [facility] for tasks assessed/performed             Past Medical History:  Diagnosis Date   Acute cystitis 02/03/2023   AKI (acute kidney injury) 02/02/2023   Asthma 04/21/2014   Chronic right hip pain 11/06/2022   Diabetes mellitus without complication (HCC)    Essential hypertension 05/25/2012   GERD (gastroesophageal reflux disease) 02/03/2023   Hyperlipidemia 11/06/2022   Hypertension    Long term current use of oral hypoglycemic drug 11/06/2022   Menorrhagia 06/08/2012   Non-insulin  dependent type 2 diabetes mellitus (HCC) 06/18/2018   Pelvic pain in female 06/08/2012   Postural dizziness with presyncope 02/03/2023   Severe sepsis (HCC) 02/03/2023   Uterine fibroid 05/25/2012   Vertigo    Vestibular migraine 07/06/2018   Past Surgical History:  Procedure Laterality Date   ABDOMINAL HYSTERECTOMY     BUNIONECTOMY     CHOLECYSTECTOMY     HERNIA REPAIR     umblical   SHOULDER ARTHROSCOPY     Patient Active Problem List   Diagnosis Date Noted   Dysuria 01/19/2024   Polyarthralgia 11/27/2023   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   Fibromyalgia 09/10/2023   Other insomnia 09/10/2023   Obesity (BMI 35.0-39.9 without comorbidity) 03/19/2023   Vertigo    Severe sepsis (HCC) 02/03/2023   Postural dizziness with presyncope 02/03/2023   GERD (gastroesophageal reflux disease) 02/03/2023   Acute cystitis 02/03/2023   AKI (acute kidney injury) 02/02/2023   Long term current use  of oral hypoglycemic drug 11/06/2022   Chronic right hip pain 11/06/2022   Hyperlipidemia 11/06/2022   Vestibular migraine 07/06/2018   Non-insulin  dependent type 2 diabetes mellitus (HCC) 06/18/2018   Asthma 04/21/2014   Menorrhagia 06/08/2012   Pelvic pain in female 06/08/2012   Essential hypertension 05/25/2012   Uterine fibroid 05/25/2012    PCP: [name], NP   REFERRING PROVIDER: Emeline Joesph BROCKS, DO   REFERRING DIAG: [phone] (ICD-10-CM) - Chronic right hip pain M53.3,G89.29 (ICD-10-CM) - Chronic right SI joint pain M79.7 (ICD-10-CM) - Fibromyalgia  THERAPY DIAG:  Chronic right-sided low back pain with right-sided sciatica  Muscle weakness (generalized)  Stiffness of right hip, not elsewhere classified  Abnormal posture  Difficulty in walking, not elsewhere classified  RATIONALE FOR EVALUATION AND TREATMENT: Rehabilitation  ONSET DATE: years  NEXT MD VISIT: unknown   SUBJECTIVE:  SUBJECTIVE STATEMENT: Feeling better today actually, feels like the manual from last session really helped.  Patient is a 53 y/o referred to PT for chronic R hip pain, chronic R SI jt pain, and fibromyalgia.  She states her pain has been going on for a long time now and is just getting worse.  She has seen us  in the past but has not been doing any of her home exercises from previously.  She reports constant pain in her low back with radiation into her R hip and posterolateral thigh that varies in intensity.  She works at [employer] and has a physical job requiring lifting, bending, twisting.  She reports pain gets worse as the day goes on but that she also has night pain with difficulty sleeping over 4 hrs at a times due to the pain.  She is taking Lyrica  but doesn't feel it  is giving her much relief.  She has had injections in the back, SI, and R posterior trochanter per her report but states they didn't help much either.  Last MD note states not planning for any further injections due to no relief of symptoms.   PAIN: Are you having pain? No and Yes: NPRS scale: 4/10 Pain location: R low back and glutes Pain description: aching, stabbing Aggravating factors: Prolonged standing, bending, lifting Relieving factors: resting, meds  PERTINENT HISTORY:  Diabetes, HTN, vertigo, postural dizziness/presyncope  PRECAUTIONS: None  RED FLAGS: None  WEIGHT BEARING RESTRICTIONS: No  FALLS:  Has patient fallen in last 6 months? Yes. Number of falls twice this year;  tripped over dog once, other time possibly in the kitchen at school.    LIVING ENVIRONMENT: Lives with: lives with their family Lives in: House/apartment Stairs: No Has following equipment at home: None  OCCUPATION: Personnel officer at [employer];  [employer]; works in [employer], mining doing heavy lifting, standing  PLOF: Independent with gait  PATIENT GOALS: not hurt so much   OBJECTIVE: (objective measures completed at initial evaluation unless otherwise dated)  DIAGNOSTIC FINDINGS:  . Subtle anterolisthesis at L4-L5 with at least moderate facet arthropathy. No spinal stenosis but mild lateral recess and left foraminal stenosis at that level. 2. L5-S1 disc degeneration with a small rightward disc herniation most affecting the right lateral recess. Query right S1 radiculitis. 3. Partially visible lower thoracic disc degeneration with up to mild lower thoracic spinal stenosis.   PATIENT SURVEYS:  ODI = 26 / 50 or 52 %  SCREENING FOR RED FLAGS: Bowel or bladder incontinence: No Spinal tumors: No Cauda equina syndrome: No Compression fracture: No Abdominal aneurysm: No  COGNITION:  Overall cognitive status: Within functional limits for tasks  assessed    SENSATION: WFL  POSTURE:  Valgus knees, leans to the R, some R lateral shift, hyperlordotic  PALPATION: TTP over central lumbar spine, R lumbar spine, B PSIS, R hip, R greater trochanter posteriorly, R IT band  LUMBAR ROM:   Active  Eval 03/25/24  Flexion To knees, increased p! To ankles   Extension 50%; more p! 25%  Right lateral flexion To mid thigh; p! To distal thigh  Left lateral flexion To mid thigh; p! To dista  Right rotation 60%; p!   Left rotation 40%; mild p!   (Blank rows = not tested)  MUSCLE LENGTH: Hamstrings: Right SLR 50 deg; Left SLR 65 deg Thomas test: Right 0 deg; Left 0 deg Hamstrings: severe tightness BLE ITB: min tightness BLE Piriformis: mod tightness BLE Hip flexors:  Quads:  Heelcord:   LOWER EXTREMITY ROM:     Active  Right eval Left eval  Hip flexion    Hip extension    Hip abduction    Hip adduction    Hip internal rotation 30 40  Hip external rotation 50 50  Knee flexion    Knee extension    Ankle dorsiflexion    Ankle plantarflexion    Ankle inversion    Ankle eversion    (Blank rows = not tested)  LOWER EXTREMITY MMT:    MMT Right eval Left eval  Hip flexion 4- 4  Hip extension    Hip abduction 4- 4-  Hip adduction    Hip internal rotation 4- 4  Hip external rotation 4 4  Knee flexion 5 5  Knee extension 5 5  Ankle dorsiflexion 4 4  Ankle plantarflexion    Ankle inversion    Ankle eversion     (Blank rows = not tested)  LUMBAR SPECIAL TESTS:  Straight leg raise test: Positive, Slump test: Negative, FABER test: Negative, and Gaenslen's test: Negative  FUNCTIONAL TESTS:  TBD  GAIT: Distance walked: 200  feet into clinic Assistive device utilized: None Level of assistance: Complete Independence Gait pattern: decreased step length- Left, decreased stance time- Right, and lateral lean- Right Comments: leans to the R, limps mildly on RLE; very hyperlordotic with poor core and hip control   TODAY'S  TREATMENT:  03/25/24 Nustep L5x5 min IASTM and TPR to R piriformis, TFL, ITB, VL Supine R ITB stretch with strap 2x30 Supine R quad stretch w/ strap 2x30 S/L R hip abduction x 10  03/23/24 Bike L1x50min Hip mobs inferior glide  Long leg distraction RLE IASTM And TPR to R piriformis and TFL Hip flexor manual stretch side-lying 2x30'  Seated hip flexor stretch reviewed  03/18/24 Bike L2x68min LTR both ways x 15 each LE Supine RLE piriformis stretch x 1 min Bridges with TrA x 25  S/L clamshells YTB x 20 Supine hamstring stretch with sciatic nerve mobilization x 10  Seated mod piriformis stretch x 1 min  02/25/24 THERAPEUTIC EXERCISE: To improve strength and ROM.  Demonstration, verbal and tactile cues throughout for technique. NuStep L3 x 6'  THERAPEUTIC ACTIVITIES: To improve functional performance.  Demonstration, verbal and tactile cues throughout for technique. Supine peanut ball rollouts x 20 BLE Supine peanut ball bridge x 20 BLE Supine small swiss ball S/S x 20 BLE Supine cross over piriformis stretch x 1' x 2 BLE Supine Strap assist SLR nerve glide/foot pumps x 20 BLE  NEUROMUSCULAR RE-EDUCATION: To improve kinesthesia. And spine/pelvic stabilization Supine ball & belt isometrics x 20 each way with cueing for co contraction rather than doing the contraction separately, has difficulty Transverse abs isometrics x 10 w/ manual facilitation and teaching for palpating umbilicus and feeling the slight tension SL clams/TA x 20 BLE Bridge/TA x 20 BLE Hooklying curl up x 20   02/23/24 SELF CARE: Provided education on PT POC progression, on pain management options, to promote safe home environment, and lifting safety; initial hep.   PATIENT EDUCATION:  Education details: HEP review and HEP update  Person educated: Patient Education method: Explanation, Demonstration, Verbal cues, Tactile cues, and Handouts Education comprehension: verbalized understanding, verbal cues  required, tactile cues required, and needs further education  HOME EXERCISE PROGRAM: Access Code: [email or web address] URL: [email or web address] Date: 03/25/2024 Prepared by: Patsy Zaragoza  Exercises - Straight Leg Raise Sciatic Nerve Flossing  - 1 x daily - 7 x  weekly - 1 sets - 2 reps - 1 min hold - Supine Piriformis Stretch  - 1 x daily - 7 x weekly - 1 sets - 2 reps - 1 min hold - Supine Lower Trunk Rotation  - 1 x daily - 7 x weekly - 3 sets - 10 reps - Supine Bridge  - 1 x daily - 7 x weekly - 3 sets - 10 reps - Curl Up with Reach  - 1 x daily - 7 x weekly - 3 sets - 10 reps - Clamshell  - 1 x daily - 7 x weekly - 3 sets - 10 reps - Hooklying Isometric Hip Abduction Adduction with Belt and Ball  - 1 x daily - 7 x weekly - 3 sets - 10 reps - Seated Hip Flexor Stretch  - 1 x daily - 7 x weekly - 2 sets - 2 reps - 30 sec hold - Supine ITB Stretch with Strap  - 1 x daily - 7 x weekly - 2 sets - 2 reps - 30 sec hold - Sidelying Hip Abduction  - 1 x daily - 7 x weekly - 2 sets - 10 reps - Supine Quadriceps Stretch with Strap on Table  - 1 x daily - 7 x weekly - 2 sets - 2 reps - 30 sec hold   ASSESSMENT:  CLINICAL IMPRESSION: Pt responds very well to manual techniques noting less pain after the interventions last session. She notes at least 25% improvement in pain. Continues to have some tender points throughout R gluteals and proximal thigh.  Will try to progress some lateral hip strengthening next visit.   EVAL ;Lauren Soto is a 53 y.o. female who was referred to physical therapy for evaluation and treatment for R hip pain, R SI pain, and fibromyalgia   Patient reports onset of back and RLE pain beginning years ago. Pain is worse with prolonged standing, walking, bending, lifting.   She also has pain at night with disturbed sleep.  It is difficult to differentiate the sources of her pain since they are widespread over her back, hip, SI, trochanteric, IT, and over the  buttock in general.    Patient has deficits in lumbar ROM,  B LE flexibility, BLE strength, abnormal posture, and TTP with abnormal muscle tension in the R hip which are interfering with ADLs and are impacting quality of life.  On Modified Oswestry patient scored 26 / 50 or 52 % disability.  Lauren Soto will benefit from skilled PT to address above deficits to improve mobility and activity tolerance with decreased pain interference.     OBJECTIVE IMPAIRMENTS: difficulty walking, decreased ROM, decreased strength, impaired flexibility, postural dysfunction, and pain.   ACTIVITY LIMITATIONS: carrying, lifting, bending, standing, sleeping, and locomotion level  PARTICIPATION LIMITATIONS: meal prep, cleaning, laundry, community activity, and occupation  PERSONAL FACTORS: Age, Fitness, Time since onset of injury/illness/exacerbation, and 1-2 comorbidities: Diabetes, HTN, vertigo, postural dizziness/presyncope are also affecting patient's functional outcome.   REHAB POTENTIAL: Good  CLINICAL DECISION MAKING: Evolving/moderate complexity  EVALUATION COMPLEXITY: Moderate   GOALS: Goals reviewed with patient? Yes  SHORT TERM GOALS: Target date: 03/23/2024   Patient will be independent with initial HEP to improve outcomes and carryover.  Baseline: 100% PT assist required for correct completion 8/27:  Patient requires assist for correct completion of HEP, but more independent Goal status: PARTIALLY MET - 03/25/24   2.  Patient will report 25% improvement in low back pain to improve QOL. Baseline: 10/10  worst 8/27:  8/10 Goal status: MET- 03/25/24 LONG TERM GOALS: Target date: 04/20/2024   Patient will be independent with ongoing/advanced HEP for self-management at home.  Baseline: no advanced HEP yet 8/27:  advanced today Goal status: IN PROGRESS  2.  Patient will report 50-75% improvement in low back pain to improve QOL.  Baseline: 10/10 worst Goal status: INITIAL  3.  Patient to demonstrate  ability to achieve and maintain good spinal alignment/posturing and body mechanics needed for daily activities. Baseline: hyperlordotic with poor abdominal control Goal status: INITIAL  4.  Patient will demonstrate full pain free lumbar ROM to perform ADLs.   Baseline: Refer to above lumbar ROM table Goal status: IN PROGRESS- 03/25/24 ROM improved still pain with movement  5.  Patient will demonstrate improved BLE strength to >/= 4 to 4+/5 for improved stability and ease of mobility. Baseline: Refer to above LE MMT table Goal status: INITIAL  6. Patient will report </= 35% on Modified Oswestry (MCID = 12%) to demonstrate improved functional ability with decreased pain interference. Baseline: 26 / 50 or 52 % Goal status: INITIAL  7.  Patient will tolerate 6 hrs of (standing/sitting/walking) w/o increased pain to allow for  improved mobility and activity tolerance to be able to work her job full duty. Baseline: works but is in pain all day Goal status: INITIAL  8.  Patient will report centralization of radicular symptoms.  Baseline: reports pain into the R hip/thigh Goal status: INITIAL PLAN:  PT FREQUENCY: 1-2x/week  PT DURATION: 8 weeks  PLANNED INTERVENTIONS: 97110-Therapeutic exercises, 97530- Therapeutic activity, 97112- Neuromuscular re-education, 97535- Self Care, 02859- Manual therapy, G0283- Electrical stimulation (unattended), 97016- Vasopneumatic device, [id number]- Ultrasound, [id number]- Traction (mechanical), [id number]- Ionotophoresis 4mg /ml Dexamethasone, 79439 (1-2 muscles), 20561 (3+ muscles)- Dry Needling, Patient/Family education, Balance training, Stair training, Taping, Joint mobilization, Spinal mobilization, Cryotherapy, and Moist heat  PLAN FOR NEXT SESSION: Address soft tissue restrictions in piriformis and TFL; light strengthening lateral R hip;  Try R pelvic sideglides, revisit lumbar extension with R bias;   Sol LITTIE Gaskins, PTA 03/25/2024, 4:32 PM

## 2024-03-29 ENCOUNTER — Ambulatory Visit: Payer: Self-pay

## 2024-03-29 ENCOUNTER — Ambulatory Visit: Admitting: Rehabilitation

## 2024-03-29 NOTE — Telephone Encounter (Signed)
 Patient has appt scheduled with Melissa for tomorrow.

## 2024-03-29 NOTE — Telephone Encounter (Signed)
 FYI Only or Action Required?: FYI only for provider.  Patient was last seen in primary care on 03/22/2024 by [name], NP.  Called Nurse Triage reporting Diarrhea.  Symptoms began several days ago.  Interventions attempted: Rest, hydration, or home remedies.  Symptoms are: gradually worsening.  Triage Disposition: See Physician Within 24 Hours  Patient/caregiver understands and will follow disposition?: Yes  CRM # [id number] CRM # [id number] Source  Lauren Soto, [name] (Patient)   Subject  Lauren Soto, Lauren Soto (Patient)   Topic  Clinical - Red Word Triage   Voided CRM from Changed Reason for Contact  [id number]    Communication  Red Word that prompted transfer to Nurse Triage: Runny bowel movements, stomach feels queasy, can't eat-food comes back up, does not have appetite    Patient feels she may be okay for a few days and then symptoms start up again.       Reason for Disposition  [1] MODERATE diarrhea (e.g., 4-6 times / day more than normal) AND [2] present > 48 hours (2 days)  Answer Assessment - Initial Assessment Questions 1. DIARRHEA SEVERITY: How bad is the diarrhea? How many more stools have you had in the past 24 hours than normal?      3x day 2. ONSET: When did the diarrhea begin?      Started up again yesterday but has been off and on for a couple weeks 3. STOOL DESCRIPTION:  How loose or watery is the diarrhea? What is the stool color? Is there any blood or mucous in the stool?     Normal color- runny but not water 4. VOMITING: Are you also vomiting? If Yes, ask: How many times in the past 24 hours?      Nauseous and feels like it will come up if she does eat 5. ABDOMEN PAIN: Are you having any abdomen pain? If Yes, ask: What does it feel like? (e.g., crampy, dull, intermittent, constant)      Some cramping intermittently  6. ABDOMEN PAIN SEVERITY: If present, ask: How bad is the pain?  (e.g., Scale 1-10; mild, moderate, or  severe)     4-5/10  7. ORAL INTAKE: If vomiting, Have you been able to drink liquids? How much liquids have you had in the past 24 hours?     Yes- water, gingerale, sprite, tea 8. HYDRATION: Any signs of dehydration? (e.g., dry mouth [not just dry lips], too weak to stand, dizziness, new weight loss) When did you last urinate?     Dizziness and dry mouth -- dizzy all weekend- when bending over and standing up- hx of vertigo but feels more light headed the spinning 9. EXPOSURE: Have you traveled to a foreign country recently? Have you been exposed to anyone with diarrhea? Could you have eaten any food that was spoiled?     denies 10. ANTIBIOTIC USE: Are you taking antibiotics now or have you taken antibiotics in the past 2 months?       denies 11. OTHER SYMPTOMS: Do you have any other symptoms? (e.g., fever, blood in stool)       Dizziness when standing and bending over.  Protocols used: [facility]

## 2024-03-30 ENCOUNTER — Ambulatory Visit: Admitting: Family

## 2024-03-30 VITALS — BP 116/83 | HR 103 | Temp 99.0°F | Resp 16 | Ht 60.0 in | Wt 187.0 lb

## 2024-03-30 DIAGNOSIS — R197 Diarrhea, unspecified: Secondary | ICD-10-CM | POA: Diagnosis not present

## 2024-03-30 LAB — COMPREHENSIVE METABOLIC PANEL WITH GFR
AG Ratio: 1.4 (calc) (ref 1.0–2.5)
ALT: 11 U/L (ref 6–29)
AST: 13 U/L (ref 10–35)
Albumin: 4.1 g/dL (ref 3.6–5.1)
Alkaline phosphatase (APISO): 137 U/L (ref 37–153)
BUN: 10 mg/dL (ref 7–25)
CO2: 33 mmol/L — ABNORMAL HIGH (ref 20–32)
Calcium: 9.6 mg/dL (ref 8.6–10.4)
Chloride: 98 mmol/L (ref 98–110)
Creat: 0.69 mg/dL (ref 0.50–1.03)
Globulin: 3 g/dL (ref 1.9–3.7)
Glucose, Bld: 119 mg/dL — ABNORMAL HIGH (ref 65–99)
Potassium: 3.8 mmol/L (ref 3.5–5.3)
Sodium: 138 mmol/L (ref 135–146)
Total Bilirubin: 0.3 mg/dL (ref 0.2–1.2)
Total Protein: 7.1 g/dL (ref 6.1–8.1)
eGFR: 104 mL/min/1.73m2 (ref >=60)

## 2024-03-30 LAB — CBC WITH DIFFERENTIAL/PLATELET
Absolute Lymphocytes: 3482 {cells}/uL (ref 850–3900)
Absolute Monocytes: 698 {cells}/uL (ref 200–950)
Basophils Absolute: 39 {cells}/uL (ref 0–200)
Basophils Relative: 0.4 %
Eosinophils Absolute: 87 {cells}/uL (ref 15–500)
Eosinophils Relative: 0.9 %
HCT: 36.8 % (ref 35.0–45.0)
Hemoglobin: 11.9 g/dL (ref 11.7–15.5)
MCH: 28.1 pg (ref 27.0–33.0)
MCHC: 32.3 g/dL (ref 32.0–36.0)
MCV: 86.8 fL (ref 80.0–100.0)
MPV: 9.8 fL (ref 7.5–12.5)
Monocytes Relative: 7.2 %
Neutro Abs: 5393 {cells}/uL (ref 1500–7800)
Neutrophils Relative %: 55.6 %
Platelets: 342 Thousand/uL (ref 140–400)
RBC: 4.24 Million/uL (ref 3.80–5.10)
RDW: 13.5 % (ref 11.0–15.0)
Total Lymphocyte: 35.9 %
WBC: 9.7 Thousand/uL (ref 3.8–10.8)

## 2024-03-30 LAB — LIPASE: Lipase: 46 U/L (ref 7–60)

## 2024-03-30 NOTE — Patient Instructions (Addendum)
 VISIT SUMMARY:  You came in today because you've been having diarrhea for the past two weeks, along with some abdominal discomfort. We discussed possible causes and planned some tests to find out more.  YOUR PLAN:  DIARRHEA AND ABDOMINAL PAIN: You have been experiencing intermittent diarrhea and abdominal pain since returning from a trip. This could be due to a viral or bacterial infection, or an inflammatory condition. Your recent antibiotic use also increases the risk of a specific bacterial infection called C. difficile. -We will do a blood test to check your white blood cell count. -We will check your kidney function and electrolyte levels. -You will receive a stool test kit to check for E. coli and C. difficile. -Drink plenty of fluids with electrolytes to stay hydrated. -Take a probiotic to help your digestive system. -Stop taking hydrochlorothiazide  until your diarrhea resolves. -Go to the ER if your pain gets worse or if you can't keep down food or liquids. - Let us  know if your symptoms fail to improve.

## 2024-03-30 NOTE — Progress Notes (Signed)
 Subjective:     Patient ID: Lauren Soto, female    DOB: 06-18-1971, 53 y.o.   MRN: [id number]  Chief Complaint  Patient presents with   Diarrhea    Patient reports diarrhea on and off for about 2 weeks.     HPI  Discussed the use of AI scribe software for clinical note transcription with the patient, who gave verbal consent to proceed.  History of Present Illness  Lauren Soto is a 53 year old female who presents with diarrhea persisting for two weeks.  She experiences intermittent diarrhea with liquidy stools of normal color, typically having three to four bowel movements on days with diarrhea. There were two occurrences yesterday. There is no recent travel-related illness, though symptoms began a week after returning from [place] and the [place].  Abdominal pain is described as a sensation of pressure rather than cramping. There is no blood in the stool, nausea, vomiting, or increased fatigue. She experiences early satiety, feeling full after minimal food intake.  Her medical history includes an episode of strep throat treated with antibiotics over the summer, followed by severe abdominal pain and a high white blood cell count. She also experienced sepsis at the end of June last year. She is currently managing diabetes.    Health Maintenance Due  Topic Date Due   OPHTHALMOLOGY EXAM  Never done   Diabetic kidney evaluation - Urine ACR  Never done   Pneumococcal Vaccine: 50+ Years (1 of 2 - PCV) Never done   Cervical Cancer Screening (HPV/Pap Cotest)  03/10/2017   Zoster Vaccines- Shingrix (1 of 2) Never done   FOOT EXAM  11/06/2023   COVID-19 Vaccine (1 - 2024-25 season) Never done    Past Medical History:  Diagnosis Date   Acute cystitis 02/03/2023   AKI (acute kidney injury) 02/02/2023   Asthma 04/21/2014   Chronic right hip pain 11/06/2022   Diabetes mellitus without complication (HCC)    Essential hypertension 05/25/2012   GERD  (gastroesophageal reflux disease) 02/03/2023   Hyperlipidemia 11/06/2022   Hypertension    Long term current use of oral hypoglycemic drug 11/06/2022   Menorrhagia 06/08/2012   Non-insulin  dependent type 2 diabetes mellitus (HCC) 06/18/2018   Pelvic pain in female 06/08/2012   Postural dizziness with presyncope 02/03/2023   Severe sepsis (HCC) 02/03/2023   Uterine fibroid 05/25/2012   Vertigo    Vestibular migraine 07/06/2018    Past Surgical History:  Procedure Laterality Date   ABDOMINAL HYSTERECTOMY     BUNIONECTOMY     CHOLECYSTECTOMY     HERNIA REPAIR     umblical   SHOULDER ARTHROSCOPY      Family History  Problem Relation Age of Onset   Neuropathy Mother    Dementia Mother    Hypertension Mother    Multiple myeloma Mother    Arthritis Mother    Dementia Father    Hypertension Father     Social History   Socioeconomic History   Marital status: Married    Spouse name: Not on file   Number of children: Not on file   Years of education: Not on file   Highest education level: Not on file  Occupational History   Not on file  Tobacco Use   Smoking status: Former    Current packs/day: 0.00    Types: Cigarettes    Quit date: 2009    Years since quitting: 16.7   Smokeless tobacco: Never   Tobacco comments:  Every once in awhile will vape  Vaping Use   Vaping status: Some Days   Last attempt to quit: 07/04/2022  Substance and Sexual Activity   Alcohol use: No   Drug use: No   Sexual activity: Yes    Birth control/protection: Surgical  Other Topics Concern   Not on file  Social History Narrative   Are you right handed or left handed? Right   Are you currently employed ? NO   What is your current occupation?   Do you live at home alone?   Who lives with you?    What type of home do you live in: 1 story or 2 story? 1       Social Drivers of Corporate investment banker Strain: Not on file  Food Insecurity: No Food Insecurity (02/03/2023)   Hunger  Vital Sign    Worried About Running Out of Food in the Last Year: Never true    Ran Out of Food in the Last Year: Never true  Transportation Needs: No Transportation Needs (02/03/2023)   PRAPARE - Administrator, Civil Service (Medical): No    Lack of Transportation (Non-Medical): No  Physical Activity: Not on file  Stress: Not on file  Social Connections: Not on file  Intimate Partner Violence: Not At Risk (02/03/2023)   Humiliation, Afraid, Rape, and Kick questionnaire    Fear of Current or Ex-Partner: No    Emotionally Abused: No    Physically Abused: No    Sexually Abused: No    Outpatient Medications Prior to Visit  Medication Sig Dispense Refill   Atogepant  (QULIPTA ) 60 MG TABS Take 1 tablet (60 mg total) by mouth daily. 30 tablet 11   atorvastatin  (LIPITOR) 20 MG tablet Take 1 tablet (20 mg total) by mouth daily. 90 tablet 1   Blood Glucose Monitoring Suppl DEVI 1 each by Does not apply route in the morning, at noon, and at bedtime. May substitute to any manufacturer covered by patient's insurance. 1 each 0   cyclobenzaprine  (FLEXERIL ) 10 MG tablet Take 1 tablet by mouth three times daily as needed for muscle spasm 40 tablet 0   diclofenac Sodium (VOLTAREN) 1 % GEL Apply 2 g topically 4 (four) times daily as needed (pain).     dicyclomine  (BENTYL ) 20 MG tablet Take 0.5 tablets (10 mg total) by mouth 2 (two) times daily as needed for spasms. 10 tablet 0   DULoxetine  (CYMBALTA ) 30 MG capsule Start with 30 mg daily for 2 weeks, then increase to 60 mg ( 2 capsules) daily. 60 capsule 3   famotidine  (PEPCID ) 20 MG tablet Take 1 tablet (20 mg total) by mouth 2 (two) times daily. 60 tablet 0   hydrochlorothiazide  (HYDRODIURIL ) 12.5 MG tablet Take 1 tablet (12.5 mg total) by mouth daily. 90 tablet 1   Insulin  Pen Needle (NOVOFINE PEN NEEDLE) 32G X 6 MM MISC 1 Device by Does not apply route daily. To use with Victoza  100 each 2   liraglutide  (VICTOZA ) 18 MG/3ML SOPN Start 0.6mg  SQ  once a day for 7 days, then increase to 1.2mg  once a day 6 mL 3   lisinopril  (ZESTRIL ) 40 MG tablet Take 1 tablet (40 mg total) by mouth daily. 90 tablet 1   meclizine  (ANTIVERT ) 25 MG tablet Take 1 tablet (25 mg total) by mouth 3 (three) times daily as needed for dizziness. 30 tablet 5   metFORMIN  (GLUCOPHAGE ) 850 MG tablet Take 1 tablet (850 mg total)  by mouth 2 (two) times daily. 180 tablet 1   metoprolol  succinate (TOPROL -XL) 50 MG 24 hr tablet Take 1 tablet (50 mg total) by mouth daily. Take with or immediately following a meal. 90 tablet 1   nitroGLYCERIN  (NITROSTAT ) 0.4 MG SL tablet Place 1 tablet (0.4 mg total) under the tongue every 5 (five) minutes as needed for chest pain. 20 tablet 3   ondansetron  (ZOFRAN -ODT) 4 MG disintegrating tablet Take 1 tablet (4 mg total) by mouth every 8 (eight) hours as needed. 20 tablet 5   pantoprazole  (PROTONIX ) 20 MG tablet Take 1 tablet (20 mg total) by mouth daily. 30 tablet 0   pregabalin  (LYRICA ) 75 MG capsule Take 1 capsule (75 mg total) by mouth 2 (two) times daily. 60 capsule 5   SUMAtriptan  (IMITREX ) 100 MG tablet Take 1 tablet (100 mg total) by mouth as needed for migraine. May repeat in 2 hours if headache persists or recurs.  Maximum 2 tablets in 24 hours. 10 tablet 5   urea  (CARMOL) 10 % cream Apply topically as needed. 71 g 0   No facility-administered medications prior to visit.    No Known Allergies  ROS See HPI    Objective:    Physical Exam Constitutional:      General: She is not in acute distress.    Appearance: Normal appearance. She is well-developed.  HENT:     Head: Normocephalic and atraumatic.     Right Ear: External ear normal.     Left Ear: External ear normal.  Eyes:     General: No scleral icterus. Neck:     Thyroid: No thyromegaly.  Cardiovascular:     Rate and Rhythm: Normal rate and regular rhythm.     Heart sounds: Normal heart sounds. No murmur heard. Pulmonary:     Effort: Pulmonary effort is normal.  No respiratory distress.     Breath sounds: Normal breath sounds. No wheezing.  Abdominal:     General: Bowel sounds are normal. There is no distension.     Palpations: Abdomen is soft. There is no mass.     Tenderness: There is generalized abdominal tenderness. There is no guarding or rebound.  Musculoskeletal:     Cervical back: Neck supple.  Skin:    General: Skin is warm and dry.  Neurological:     Mental Status: She is alert and oriented to person, place, and time.  Psychiatric:        Mood and Affect: Mood normal.        Behavior: Behavior normal.        Thought Content: Thought content normal.        Judgment: Judgment normal.      BP 116/83 (BP Location: Right Arm, Patient Position: Sitting, Cuff Size: Large)   Pulse (!) 103   Temp 99 F (37.2 C) (Oral)   Resp 16   Ht 5' (1.524 m)   Wt 187 lb (84.8 kg)   LMP 06/04/2012   SpO2 100%   BMI 36.52 kg/m  Wt Readings from Last 3 Encounters:  03/30/24 187 lb (84.8 kg)  03/22/24 187 lb (84.8 kg)  01/19/24 196 lb (88.9 kg)       Assessment & Plan:   Problem List Items Addressed This Visit       Unprioritized   Diarrhea - Primary    Intermittent diarrhea with abdominal pain post-travel. Differential includes viral gastroenteritis, bacterial infection, or inflammatory condition. Recent antibiotics increase C. difficile risk. Dehydration likely  causing dizziness. - Order CBC for WBC count. - Order CMET to assess electrolytes, renal function and LFT. - Obtain GI Profile for further evaluation. - Recommend aggressive hydration with sugar free electrolyte drinks such as Gatorade zero. - Recommend addition of a probiotic. - Hold hydrochlorothiazide  until diarrhea completely resolves. - Instruct ER visit if pain worsens or unable to keep down food or liquids. Pt verbalizes understanding.        Relevant Orders   Comp Met (CMET)   CBC w/Diff   Lipase   GI Profile, Stool, PCR    I am having Lauren Soto  maintain her diclofenac Sodium, nitroGLYCERIN , urea , atorvastatin , hydrochlorothiazide , lisinopril , metFORMIN , metoprolol  succinate, Novofine Pen Needle, dicyclomine , Qulipta , SUMAtriptan , ondansetron , meclizine , Blood Glucose Monitoring Suppl, famotidine , pantoprazole , liraglutide , DULoxetine , pregabalin , and cyclobenzaprine .  No orders of the defined types were placed in this encounter.

## 2024-03-30 NOTE — Assessment & Plan Note (Signed)
  Intermittent diarrhea with abdominal pain post-travel. Differential includes viral gastroenteritis, bacterial infection, or inflammatory condition. Recent antibiotics increase C. difficile risk. Dehydration likely causing dizziness. - Order CBC for WBC count. - Order CMET to assess electrolytes, renal function and LFT. - Obtain GI Profile for further evaluation. - Recommend aggressive hydration with sugar free electrolyte drinks such as Gatorade zero. - Recommend addition of a probiotic. - Hold hydrochlorothiazide  until diarrhea completely resolves. - Instruct ER visit if pain worsens or unable to keep down food or liquids. Pt verbalizes understanding.

## 2024-03-31 ENCOUNTER — Ambulatory Visit: Payer: Self-pay | Admitting: Family

## 2024-04-06 ENCOUNTER — Encounter: Payer: Self-pay | Admitting: Rehabilitation

## 2024-04-06 ENCOUNTER — Ambulatory Visit: Attending: Physical Medicine and Rehabilitation | Admitting: Rehabilitation

## 2024-04-06 DIAGNOSIS — R262 Difficulty in walking, not elsewhere classified: Secondary | ICD-10-CM | POA: Diagnosis present

## 2024-04-06 DIAGNOSIS — R293 Abnormal posture: Secondary | ICD-10-CM | POA: Diagnosis present

## 2024-04-06 DIAGNOSIS — M6281 Muscle weakness (generalized): Secondary | ICD-10-CM | POA: Diagnosis present

## 2024-04-06 DIAGNOSIS — M25651 Stiffness of right hip, not elsewhere classified: Secondary | ICD-10-CM | POA: Insufficient documentation

## 2024-04-06 DIAGNOSIS — M5441 Lumbago with sciatica, right side: Secondary | ICD-10-CM | POA: Insufficient documentation

## 2024-04-06 DIAGNOSIS — G8929 Other chronic pain: Secondary | ICD-10-CM | POA: Insufficient documentation

## 2024-04-06 NOTE — Therapy (Addendum)
 OUTPATIENT PHYSICAL THERAPY THORACOLUMBAR TREATMENT / DC SUMMARY   Patient Name: Lauren Soto MRN: [id number] DOB:[birth year], 53 y.o., female Today's Date: 04/06/2024  END OF SESSION:  PT End of Session - 04/06/24 1536     Visit Number 6    Date for Recertification  04/19/24    Authorization Type [employer] [facility] Medicaid    PT Start Time 1537    PT Stop Time 1630    PT Time Calculation (min) 53 min    Activity Tolerance Patient tolerated treatment well    Behavior During Therapy [facility] for tasks assessed/performed             Past Medical History:  Diagnosis Date   Acute cystitis 02/03/2023   AKI (acute kidney injury) 02/02/2023   Asthma 04/21/2014   Chronic right hip pain 11/06/2022   Diabetes mellitus without complication (HCC)    Essential hypertension 05/25/2012   GERD (gastroesophageal reflux disease) 02/03/2023   Hyperlipidemia 11/06/2022   Hypertension    Long term current use of oral hypoglycemic drug 11/06/2022   Menorrhagia 06/08/2012   Non-insulin  dependent type 2 diabetes mellitus (HCC) 06/18/2018   Pelvic pain in female 06/08/2012   Postural dizziness with presyncope 02/03/2023   Severe sepsis (HCC) 02/03/2023   Uterine fibroid 05/25/2012   Vertigo    Vestibular migraine 07/06/2018   Past Surgical History:  Procedure Laterality Date   ABDOMINAL HYSTERECTOMY     BUNIONECTOMY     CHOLECYSTECTOMY     HERNIA REPAIR     umblical   SHOULDER ARTHROSCOPY     Patient Active Problem List   Diagnosis Date Noted   Diarrhea 03/30/2024   Dysuria 01/19/2024   Polyarthralgia 11/27/2023   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   Fibromyalgia 09/10/2023   Other insomnia 09/10/2023   Obesity (BMI 35.0-39.9 without comorbidity) 03/19/2023   Vertigo    Severe sepsis (HCC) 02/03/2023   Postural dizziness with presyncope 02/03/2023   GERD (gastroesophageal reflux disease) 02/03/2023   Acute cystitis 02/03/2023   AKI (acute kidney injury) 02/02/2023    Long term current use of oral hypoglycemic drug 11/06/2022   Chronic right hip pain 11/06/2022   Hyperlipidemia 11/06/2022   Vestibular migraine 07/06/2018   Non-insulin  dependent type 2 diabetes mellitus (HCC) 06/18/2018   Asthma 04/21/2014   Menorrhagia 06/08/2012   Pelvic pain in female 06/08/2012   Essential hypertension 05/25/2012   Uterine fibroid 05/25/2012    PCP: [name], NP   REFERRING PROVIDER: Emeline Joesph BROCKS, DO   REFERRING DIAG: [phone] (ICD-10-CM) - Chronic right hip pain M53.3,G89.29 (ICD-10-CM) - Chronic right SI joint pain M79.7 (ICD-10-CM) - Fibromyalgia  THERAPY DIAG:  Chronic right-sided low back pain with right-sided sciatica  Muscle weakness (generalized)  Stiffness of right hip, not elsewhere classified  Abnormal posture  Difficulty in walking, not elsewhere classified  RATIONALE FOR EVALUATION AND TREATMENT: Rehabilitation  ONSET DATE: years  NEXT MD VISIT: unknown   SUBJECTIVE:  SUBJECTIVE STATEMENT: Patient reports her pain today is 8/10 but was even worse last night.  She will f/u with Dr Emeline week of 10/20  Patient is a 53 y/o referred to PT for chronic R hip pain, chronic R SI jt pain, and fibromyalgia.  She states her pain has been going on for a long time now and is just getting worse.  She has seen us  in the past but has not been doing any of her home exercises from previously.  She reports constant pain in her low back with radiation into her R hip and posterolateral thigh that varies in intensity.  She works at [employer] and has a physical job requiring lifting, bending, twisting.  She reports pain gets worse as the day goes on but that she also has night pain with difficulty sleeping over 4 hrs at a times due to  the pain.  She is taking Lyrica  but doesn't feel it is giving her much relief.  She has had injections in the back, SI, and R posterior trochanter per her report but states they didn't help much either.  Last MD note states not planning for any further injections due to no relief of symptoms.   PAIN: Are you having pain? No and Yes: NPRS scale: 4/10 Pain location: R low back and glutes Pain description: aching, stabbing Aggravating factors: Prolonged standing, bending, lifting Relieving factors: resting, meds  PERTINENT HISTORY:  Diabetes, HTN, vertigo, postural dizziness/presyncope  PRECAUTIONS: None  RED FLAGS: None  WEIGHT BEARING RESTRICTIONS: No  FALLS:  Has patient fallen in last 6 months? Yes. Number of falls twice this year;  tripped over dog once, other time possibly in the kitchen at school.    LIVING ENVIRONMENT: Lives with: lives with their family Lives in: House/apartment Stairs: No Has following equipment at home: None  OCCUPATION: Personnel Officer at [employer];  [employer]; works in surveyor, mining doing heavy lifting, standing  PLOF: Independent with gait  PATIENT GOALS: not hurt so much   OBJECTIVE: (objective measures completed at initial evaluation unless otherwise dated)  DIAGNOSTIC FINDINGS:  . Subtle anterolisthesis at L4-L5 with at least moderate facet arthropathy. No spinal stenosis but mild lateral recess and left foraminal stenosis at that level. 2. L5-S1 disc degeneration with a small rightward disc herniation most affecting the right lateral recess. Query right S1 radiculitis. 3. Partially visible lower thoracic disc degeneration with up to mild lower thoracic spinal stenosis.   PATIENT SURVEYS:  ODI = 26 / 50 or 52 %  SCREENING FOR RED FLAGS: Bowel or bladder incontinence: No Spinal tumors: No Cauda equina syndrome: No Compression fracture: No Abdominal aneurysm: No  COGNITION:  Overall cognitive status: Within functional  limits for tasks assessed    SENSATION: WFL  POSTURE:  Valgus knees, leans to the R, some R lateral shift, hyperlordotic  PALPATION: TTP over central lumbar spine, R lumbar spine, B PSIS, R hip, R greater trochanter posteriorly, R IT band  LUMBAR ROM:   Active  Eval 03/25/24 04/06/24  Flexion To knees, increased p! To ankles    Extension 50%; more p! 25% 50% with a lot of end range pain  Right lateral flexion To mid thigh; p! To distal thigh   Left lateral flexion To mid thigh; p! To dista   Right rotation 60%; p!  50%; more p!  Left rotation 40%; mild p!  60%; less p!  (Blank rows = not tested)  MUSCLE LENGTH: Hamstrings: Right SLR 50  deg; Left SLR 65 deg Thomas test: Right 0 deg; Left 0 deg Hamstrings: severe tightness BLE ITB: min tightness BLE Piriformis: mod tightness BLE Hip flexors:  Quads:  Heelcord:   LOWER EXTREMITY ROM:     Active  Right eval Left eval  Hip flexion    Hip extension    Hip abduction    Hip adduction    Hip internal rotation 30 40  Hip external rotation 50 50  Knee flexion    Knee extension    Ankle dorsiflexion    Ankle plantarflexion    Ankle inversion    Ankle eversion    (Blank rows = not tested)  LOWER EXTREMITY MMT:    MMT Right eval Left eval  Hip flexion 4- 4  Hip extension    Hip abduction 4- 4-  Hip adduction    Hip internal rotation 4- 4  Hip external rotation 4 4  Knee flexion 5 5  Knee extension 5 5  Ankle dorsiflexion 4 4  Ankle plantarflexion    Ankle inversion    Ankle eversion     (Blank rows = not tested)  LUMBAR SPECIAL TESTS:  Straight leg raise test: Positive, Slump test: Negative, FABER test: Negative, and Gaenslen's test: Negative  FUNCTIONAL TESTS:  TBD  GAIT: Distance walked: 200  feet into clinic Assistive device utilized: None Level of assistance: Complete Independence Gait pattern: decreased step length- Left, decreased stance time- Right, and lateral lean- Right Comments: leans to the R,  limps mildly on RLE; very hyperlordotic with poor core and hip control   TODAY'S TREATMENT:  04/06/24 Bike L0 x 6'  Sidestepping blue TB at ankles x 4 laps at treatment room counter Monster walks blue TB knees x 4 laps F/B at counter R pelvic sideglides x 10 R lumbar extension w/ R bias x 10 R pelvic sideglides w/ R rotation x 10 Supine hamstring stretch x 1' x 3 RLE  MODALITIES:  Intermittent Pelvic traction at 90/90  positioning and 40 degree table positioning with posterior pelvic pull with 75 lbs at 60 sec on/20 sec off x 15'  03/25/24 Nustep L5x5 min IASTM and TPR to R piriformis, TFL, ITB, VL Supine R ITB stretch with strap 2x30 Supine R quad stretch w/ strap 2x30 S/L R hip abduction x 10  03/23/24 Bike L1x32min Hip mobs inferior glide  Long leg distraction RLE IASTM And TPR to R piriformis and TFL Hip flexor manual stretch side-lying 2x30'  Seated hip flexor stretch reviewed  03/18/24 Bike L2x56min LTR both ways x 15 each LE Supine RLE piriformis stretch x 1 min Bridges with TrA x 25  S/L clamshells YTB x 20 Supine hamstring stretch with sciatic nerve mobilization x 10  Seated mod piriformis stretch x 1 min  02/25/24 THERAPEUTIC EXERCISE: To improve strength and ROM.  Demonstration, verbal and tactile cues throughout for technique. NuStep L3 x 6'  THERAPEUTIC ACTIVITIES: To improve functional performance.  Demonstration, verbal and tactile cues throughout for technique. Supine peanut ball rollouts x 20 BLE Supine peanut ball bridge x 20 BLE Supine small swiss ball S/S x 20 BLE Supine cross over piriformis stretch x 1' x 2 BLE Supine Strap assist SLR nerve glide/foot pumps x 20 BLE  NEUROMUSCULAR RE-EDUCATION: To improve kinesthesia. And spine/pelvic stabilization Supine ball & belt isometrics x 20 each way with cueing for co contraction rather than doing the contraction separately, has difficulty Transverse abs isometrics x 10 w/ manual facilitation and  teaching for palpating  umbilicus and feeling the slight tension SL clams/TA x 20 BLE Bridge/TA x 20 BLE Hooklying curl up x 20   02/23/24 SELF CARE: Provided education on PT POC progression, on pain management options, to promote safe home environment, and lifting safety; initial hep.   PATIENT EDUCATION:  Education details: trying to do R sideglides f/b extension toward the R but only going until she feels it in her back very slightly  Person educated: Patient Education method: Explanation, Demonstration, Verbal cues, Tactile cues, and Handouts Education comprehension: verbalized understanding, verbal cues required, tactile cues required, and needs further education  HOME EXERCISE PROGRAM: Access Code: [email or web address] URL: [email or web address] Date: 03/25/2024 Prepared by: Braylin Clark  Exercises - Straight Leg Raise Sciatic Nerve Flossing  - 1 x daily - 7 x weekly - 1 sets - 2 reps - 1 min hold - Supine Piriformis Stretch  - 1 x daily - 7 x weekly - 1 sets - 2 reps - 1 min hold - Supine Lower Trunk Rotation  - 1 x daily - 7 x weekly - 3 sets - 10 reps - Supine Bridge  - 1 x daily - 7 x weekly - 3 sets - 10 reps - Curl Up with Reach  - 1 x daily - 7 x weekly - 3 sets - 10 reps - Clamshell  - 1 x daily - 7 x weekly - 3 sets - 10 reps - Hooklying Isometric Hip Abduction Adduction with Belt and Ball  - 1 x daily - 7 x weekly - 3 sets - 10 reps - Seated Hip Flexor Stretch  - 1 x daily - 7 x weekly - 2 sets - 2 reps - 30 sec hold - Supine ITB Stretch with Strap  - 1 x daily - 7 x weekly - 2 sets - 2 reps - 30 sec hold - Sidelying Hip Abduction  - 1 x daily - 7 x weekly - 2 sets - 10 reps - Supine Quadriceps Stretch with Strap on Table  - 1 x daily - 7 x weekly - 2 sets - 2 reps - 30 sec hold   ASSESSMENT:  CLINICAL IMPRESSION: Patient having persistent central LBP and R sided radicular symptoms with slow response to PT.   Added more extension based activity again today with  sideglides to correct the R lateral shift that she still presents with.   Added pelvic traction as well to see if her radicular symptoms can centralize.   She will see us  x 3 more visits prior to return to MD.   PT remains necessary for pain, ROM, strength deficits.  Continue per poc  EVAL ;Mande Auvil Steed-Matthews is a 53 y.o. female who was referred to physical therapy for evaluation and treatment for R hip pain, R SI pain, and fibromyalgia   Patient reports onset of back and RLE pain beginning years ago. Pain is worse with prolonged standing, walking, bending, lifting.   She also has pain at night with disturbed sleep.  It is difficult to differentiate the sources of her pain since they are widespread over her back, hip, SI, trochanteric, IT, and over the buttock in general.    Patient has deficits in lumbar ROM,  B LE flexibility, BLE strength, abnormal posture, and TTP with abnormal muscle tension in the R hip which are interfering with ADLs and are impacting quality of life.  On Modified Oswestry patient scored 26 / 50 or 52 % disability.  Julieanne will benefit from skilled PT to  address above deficits to improve mobility and activity tolerance with decreased pain interference.     OBJECTIVE IMPAIRMENTS: difficulty walking, decreased ROM, decreased strength, impaired flexibility, postural dysfunction, and pain.   ACTIVITY LIMITATIONS: carrying, lifting, bending, standing, sleeping, and locomotion level  PARTICIPATION LIMITATIONS: meal prep, cleaning, laundry, community activity, and occupation  PERSONAL FACTORS: Age, Fitness, Time since onset of injury/illness/exacerbation, and 1-2 comorbidities: Diabetes, HTN, vertigo, postural dizziness/presyncope are also affecting patient's functional outcome.   REHAB POTENTIAL: Good  CLINICAL DECISION MAKING: Evolving/moderate complexity  EVALUATION COMPLEXITY: Moderate   GOALS: Goals reviewed with patient? Yes  SHORT TERM GOALS: Target date:  03/23/2024   Patient will be independent with initial HEP to improve outcomes and carryover.  Baseline: 100% PT assist required for correct completion 8/27:  Patient requires assist for correct completion of HEP, but more independent Goal status: PARTIALLY MET - 03/25/24   2.  Patient will report 25% improvement in low back pain to improve QOL. Baseline: 10/10 worst 8/27:  8/10 Goal status: MET- 03/25/24 LONG TERM GOALS: Target date: 04/20/2024   Patient will be independent with ongoing/advanced HEP for self-management at home.  Baseline: no advanced HEP yet 8/27:  advanced today Goal status: IN PROGRESS  2.  Patient will report 50-75% improvement in low back pain to improve QOL.  Baseline: 10/10 worst 04/06/24:  8/10 today, worse last night Goal status: IN PROGRESS  3.  Patient to demonstrate ability to achieve and maintain good spinal alignment/posturing and body mechanics needed for daily activities. Baseline: hyperlordotic with poor abdominal control Goal status: INITIAL  4.  Patient will demonstrate full pain free lumbar ROM to perform ADLs.   Baseline: Refer to above lumbar ROM table Goal status: IN PROGRESS- 04/06/24 updated tables  5.  Patient will demonstrate improved BLE strength to >/= 4 to 4+/5 for improved stability and ease of mobility. Baseline: Refer to above LE MMT table Goal status: INITIAL  6. Patient will report </= 35% on Modified Oswestry (MCID = 12%) to demonstrate improved functional ability with decreased pain interference. Baseline: 26 / 50 or 52 % Goal status: INITIAL  7.  Patient will tolerate 6 hrs of (standing/sitting/walking) w/o increased pain to allow for  improved mobility and activity tolerance to be able to work her job full duty. Baseline: works but is in pain all day Goal status: INITIAL  8.  Patient will report centralization of radicular symptoms.  Baseline: reports pain into the R hip/thigh Goal status: INITIAL PLAN:  PT FREQUENCY:  1-2x/week  PT DURATION: 8 weeks  PLANNED INTERVENTIONS: 97110-Therapeutic exercises, 97530- Therapeutic activity, [id number]- Neuromuscular re-education, 97535- Self Care, [id number]- Manual therapy, [id number]- Electrical stimulation (unattended), 97016- Vasopneumatic device, [id number]- Ultrasound, [id number]- Traction (mechanical), [id number]- Ionotophoresis 4mg /ml Dexamethasone, 79439 (1-2 muscles), 20561 (3+ muscles)- Dry Needling, Patient/Family education, Balance training, Stair training, Taping, Joint mobilization, Spinal mobilization, Cryotherapy, and Moist heat  PLAN FOR NEXT SESSION: see how pelvic traction did and if it helped then try to progress to 100 lb pull for 1/2 body weight.  Address soft tissue restrictions in piriformis and TFL; light strengthening lateral R hip;  Try R pelvic sideglides, revisit lumbar extension with R bias;   PHYSICAL THERAPY DISCHARGE SUMMARY  Visits from Start of Care: 6  Current functional level related to goals / functional outcomes: AS per notes above.   Patient called to cancel remaining appointments stating her pain wasn't improving and she was going to try to go see her MD   Remaining deficits: Was  still having pain    Education / Equipment: Patient is independent with all home exercises and advised to continue daily as tolerated and call us  with any questions   Patient agrees to discharge. Patient goals were not met. Patient is being discharged due to the patient's request.   Fumiye Lubben, PT 04/06/2024, 7:50 PM

## 2024-04-08 ENCOUNTER — Ambulatory Visit

## 2024-04-14 ENCOUNTER — Other Ambulatory Visit: Payer: Self-pay | Admitting: Family Medicine

## 2024-04-14 DIAGNOSIS — R1013 Epigastric pain: Secondary | ICD-10-CM

## 2024-04-19 ENCOUNTER — Encounter: Admitting: Rehabilitation

## 2024-04-21 ENCOUNTER — Encounter: Payer: Self-pay | Admitting: Physical Medicine and Rehabilitation

## 2024-04-21 ENCOUNTER — Encounter: Attending: Physical Medicine & Rehabilitation | Admitting: Physical Medicine and Rehabilitation

## 2024-04-21 VITALS — BP 118/87 | HR 92 | Ht 60.0 in | Wt 184.2 lb

## 2024-04-21 DIAGNOSIS — R7689 Other specified abnormal immunological findings in serum: Secondary | ICD-10-CM | POA: Diagnosis not present

## 2024-04-21 DIAGNOSIS — G479 Sleep disorder, unspecified: Secondary | ICD-10-CM | POA: Insufficient documentation

## 2024-04-21 DIAGNOSIS — M5441 Lumbago with sciatica, right side: Secondary | ICD-10-CM | POA: Diagnosis present

## 2024-04-21 DIAGNOSIS — M797 Fibromyalgia: Secondary | ICD-10-CM | POA: Insufficient documentation

## 2024-04-21 DIAGNOSIS — R0683 Snoring: Secondary | ICD-10-CM | POA: Insufficient documentation

## 2024-04-21 DIAGNOSIS — G8929 Other chronic pain: Secondary | ICD-10-CM | POA: Insufficient documentation

## 2024-04-21 DIAGNOSIS — M533 Sacrococcygeal disorders, not elsewhere classified: Secondary | ICD-10-CM | POA: Diagnosis present

## 2024-04-21 MED ORDER — DULOXETINE HCL 30 MG PO CPEP: 60.0000 mg | ORAL_CAPSULE | Freq: Every day | ORAL | 3 refills | Status: AC

## 2024-04-21 MED ORDER — PREGABALIN 100 MG PO CAPS: 100.0000 mg | ORAL_CAPSULE | Freq: Two times a day (BID) | ORAL | 5 refills | Status: AC

## 2024-04-21 NOTE — Patient Instructions (Addendum)
  VISIT SUMMARY: During your visit, we discussed your ongoing right low back and hip pain, fibromyalgia with chronic insomnia, and other related symptoms. We reviewed your current medications and planned adjustments to better manage your pain and sleep issues.  YOUR PLAN: FIBROMYALGIA WITH CHRONIC INSOMNIA: You have chronic fibromyalgia with fatigue and insomnia, which may be worsened by sleep disturbances. There is a possibility of sleep apnea due to your snoring and daytime sleepiness. -We will order a sleep study to evaluate for sleep apnea. -Increase duloxetine  to 60 mg daily after one week on increased lyrica  -Increase Lyrica  to 100 mg twice daily.  - Follow up with me in 3 months  RIGHT LOW BACK AND RIGHT HIP PAIN WITH RADICULAR SYMPTOMS: You have persistent right low back and hip pain with symptoms that radiate down your leg, likely due to issues in your lumbar spine. -We will refer you for an epidural steroid injection with Dr Carilyn -Monitor your response to the increased Lyrica  dosage.  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 -We will consider an MRI of your thoracic spine if your abdominal pain / symptoms  worsen or become hypersensitive  ELEVATED ANA - Follow up with rheumatology    Contains text generated by [employer].

## 2024-04-21 NOTE — Progress Notes (Signed)
 Subjective:    Patient ID: Lauren Soto, female    DOB: 1970-10-15, 53 y.o.   MRN: [id number]  HPI   [name] is a 53 y.o. year old female  who  has a past medical history of Acute cystitis (02/03/2023), AKI (acute kidney injury) (02/02/2023), Asthma (04/21/2014), Chronic right hip pain (11/06/2022), Diabetes mellitus without complication (HCC), Essential hypertension (05/25/2012), GERD (gastroesophageal reflux disease) (02/03/2023), Hyperlipidemia (11/06/2022), Hypertension, Long term current use of oral hypoglycemic drug (11/06/2022), Menorrhagia (06/08/2012), Non-insulin  dependent type 2 diabetes mellitus (HCC) (06/18/2018), Pelvic pain in female (06/08/2012), Postural dizziness with presyncope (02/03/2023), Severe sepsis (HCC) (02/03/2023), Uterine fibroid (05/25/2012), Vertigo, and Vestibular migraine (07/06/2018).   They are presenting to PM&R clinic for follow up related to  Fibromyalgia and R hip/low back pain .   Plan from last visit:  Chronic right hip pain -     Ambulatory referral to Physical Therapy   Continue avoiding meloxicam  given recent gastrointestinal symptoms.  Should be okay to continue Voltaren gel.   Continue work restrictions recommending completing stationary test while seated if able or standing allow seated rest breaks for at least 20 minutes every 3-4 hours.   Follow-up in 3 months.   Chronic right SI joint pain -     Ambulatory referral to Physical Therapy   Will not recommend further injections, as these were not helpful.  I am sending the patient to physical therapy to work on strengthening, pain control, and standing tolerance.   Fibromyalgia -     Ambulatory referral to Physical Therapy   Continue Lyrica  75 mg twice daily; we can address increasing this again at future visits if needed   I am starting duloxetine  30 mg capsules.  Start with 1 capsule daily for 2 weeks; then if no side effects increase to 2 capsules daily.  I  expect it to take 4 to 6 weeks before you see results from this medication.   Dysuria -     Urinalysis, Routine w reflex microscopic -     Urine Culture   Advised for abdominal pain, given distribution and description, may be secondary to gas.  Can try Maalox over-the-counter if this recurs.  Patient also endorses dysuria; sending her for testing today and will message through MyChart regarding results.   ** UA negative, Ucx with some yeast     Interval Hx:  Discussed the use of AI scribe software for clinical note transcription with the patient, who gave verbal consent to proceed.  History of Present Illness   Lauren Soto is a 53 year old female with fibromyalgia who presents for follow-up of right low back and hip pain.  She experiences persistent right low back and hip pain, described as burning and radiating, which worsens with physical activity, especially at work. Despite compliance with outpatient physical therapy, her response has been slow. Previous epidural steroid injections were not significantly helpful. She experiences weakness in the right leg, necessitating the use of a walking cane for stability, particularly at night.  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 A recent CT of the abdomen and pelvis previously showed severe multilevel degenerative disc disease and bridging osteophytes throughout the thoracic spine.  She is currently on duloxetine  and Lyrica , although she reports issues with pharmacy refills.  She experiences chronic fatigue and sleeps about four to five hours per night, with no trouble falling asleep but waking up after a few hours. She snores but has not had a sleep study done. No significant changes since the last visit. No  falls or giving out of the leg.         Pain Inventory Average Pain 10 Pain Right Now 9 My pain is constant, sharp, burning, dull, stabbing, tingling, and aching  In the last 24 hours, has pain interfered with the following? General activity 9 Relation with others 9 Enjoyment of life 9 What TIME of day is your pain at its worst? morning , daytime, and night Sleep (in general) Poor  Pain is worse with: walking, bending, sitting, standing, and some activites Pain improves with: rest and heat/ice Relief from Meds: no answer  Family History  Problem Relation Age of Onset   Neuropathy Mother    Dementia Mother    Hypertension Mother    Multiple myeloma Mother    Arthritis Mother    Dementia Father    Hypertension Father    Social History   Socioeconomic History   Marital status: Married    Spouse name: Not on file   Number of children: Not on file   Years of education: Not on file   Highest education level: Not on file  Occupational History   Not on file  Tobacco Use   Smoking status: Former    Current packs/day: 0.00    Types: Cigarettes    Quit date: 2009    Years since quitting: 16.8   Smokeless tobacco: Never   Tobacco comments:    Every once in awhile will vape  Vaping Use   Vaping status: Some Days   Last attempt to quit: 07/04/2022  Substance and Sexual Activity   Alcohol use: No   Drug use: No   Sexual activity: Yes    Birth control/protection: Surgical  Other Topics Concern   Not on file  Social History Narrative   Are you right handed or left handed? Right   Are you currently employed ? NO   What is your current occupation?   Do you live at home alone?   Who lives with you?    What type of home do you live in: 1 story or 2 story? 1       Social Drivers of Corporate investment banker Strain: Not on file  Food Insecurity: No Food Insecurity (02/03/2023)   Hunger Vital Sign    Worried About Running Out of Food in the Last Year: Never true     Ran Out of Food in the Last Year: Never true  Transportation Needs: No Transportation Needs (02/03/2023)   PRAPARE - Administrator, Civil Service (Medical): No    Lack of Transportation (Non-Medical): No  Physical Activity: Not on file  Stress: Not on file  Social Connections: Not on file   Past Surgical History:  Procedure Laterality Date   ABDOMINAL HYSTERECTOMY     BUNIONECTOMY     CHOLECYSTECTOMY     HERNIA REPAIR     umblical   SHOULDER ARTHROSCOPY     Past Surgical History:  Procedure Laterality Date   ABDOMINAL HYSTERECTOMY     BUNIONECTOMY     CHOLECYSTECTOMY     HERNIA REPAIR     umblical   SHOULDER ARTHROSCOPY     Past Medical History:  Diagnosis Date   Acute cystitis 02/03/2023  AKI (acute kidney injury) 02/02/2023   Asthma 04/21/2014   Chronic right hip pain 11/06/2022   Diabetes mellitus without complication (HCC)    Essential hypertension 05/25/2012   GERD (gastroesophageal reflux disease) 02/03/2023   Hyperlipidemia 11/06/2022   Hypertension    Long term current use of oral hypoglycemic drug 11/06/2022   Menorrhagia 06/08/2012   Non-insulin  dependent type 2 diabetes mellitus (HCC) 06/18/2018   Pelvic pain in female 06/08/2012   Postural dizziness with presyncope 02/03/2023   Severe sepsis (HCC) 02/03/2023   Uterine fibroid 05/25/2012   Vertigo    Vestibular migraine 07/06/2018   BP 118/87 (BP Location: Left Arm, Patient Position: Sitting, Cuff Size: Large)   Pulse 92   Ht 5' (1.524 m)   Wt 184 lb 3.2 oz (83.6 kg)   LMP 06/04/2012   SpO2 99%   BMI 35.97 kg/m   Opioid Risk Score:   Fall Risk Score:  `1  Depression screen [facility] 2/9     03/22/2024   11:27 AM 09/10/2023    3:25 PM 03/28/2023    8:55 AM 11/06/2022    3:41 PM  Depression screen PHQ 2/9  Decreased Interest 1 1 2 1   Down, Depressed, Hopeless 0 1 1 1   PHQ - 2 Score 1 2 3 2   Altered sleeping 1 3 3 2   Tired, decreased energy 2 3 3 2   Change in appetite 1 1 1 2   Feeling  bad or failure about yourself  0 0 0 0  Trouble concentrating 2 2 0 0  Moving slowly or fidgety/restless 0 1 2 0  Suicidal thoughts 0 0 0 0  PHQ-9 Score 7 12 12 8   Difficult doing work/chores Very difficult  Somewhat difficult Somewhat difficult      Review of Systems  Musculoskeletal:  Positive for back pain and myalgias.       Bilateral hip pain, right thigh pain, left knee pain, sciatic pain  All other systems reviewed and are negative.      Objective:   Physical Exam   PE: Constitution: Appropriate appearance for age. No apparent distress +Obese Resp: No respiratory distress. No accessory muscle usage. on RA Cardio: Well perfused appearance. No peripheral edema. Abdomen: Nondistended. Nontender.   Psych: Appropriate mood and affect. Neuro: AAOx4. No apparent cognitive deficits    Neurologic Exam:   DTRs: Reflexes were 2+ in bilateral achilles, patella, biceps, BR and triceps. Babinsky: flexor responses b/l.   Hoffmans: negative b/l Sensory exam: revealed normal sensation in all dermatomal regions in bilateral lower extremities and with reduced sensation to light touch in right low back, lateral hip, and right lateral malleolus Coordination: Fine motor coordination was normal.   Gait: normal; slightly stiff R leg       MSK: + Facet loading + TTP facets  - Slump  - SLR  There was pain at R SI joint and R PSIS; bilateral lumbar paraspinals.      Assessment & Plan:   ANNETH BRUNELL is a 53 y.o. year old female  who  has a past medical history of Acute cystitis (02/03/2023), AKI (acute kidney injury) (02/02/2023), Asthma (04/21/2014), Chronic right hip pain (11/06/2022), Diabetes mellitus without complication (HCC), Essential hypertension (05/25/2012), GERD (gastroesophageal reflux disease) (02/03/2023), Hyperlipidemia (11/06/2022), Hypertension, Long term current use of oral hypoglycemic drug (11/06/2022), Menorrhagia (06/08/2012), Non-insulin  dependent type 2  diabetes mellitus (HCC) (06/18/2018), Pelvic pain in female (06/08/2012), Postural dizziness with presyncope (02/03/2023), Severe sepsis (HCC) (02/03/2023), Uterine fibroid (05/25/2012), Vertigo, and  Vestibular migraine (07/06/2018).  They are presenting to PM&R clinic for follow up related to  Fibromyalgia and R low back pain with R sciatica, R hip pain, now swelling in the feet .   Assessment and Plan   FIBROMYALGIA WITH CHRONIC INSOMNIA: You have chronic fibromyalgia with fatigue and insomnia, which may be worsened by sleep disturbances. There is a possibility of sleep apnea due to your snoring and daytime sleepiness. -We will order a sleep study to evaluate for sleep apnea. -Increase duloxetine  to 60 mg daily after one week on increased lyrica  -Increase Lyrica  to 100 mg twice daily. - Follow up with me in 3 months - Discussed with patient process for SSD application today; offered to write letters of support if needed. Workplace NOT compliant with accommodations.   RIGHT LOW BACK PAIN WITH RADICULAR SYMPTOMS:  MRI lumbar spine 11/01/22 IMPRESSION: 1. Subtle anterolisthesis at L4-L5 with at least moderate facet arthropathy. No spinal stenosis but mild lateral recess and left foraminal stenosis at that level. 2. L5-S1 disc degeneration with a small rightward disc herniation most affecting the right lateral recess. Query right S1 radiculitis. 3. Partially visible lower thoracic disc degeneration with up to mild lower thoracic spinal stenosis. Visible lower spinal cord at those levels seems to remain normal.  - EMG RLE 07/18/23 normal; no L comparison. Has had R S1 transforaminal and R L5-S1 paramedian ESI with Dr. Georgina in 2024, without improvement  -We will refer you for an epidural steroid injection with Dr Carilyn; will try one level up L4-5 paramedian  - if no improvement will not repeat these  - Will not repeat SI joint injections due to no effect  -Monitor your response to the  increased Lyrica  dosage.  DEGENERATIVE DISC DISEASE OF THORACIC SPINE: Seen on review CT A/P, with significant disc desiccation and bridging anterior osteophytes  -We will consider an MRI of your thoracic spine if your abdominal pain / symptoms  worsen or become hypersensitive  ELEVATED ANA - Follow up with rheumatology  RIGHT HIP PAIN with partial hamstring tears and bilateral OA MRI pelvis 10/2022: Moderate tendinosis with partial tearing of the proximal hamstring tendons bilaterally. Mild tendinosis of the distal gluteus minimus tendons bilaterally with associated peritrochanteric edema, left greater than right. Mild osteoarthritis of the hips.   - Failed PT due to R radicular symptoms; see plan as above - Will try one more ESI and, if no improvement, send back to [facility] (last time indicated only surgical candidate if PT failed given partial tears) - Failed GTB injection

## 2024-05-03 ENCOUNTER — Encounter: Payer: Self-pay | Admitting: Radiology

## 2024-06-08 ENCOUNTER — Encounter: Payer: Self-pay | Admitting: Neurology

## 2024-06-08 ENCOUNTER — Ambulatory Visit: Admitting: Neurology

## 2024-06-08 VITALS — BP 144/92 | HR 86 | Ht 60.0 in | Wt 185.8 lb

## 2024-06-08 DIAGNOSIS — G4719 Other hypersomnia: Secondary | ICD-10-CM

## 2024-06-08 DIAGNOSIS — R519 Headache, unspecified: Secondary | ICD-10-CM

## 2024-06-08 DIAGNOSIS — R351 Nocturia: Secondary | ICD-10-CM

## 2024-06-08 DIAGNOSIS — R0683 Snoring: Secondary | ICD-10-CM

## 2024-06-08 DIAGNOSIS — E669 Obesity, unspecified: Secondary | ICD-10-CM

## 2024-06-08 DIAGNOSIS — Z9189 Other specified personal risk factors, not elsewhere classified: Secondary | ICD-10-CM

## 2024-06-08 NOTE — Patient Instructions (Signed)

## 2024-06-08 NOTE — Progress Notes (Signed)
 Subjective:    Patient ID: Lauren Soto is a 52 y.o. female.  HPI    True Mar, MD, PhD [facility] [address] [address] [place], [place] [address]  Dear Dr. Emeline,   I saw your patient, Lauren Soto, upon your kind request in my sleep clinic today for initial consultation of her sleep disorder, in particular, concern for underlying obstructive sleep apnea.  The patient is accompanied by her daughter today.  As you know, Lauren Soto is a 53 year old female with an underlying medical history of chronic hip pain, low back pain, fibromyalgia, asthma, diabetes, hypertension, hyperlipidemia, reflux disease, history of sepsis, dizziness, vertigo, migraine headaches (followed by [facility] neurology), uterine fibroid, status post abdominal hysterectomy, cholecystectomy, hernia repair, and shoulder arthroscopic surgery and history of obesity, who reports snoring and excessive daytime somnolence.  Her Epworth sleepiness score is 22 out of 24 today, fatigue severity score is 63 out of 63.  I reviewed your office note from 04/21/2024.  Of note, she is on multiple medications including potentially sedating medications including Flexeril , Cymbalta , and Lyrica . She also takes Qulipta  daily for migraine prevention and Imitrex  as needed.  She lives with her family including husband and daughter.  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  Bedtime is generally between 9 and 10 and rise time around 7.  She sleeps with the TV on in her bedroom.  They have 1 dog and 1 cat in the household.  She has nocturia about 2-3 times per average night.  She has woken up occasionally with a headache.  She is working on weight loss.  She drinks caffeine in the form of tea and coffee, about 1 or 2 servings per day on average.  She drinks alcohol rarely.  She quit smoking some 15 or 20 years ago, she vapes occasionally.   Her Past Medical History  Is Significant For: Past Medical History:  Diagnosis Date   Acute cystitis 02/03/2023   AKI (acute kidney injury) 02/02/2023   Asthma 04/21/2014   Chronic right hip pain 11/06/2022   Diabetes mellitus without complication (HCC)    Essential hypertension 05/25/2012   GERD (gastroesophageal reflux disease) 02/03/2023   Hyperlipidemia 11/06/2022   Hypertension    Long term current use of oral hypoglycemic drug 11/06/2022   Menorrhagia 06/08/2012   Non-insulin  dependent type 2 diabetes mellitus (HCC) 06/18/2018   Pelvic pain in female 06/08/2012   Postural dizziness with presyncope 02/03/2023   Severe sepsis (HCC) 02/03/2023   Uterine fibroid 05/25/2012   Vertigo    Vestibular migraine 07/06/2018    Her Past Surgical History Is Significant For: Past Surgical History:  Procedure Laterality Date   ABDOMINAL HYSTERECTOMY     BUNIONECTOMY     CHOLECYSTECTOMY     HERNIA REPAIR     umblical   SHOULDER ARTHROSCOPY      Her Family History Is Significant For: Family History  Problem Relation Age of Onset   Neuropathy Mother    Dementia Mother    Hypertension Mother    Multiple myeloma Mother    Arthritis Mother    Dementia Father    Hypertension Father    Sleep apnea Cousin     Her Social History Is Significant For: Social History   Socioeconomic History   Marital status: Married    Spouse name: Not on file   Number of children: Not on file   Years of education: Not on file  Highest education level: Not on file  Occupational History   Not on file  Tobacco Use   Smoking status: Former    Current packs/day: 0.00    Types: Cigarettes    Quit date: 2009    Years since quitting: 16.9   Smokeless tobacco: Never   Tobacco comments:    Every once in awhile will vape  Vaping Use   Vaping status: Some Days   Last attempt to quit: 07/04/2022  Substance and Sexual Activity   Alcohol use: Yes    Comment: Seldom  glass of wine   Drug use: No   Sexual activity: Yes     Birth control/protection: Surgical  Other Topics Concern   Not on file  Social History Narrative   Are you right handed or left handed? Right   Are you currently employed ? NO   Pt works    Pt lives with family    What type of home do you live in: 1 story or 2 story? 1       Social Drivers of Corporate Investment Banker Strain: Not on file  Food Insecurity: No Food Insecurity (02/03/2023)   Hunger Vital Sign    Worried About Running Out of Food in the Last Year: Never true    Ran Out of Food in the Last Year: Never true  Transportation Needs: No Transportation Needs (02/03/2023)   PRAPARE - Administrator, Civil Service (Medical): No    Lack of Transportation (Non-Medical): No  Physical Activity: Not on file  Stress: Not on file  Social Connections: Not on file    Her Allergies Are:  No Known Allergies:   Her Current Medications Are:  Outpatient Encounter Medications as of 06/08/2024  Medication Sig   Atogepant  (QULIPTA ) 60 MG TABS Take 1 tablet (60 mg total) by mouth daily.   atorvastatin  (LIPITOR) 20 MG tablet Take 1 tablet (20 mg total) by mouth daily.   Blood Glucose Monitoring Suppl DEVI 1 each by Does not apply route in the morning, at noon, and at bedtime. May substitute to any manufacturer covered by patient's insurance.   cyclobenzaprine  (FLEXERIL ) 10 MG tablet Take 1 tablet by mouth three times daily as needed for muscle spasm   diclofenac Sodium (VOLTAREN) 1 % GEL Apply 2 g topically 4 (four) times daily as needed (pain).   dicyclomine  (BENTYL ) 20 MG tablet Take 0.5 tablets (10 mg total) by mouth 2 (two) times daily as needed for spasms.   DULoxetine  (CYMBALTA ) 30 MG capsule Take 2 capsules (60 mg total) by mouth daily. Start with 30 mg daily for 2 weeks, then increase to 60 mg ( 2 capsules) daily.   famotidine  (PEPCID ) 20 MG tablet Take 1 tablet by mouth twice daily   hydrochlorothiazide  (HYDRODIURIL ) 12.5 MG tablet Take 1 tablet (12.5 mg total) by mouth  daily.   Insulin  Pen Needle (NOVOFINE PEN NEEDLE) 32G X 6 MM MISC 1 Device by Does not apply route daily. To use with Victoza    liraglutide  (VICTOZA ) 18 MG/3ML SOPN Start 0.6mg  SQ once a day for 7 days, then increase to 1.2mg  once a day   lisinopril  (ZESTRIL ) 40 MG tablet Take 1 tablet (40 mg total) by mouth daily.   meclizine  (ANTIVERT ) 25 MG tablet Take 1 tablet (25 mg total) by mouth 3 (three) times daily as needed for dizziness.   metFORMIN  (GLUCOPHAGE ) 850 MG tablet Take 1 tablet (850 mg total) by mouth 2 (two) times daily.  metoprolol  succinate (TOPROL -XL) 50 MG 24 hr tablet Take 1 tablet (50 mg total) by mouth daily. Take with or immediately following a meal.   nitroGLYCERIN  (NITROSTAT ) 0.4 MG SL tablet Place 1 tablet (0.4 mg total) under the tongue every 5 (five) minutes as needed for chest pain.   ondansetron  (ZOFRAN -ODT) 4 MG disintegrating tablet Take 1 tablet (4 mg total) by mouth every 8 (eight) hours as needed.   pantoprazole  (PROTONIX ) 20 MG tablet Take 1 tablet (20 mg total) by mouth daily.   pregabalin  (LYRICA ) 100 MG capsule Take 1 capsule (100 mg total) by mouth 2 (two) times daily.   SUMAtriptan  (IMITREX ) 100 MG tablet Take 1 tablet (100 mg total) by mouth as needed for migraine. May repeat in 2 hours if headache persists or recurs.  Maximum 2 tablets in 24 hours.   urea  (CARMOL) 10 % cream Apply topically as needed.   No facility-administered encounter medications on file as of 06/08/2024.  :   Review of Systems:  Out of a complete 14 point review of systems, all are reviewed and negative with the exception of these symptoms as listed below:  Review of Systems  Objective:  Neurological Exam  Physical Exam Physical Examination:   Vitals:   06/08/24 1520  BP: (!) 144/92  Pulse: 86    General Examination: The patient is a very pleasant 53 y.o. female in no acute distress. She appears well-developed and well-nourished and well groomed.   HEENT: Normocephalic,  atraumatic, pupils are equal, round and reactive to light, extraocular tracking is good without limitation to gaze excursion or nystagmus noted. No photophobia.  Corrective eye glasses in place. Hearing is grossly intact.  Face is symmetric with normal facial animation. Speech is clear without dysarthria. There is no hypophonia. There is no lip, neck/head, jaw or voice tremor. Neck is supple with full range of passive and active motion. There are no carotid bruits on auscultation.  Airway/Oropharynx exam reveals: mild mouth dryness, adequate dental hygiene with several missing teeth on top, does not typically wear her partial dentures, moderate airway crowding secondary to prominent uvula, small airway entry, Mallampati class IV, tonsils on the smaller side, tip of uvula not fully visualized.  Tongue protrudes centrally and palate elevates symmetrically.  Neck circumference 14 1/8 inches.  Minimal to mild overbite noted.   Chest: Clear to auscultation without wheezing, rhonchi or crackles noted.  Heart: S1+S2+0, regular and normal without murmurs, rubs or gallops noted.   Abdomen: Soft, non-tender and non-distended.  Extremities: There is no pitting edema in the distal lower extremities bilaterally.   Skin: Warm and dry without trophic changes noted.   Musculoskeletal: exam reveals no obvious joint deformities.   Neurologically:  Mental status: The patient is awake, alert and oriented in all 4 spheres. Her immediate and remote memory, attention, language skills and fund of knowledge are appropriate. There is no evidence of aphasia, agnosia, apraxia or anomia. Speech is clear with normal prosody and enunciation. Thought process is linear. Mood is normal and affect is normal.  Cranial nerves II - XII are as described above under HEENT exam.  Motor exam: Normal bulk, moving all 4 extremities without obvious restriction, no obvious action or resting tremor.  Fine motor skills and coordination: Intact  grossly.  Cerebellar testing: No dysmetria or intention tremor. There is no truncal or gait ataxia.  Sensory exam: intact to light touch in the upper and lower extremities.  Gait, station and balance: She stands easily. No veering  to one side is noted. No leaning to one side is noted. Posture is age-appropriate and stance is narrow based. Gait shows normal stride length and normal pace. No problems turning are noted.   Assessment and Plan:  In summary, Lauren Soto is a very pleasant 53 y.o.-year old female with an underlying medical history of chronic hip pain, low back pain, fibromyalgia, asthma, diabetes, hypertension, hyperlipidemia, reflux disease, history of sepsis, dizziness, vertigo, migraine headaches (followed by [facility] neurology), uterine fibroid, status post abdominal hysterectomy, cholecystectomy, hernia repair, and shoulder arthroscopic surgery and history of obesity, whose history and physical exam are concerning for sleep disordered breathing, particularly obstructive sleep apnea (OSA). A laboratory attended sleep study is typically considered gold standard for evaluation of sleep disordered breathing.   I had a long chat with the patient about my findings and the diagnosis of sleep apnea, particularly OSA, its prognosis and treatment options. We talked about medical/conservative treatments, surgical interventions and non-pharmacological approaches for symptom control. I explained, in particular, the risks and ramifications of untreated moderate to severe OSA, especially with respect to developing cardiovascular disease down the road, including congestive heart failure (CHF), difficult to treat hypertension, cardiac arrhythmias (particularly A-fib), neurovascular complications including TIA, stroke and dementia. Even type 2 diabetes has, in part, been linked to untreated OSA. Symptoms of untreated OSA may include (but may not be limited to) daytime sleepiness, nocturia (i.e.  frequent nighttime urination), memory problems, mood irritability and suboptimally controlled or worsening mood disorder such as depression and/or anxiety, lack of energy, lack of motivation, physical discomfort, as well as recurrent headaches, especially morning or nocturnal headaches. We talked about the importance of maintaining a healthy lifestyle and striving for healthy weight.  In addition, we talked about the importance of striving for and maintaining good sleep hygiene. I recommended a sleep study at this time. I outlined the differences between a laboratory attended sleep study which is considered more comprehensive and accurate over the option of a home sleep test (HST); the latter may lead to underestimation of sleep disordered breathing in some instances and does not help with diagnosing upper airway resistance syndrome and is not accurate enough to diagnose primary central sleep apnea typically. I outlined possible surgical and non-surgical treatment options of OSA, including the use of a positive airway pressure (PAP) device (i.e. CPAP, AutoPAP/APAP or BiPAP in certain circumstances), a custom-made dental device (aka oral appliance, which would require a referral to a specialist dentist or orthodontist typically, and is generally speaking not considered for patients with full dentures or edentulous state), upper airway surgical options, such as traditional UPPP (which is not considered a first-line treatment) or the Inspire device (hypoglossal nerve stimulator, which would involve a referral for consultation with an ENT surgeon, after careful selection, following inclusion criteria - also not first-line treatment). I explained the PAP treatment option to the patient in detail, as this is generally considered first-line treatment.   We will pick up our discussion about the next steps and treatment options after testing.  We will keep her posted as to the test results by phone call and/or MyChart  messaging where possible.  We will plan to follow-up in sleep clinic accordingly as well.  I answered all her questions today and the patient was in agreement.   I encouraged her to call with any interim questions, concerns, problems or updates or email us  through MyChart.  Generally speaking, sleep test authorizations may take up to 2 weeks, sometimes less, sometimes  longer, the patient is encouraged to get in touch with us  if they do not hear back from the sleep lab staff directly within the next 2 weeks.  Thank you very much for allowing me to participate in the care of this nice patient. If I can be of any further assistance to you please do not hesitate to call me at [phone].  Sincerely,   True Mar, MD, PhD

## 2024-06-15 NOTE — Progress Notes (Deleted)
°  PROCEDURE RECORD [facility]   Name: Lauren Soto DOB:1970-09-24 MRN: [id number]  Date:06/15/2024  Physician:     Nurse/CMA: ***  Allergies: Allergies[1]  Consent Signed: {yes wn:685467}  Is patient diabetic? {yes no:314532}  CBG today? ***  Pregnant: {yes no:314532} LMP: Patient's last menstrual period was 06/04/2012. (age [age])  Anticoagulants: {Yes/No:19989} Anti-inflammatory: {Yes/No:19989} Antibiotics: {Yes/No:19989}  Procedure: ***  Position: Prone Start Time: ***  End Time: ***  Fluoro Time: ***  RN/CMA  Micca Matura MA    Time      BP      Pulse      Respirations      O2 Sat      S/S      Pain Level       D/C home with ***, patient A & O X 3, D/C instructions reviewed, and sits independently.           [1] No Known Allergies

## 2024-06-18 ENCOUNTER — Encounter: Attending: Physical Medicine & Rehabilitation | Admitting: Physical Medicine & Rehabilitation

## 2024-06-18 ENCOUNTER — Ambulatory Visit (INDEPENDENT_AMBULATORY_CARE_PROVIDER_SITE_OTHER): Admitting: Neurology

## 2024-06-18 DIAGNOSIS — R519 Headache, unspecified: Secondary | ICD-10-CM

## 2024-06-18 DIAGNOSIS — R0683 Snoring: Secondary | ICD-10-CM

## 2024-06-18 DIAGNOSIS — G4734 Idiopathic sleep related nonobstructive alveolar hypoventilation: Secondary | ICD-10-CM

## 2024-06-18 DIAGNOSIS — E669 Obesity, unspecified: Secondary | ICD-10-CM

## 2024-06-18 DIAGNOSIS — G4719 Other hypersomnia: Secondary | ICD-10-CM

## 2024-06-18 DIAGNOSIS — G4733 Obstructive sleep apnea (adult) (pediatric): Secondary | ICD-10-CM

## 2024-06-18 DIAGNOSIS — Z9189 Other specified personal risk factors, not elsewhere classified: Secondary | ICD-10-CM

## 2024-06-18 DIAGNOSIS — R351 Nocturia: Secondary | ICD-10-CM
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
## 2024-06-21 ENCOUNTER — Ambulatory Visit: Admitting: Neurology
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
## 2024-06-23 ENCOUNTER — Encounter: Payer: Self-pay | Admitting: Neurology

## 2024-06-23 ENCOUNTER — Ambulatory Visit (INDEPENDENT_AMBULATORY_CARE_PROVIDER_SITE_OTHER): Admitting: Neurology

## 2024-06-23 VITALS — BP 129/88 | HR 82 | Ht 60.0 in | Wt 182.2 lb

## 2024-06-23 DIAGNOSIS — G43119 Migraine with aura, intractable, without status migrainosus: Secondary | ICD-10-CM | POA: Diagnosis not present

## 2024-06-23 DIAGNOSIS — G43809 Other migraine, not intractable, without status migrainosus: Secondary | ICD-10-CM

## 2024-06-23 MED ORDER — ONDANSETRON 4 MG PO TBDP: 4.0000 mg | ORAL_TABLET | Freq: Three times a day (TID) | ORAL | 5 refills | Status: AC | PRN

## 2024-06-23 MED ORDER — MECLIZINE HCL 25 MG PO TABS: 25.0000 mg | ORAL_TABLET | Freq: Three times a day (TID) | ORAL | 5 refills | Status: AC | PRN

## 2024-06-23 MED ORDER — QULIPTA 60 MG PO TABS: 60.0000 mg | ORAL_TABLET | Freq: Every day | ORAL | 11 refills | Status: AC

## 2024-06-23 MED ORDER — SUMATRIPTAN SUCCINATE 100 MG PO TABS: 100.0000 mg | ORAL_TABLET | ORAL | 5 refills | Status: AC | PRN

## 2024-06-23 NOTE — Progress Notes (Signed)
 Medication Samples have been provided to the patient.  Drug name: Nurtec       Strength: 75 mg        Qty: 2  LOT: [id number]  Exp.Date: 8/28  Dosing instructions: as needed  The patient has been instructed regarding the correct time, dose, and frequency of taking this medication, including desired effects and most common side effects.   Lauren Soto 10:06 AM 06/23/2024

## 2024-06-23 NOTE — Patient Instructions (Signed)
 Continue Qulipta  60mg  daily At earliest onset of migraine, take Nurtec (maximum 1 in 24 hours).  Give me update.  Continue meclizine  and Zofran  as needed Refer to physical therapy for vestibular rehab/vertigo Follow up 6 months.

## 2024-06-29 ENCOUNTER — Ambulatory Visit: Admitting: Neurology

## 2024-06-30 NOTE — Progress Notes (Signed)
 See procedure note.

## 2024-07-05 ENCOUNTER — Ambulatory Visit: Payer: Self-pay | Admitting: Neurology

## 2024-07-05 DIAGNOSIS — G4733 Obstructive sleep apnea (adult) (pediatric): Secondary | ICD-10-CM

## 2024-07-05 DIAGNOSIS — G4719 Other hypersomnia: Secondary | ICD-10-CM

## 2024-07-05 NOTE — Procedures (Signed)
 "   [facility]  HOME SLEEP TEST (SANSA) REPORT (Mail-Out Device):   STUDY DATE: 06/22/2024  DOB: 1970-08-15  MRN: [id number]  ORDERING CLINICIAN: True Mar, MD, PhD   REFERRING CLINICIAN: Dr. Joesph Likes  CLINICAL INFORMATION/HISTORY (obtained from visit note dated 06/08/2024): 54 year old female with an underlying medical history of chronic hip pain, low back pain, fibromyalgia, asthma, diabetes, hypertension, hyperlipidemia, reflux disease, history of sepsis, dizziness, vertigo, migraine headaches (followed by [facility] neurology), uterine fibroid, status post abdominal hysterectomy, cholecystectomy, hernia repair, and shoulder arthroscopic surgery and history of obesity, who reports snoring and excessive daytime somnolence.    PATIENT'S LAST REPORTED EPWORTH SLEEPINESS SCORE (ESS): 22/24.  BMI (at the time of sleep clinic visit and/or test date): 36.3 kg/m  FINDINGS:   Study Protocol:    The SANSA single-point-of-skin-contact chest-worn sensor - an FDA cleared and DOT approved type 4 home sleep test device - measures eight physiological channels,  including blood oxygen saturation (measured via PPG [photoplethysmography]), EKG-derived heart rate, respiratory effort, chest movement (measured via accelerometer), snoring, body position, and actigraphy. The device is designed to be worn for up to 10 hours per study.   Sleep Summary:   Total Recording Time (hours, min): 8 hours, 25 min  Total Effective Sleep Time (hours, min):  6 hours, 28 min  Sleep Efficiency (%):    77%   Respiratory Indices:   Calculated sAHI (per hour):  16.2/hour        Calculated sAHIc (central AHI per hour):  0.4/hour  Oxygen Saturation Statistics:    Oxygen Saturation (%) Mean: 93.4%   Minimum oxygen saturation (%):                 75.2%   O2 Saturation Range (%): 75.2-98.6%   Time below or at 88% saturation: 15 min   Pulse Rate Statistics:   Pulse Mean (bpm):     75/min    Pulse Range (63-98/min)   Snoring: Mild to moderate  IMPRESSION/DIAGNOSES:   OSA (obstructive sleep apnea), moderate Nocturnal Hypoxemia  RECOMMENDATIONS:   This home sleep test demonstrates moderate obstructive sleep apnea with a total AHI of 16.2/hour and O2 nadir of 75.2%.  Time below or at 88% saturation for the night was 15 minutes, indicating some degree of nocturnal hypoxemia.  Mild to moderate snoring was detected. Treatment with a positive airway pressure (PAP) device is recommended. The patient will be advised to proceed with an autoPAP titration/trial at home for now. A full night titration study may be considered to optimize treatment settings, monitor proper oxygen saturations and aid with improvement of tolerance and adherence, if needed down the road. Alternative treatment options may include a dental device through dentistry or orthodontics in selected patients or Inspire (hypoglossal nerve stimulator) in carefully selected patients (meeting inclusion criteria).  Concomitant weight loss is recommended (where clinically appropriate). Please note that untreated obstructive sleep apnea may carry additional perioperative morbidity. Patients with significant obstructive sleep apnea should receive perioperative PAP therapy and the surgeons and particularly the anesthesiologist should be informed of the diagnosis and the severity of the sleep disordered breathing. The patient should be cautioned not to drive, work at heights, or operate dangerous or heavy equipment when tired or sleepy. Review and reiteration of good sleep hygiene measures should be pursued with any patient. Other causes of the patient's symptoms, including circadian rhythm disturbances, an underlying mood disorder, medication effect and/or an underlying medical problem cannot be ruled out based on this test. Clinical  correlation is recommended.  The patient and her referring provider will be notified of the  test results. The patient will be seen in follow up in sleep clinic at [facility].  I certify that I have reviewed the raw data recording prior to the issuance of this report in accordance with the standards of the American Academy of Sleep Medicine (AASM).    INTERPRETING PHYSICIAN:   True Mar, MD, PhD Medical Director, [facility]) Diplomat, ABPN (Neurology and Sleep)   [facility] [address] [place], [place] [address] [phone]          "

## 2024-07-09 NOTE — Progress Notes (Signed)
 Patient is aware of results and is ready for next steps with PAP setup.  Forwarded information to [facility] for PAP set-up.

## 2024-07-10 NOTE — Therapy (Signed)
 " OUTPATIENT PHYSICAL THERAPY VESTIBULAR EVALUATION     Patient Name: Lauren Soto MRN: [id number] DOB:[birth year], 54 y.o., female Today's Date: 07/14/2024  END OF SESSION:  PT End of Session - 07/14/24 1615     Visit Number 1    Date for Recertification  09/08/24    Authorization Type [employer] plan & [employer] medicaid    PT Start Time [phone]    PT Stop Time 1700    PT Time Calculation (min) 46 min    Activity Tolerance Patient tolerated treatment well;No increased pain    Behavior During Therapy [facility] for tasks assessed/performed          Past Medical History:  Diagnosis Date   Acute cystitis 02/03/2023   AKI (acute kidney injury) 02/02/2023   Asthma 04/21/2014   Chronic right hip pain 11/06/2022   Diabetes mellitus without complication (HCC)    Essential hypertension 05/25/2012   GERD (gastroesophageal reflux disease) 02/03/2023   Hyperlipidemia 11/06/2022   Hypertension    Long term current use of oral hypoglycemic drug 11/06/2022   Menorrhagia 06/08/2012   Non-insulin  dependent type 2 diabetes mellitus (HCC) 06/18/2018   Pelvic pain in female 06/08/2012   Postural dizziness with presyncope 02/03/2023   Severe sepsis (HCC) 02/03/2023   Uterine fibroid 05/25/2012   Vertigo    Vestibular migraine 07/06/2018   Past Surgical History:  Procedure Laterality Date   ABDOMINAL HYSTERECTOMY     BUNIONECTOMY     CHOLECYSTECTOMY     HERNIA REPAIR     umblical   SHOULDER ARTHROSCOPY     Patient Active Problem List   Diagnosis Date Noted   Chronic bilateral low back pain with right-sided sciatica 04/21/2024   Elevated antinuclear antibody (ANA) level 04/21/2024   Snoring 04/21/2024   Sleep difficulties 04/21/2024   Diarrhea 03/30/2024   Dysuria 01/19/2024   Polyarthralgia 11/27/2023   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   Fibromyalgia 09/10/2023   Other insomnia 09/10/2023   Obesity (BMI 35.0-39.9 without comorbidity) 03/19/2023   Vertigo    Severe  sepsis (HCC) 02/03/2023   Postural dizziness with presyncope 02/03/2023   GERD (gastroesophageal reflux disease) 02/03/2023   Acute cystitis 02/03/2023   AKI (acute kidney injury) 02/02/2023   Long term current use of oral hypoglycemic drug 11/06/2022   Chronic right hip pain 11/06/2022   Hyperlipidemia 11/06/2022   Vestibular migraine 07/06/2018   Non-insulin  dependent type 2 diabetes mellitus (HCC) 06/18/2018   Asthma 04/21/2014   Menorrhagia 06/08/2012   Pelvic pain in female 06/08/2012   Essential hypertension 05/25/2012   Uterine fibroid 05/25/2012    PCP: Waddell Mon, NP REFERRING PROVIDER: Dr Skeet  REFERRING DIAG: [phone] (ICD-10-CM) - Vestibular migraine  THERAPY DIAG:  Dizziness and giddiness  Muscle weakness (generalized)  Abnormal posture  Vestibular migraine  ONSET DATE: several years ago  Rationale for Evaluation and Treatment: Rehabilitation  SUBJECTIVE:   SUBJECTIVE STATEMENT: 54 y/o patient referred to PT from Dr Skeet for vestibular migraines.  MD office note mentions positional vertigo with possible peripheral vertigo.  She is well known to us  from recent episode of care for LBP/hip pain. unfortunately we were unable to make any improvement in her symptoms, and she hope to get lumbar ESI soon.  Regarding the dizziness, She reports she has had it for several years now.  States the problem started after she took 2 fairly long airplane flights back to back.   States after she returned she began having weakness, dizziness and  was diagnosed with vertigo.   She went to ENT and was evaluated, treated and felt a little improvement.   However, the problems have been progressively worsening over recent months.  She takes meclizine  for the dizziness which doesn't help a lot.    She also has migraines for which she takes sumitriptan.  States she gets migraines several times weekly for no apparent reason and just has to lie down.   Dizziness occurs multiple times daily but  migraines do seem to follow it.   Bending over and returning to stand makes her feel like her head is swimming.  Getting up out bed provokes dizziness as well per her report.   She also c/o posterior neck pain.  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   She does have symptoms at work at times and has to sit down until they subside.   She states closing her eyes and lying down helps her migraines, but closing her eyes makes her dizziness worse.     Pt accompanied by: self  PERTINENT HISTORY: DM, fibromyalgia, chronic LBP,  chronic R hip pain, chronic SI joint pain, HTN  PAIN:  Are you having pain? Yes: NPRS scale: 0/10 at times;  8/10 worst Pain location: upper trap/lateral cervical spine area Pain description: aching, sore, tight Aggravating factors: worse as the day progresses Relieving factors: resting  PRECAUTIONS: Fall  RED FLAGS: None   WEIGHT BEARING RESTRICTIONS: No  FALLS: Has patient fallen in last 6 months? No  LIVING ENVIRONMENT: Lives with: lives with their family Lives in: House/apartment Stairs: see prior evaluations for this info Has following equipment at home: None  PLOF: Independent with gait  PATIENT GOALS: have less dizziness, less migraines, less neck pain  OBJECTIVE:  Note: Objective measures were completed at Evaluation unless otherwise noted.  DIAGNOSTIC FINDINGS: no recent head or neck imaging  COGNITION: Overall cognitive status: Within functional limits for tasks assessed   SENSATION: WFL  EDEMA:  Seems to have some edema in her arms  MUSCLE TONE:  WNL  DTRs:  NT  POSTURE:  rounded shoulders and forward head;  elevated shoulders with very tight appearing upper trapezius muscles; very pronounced upper thoracic kyphosis at T1-3  Cervical ROM:    Active A/PROM (deg) eval  Flexion 80%  Extension 75%  Right lateral flexion 60%  Left lateral flexion 60%  Right rotation 90%  Left rotation 80%   (Blank rows = not tested)  STRENGTH: equal BUE;  but has weakness with BUE shoulder ER, shoulder flexoin, shoulder HABD of about   UPPER EXTREMITY MMT:  MMT Right eval Left eval  Shoulder flexion 4- 4-  Shoulder extension 4- 4-  Shoulder abduction 4- 4-  Shoulder adduction    Shoulder extension    Shoulder internal rotation 4+ 4+  Shoulder external rotation 4 4  Middle trapezius 4- 4-  Lower trapezius    Elbow flexion 4+ 4+  Elbow extension 4+ 4+  Wrist flexion    Wrist extension    Wrist ulnar deviation    Wrist radial deviation    Wrist pronation    Wrist supination    Grip strength =BUE    (Blank rows = not tested)   BED MOBILITY:  Sit to supine Complete Independence Supine to sit Complete Independence Rolling to Right Complete Independence Rolling to Left Complete Independence  GAIT: Gait pattern: short steps, slower cadence, holds head very still with no rotation---turns her body if  she needs to turn to look at something Distance walked: 150' into clinic Assistive device utilized: None Level of assistance: Complete Independence Comments:   FUNCTIONAL TESTS:  TBD  PATIENT SURVEYS:  DHI: THE DIZZINESS HANDICAP INVENTORY (DHI)  P1. Does looking up increase your problem? 4 = Yes  E2. Because of your problem, do you feel frustrated? 2 = Sometimes  F3. Because of your problem, do you restrict your travel for business or recreation?  4 = Yes  P4. Does walking down the aisle of a supermarket increase your problems?  4 = Yes  F5. Because of your problem, do you have difficulty getting into or out of bed?  4 = Yes  F6. Does your problem significantly restrict your participation in social activities, such as going out to dinner, going to the movies, dancing, or going to parties? 4 = Yes  F7. Because of your problem, do you have difficulty reading?  4 = Yes  P8. Does performing more ambitious activities such as sports, dancing, household chores (sweeping or putting  dishes away) increase your problems?  4 = Yes  E9. Because of your problem, are you afraid to leave your home without having without having someone accompany you?  4 = Yes  E10. Because of your problem have you been embarrassed in front of others?  4 = Yes  P11. Do quick movements of your head increase your problem?  4 = Yes  F12. Because of your problem, do you avoid heights?  4 = Yes  P13. Does turning over in bed increase your problem?  4 = Yes  F14. Because of your problem, is it difficult for you to do strenuous homework or yard work? 4 = Yes  E15. Because of your problem, are you afraid people may think you are intoxicated? 4 = Yes  F16. Because of your problem, is it difficult for you to go for a walk by yourself?  4 = Yes  P17. Does walking down a sidewalk increase your problem?  4 = Yes  E18.Because of your problem, is it difficult for you to concentrate 4 = Yes  F19. Because of your problem, is it difficult for you to walk around your house in the dark? 4 = Yes  E20. Because of your problem, are you afraid to stay home alone?  4 = Yes  E21. Because of your problem, do you feel handicapped? 4 = Yes  E22. Has the problem placed stress on your relationships with members of your family or friends? 4 = Yes  E23. Because of your problem, are you depressed?  2 = Sometimes  F24. Does your problem interfere with your job or household responsibilities?  4 = Yes  P25. Does bending over increase your problem?  4 = Yes  TOTAL 96/100    DHI Scoring Instructions  The patient is asked to answer each question as it pertains to dizziness or unsteadiness problems, specifically  considering their condition during the last month. Questions are designed to incorporate functional (F), physical  (P), and emotional (E) impacts on disability.   Scores greater than 10 points should be referred to balance specialists for further evaluation.   16-34 Points (mild handicap)  36-52 Points (moderate handicap)   54+ Points (severe handicap)  Minimally Detectable Change: 17 points ([address] [place], 1990)  [place], G. SHAUNNA. and [place], C. W. (1990). The development of the Dizziness Handicap Inventory. Archives of Otolaryngology - Head and Neck Surgery 116(4): [id number].  VESTIBULAR ASSESSMENT:  GENERAL OBSERVATION:    SYMPTOM BEHAVIOR:  Subjective history: see subjective note above  Non-Vestibular symptoms: changes in vision, neck pain, headaches, nausea/vomiting, and migraine symptoms  Type of dizziness: Spinning/Vertigo, Unsteady with head/body turns, Lightheadedness/Faint, and Swimmyheaded  Frequency: multiple times daily with various movements  Duration: difficult for patient to tell, but states sometimes it takes a while to go away, not immediate resolution per her report  Aggravating factors: Induced by position change: sit to stand and turning around quickly  Relieving factors: head stationary, lying supine, rest, slow movements, and avoid busy/distracting environments  Progression of symptoms: unchanged  OCULOMOTOR EXAM:  Ocular Alignment: normal  Ocular ROM: No Limitations  Spontaneous Nystagmus: absent  Gaze-Induced Nystagmus: absent  Smooth Pursuits: intact  Saccades: intact  Convergence/Divergence: NT cm   Cover-cross-cover test: Normal  VESTIBULAR - OCULAR REFLEX:   Slow VOR: Positive Left  VOR Cancellation: Normal  Head-Impulse Test: HIT Right: negative HIT Left: negative  Dynamic Visual Acuity: nt   POSITIONAL TESTING: Right Dix-Hallpike: no nystagmus Left Dix-Hallpike: no nystagmus Right Roll Test: no nystagmus Left Roll Test: no nystagmus Right Sidelying: no nystagmus Left Sidelying: no nystagmus  MOTION SENSITIVITY:  Motion Sensitivity Quotient Intensity: 0 = none, 1 = Lightheaded, 2 = Mild, 3 = Moderate, 4 = Severe, 5 = Vomiting  Intensity  1. Sitting to supine   2. Supine to L side   3. Supine to R side   4. Supine to sitting   5. L Hallpike-Dix    6. Up from L    7. R Hallpike-Dix   8. Up from R    9. Sitting, head tipped to L knee   10. Head up from L knee   11. Sitting, head tipped to R knee   12. Head up from R knee   13. Sitting head turns x5   14.Sitting head nods x5   15. In stance, 180 turn to L    16. In stance, 180 turn to R     OTHOSTATICS: Sitting 140/95;   Standing 175/95 (messaged PCP this reading)  FUNCTIONAL GAIT: NT                                                                                                                             TREATMENT DATE:  07/14/24 SELF CARE: Provided education on PT POC progression; initial HEP  With instructions to start with seated gaze stabilization x 1 week, then progress to standing gaze stabilization   PATIENT EDUCATION: Education details: initial HEP Person educated: Patient Education method: Explanation, Demonstration, Tactile cues, Verbal cues, and Handouts Education comprehension: verbalized understanding, verbal cues required, tactile cues required, and needs further education  HOME EXERCISE PROGRAM: Access Code: [email or web address] URL: [email or web address] Date: 07/14/2024 Prepared by: Garnette Montclair  Exercises - Seated Gaze Stabilization with Head Rotation  - 1 x daily - 7 x weekly - 3 sets - 10 reps - Seated Gaze Stabilization with Head  Nod  - 1 x daily - 7 x weekly - 3 sets - 10 reps - Standing Gaze Stabilization with Head Rotation  - 1 x daily - 7 x weekly - 3 sets - 10 reps - Standing Gaze Stabilization with Head Nod  - 1 x daily - 7 x weekly - 3 sets - 10 reps - Seated Thoracic Self Mobilization  - 1 x daily - 7 x weekly - 1 sets - 10 reps - Standing Cervical Retraction  - 1 x daily - 7 x weekly - 1 sets - 10 reps - Seated Cervical Retraction and Extension  - 1 x daily - 7 x weekly - 1 sets - 10 reps   GOALS: Goals reviewed with patient? Yes  SHORT TERM GOALS: Target date: 08/11/2024   Patient will report 25-50% subjective  improvement in neck pain Baseline: 8/10 worst Goal status: INITIAL  2.  Patient will demonstrate improvement in cervical ROM to 100% all planes Baseline: see ROM tables above Goal status: INITIAL  3.  Patient will be independent with initial HEP Baseline: requires assist from PT for correct completion Goal status: INITIAL  4.  Patient will report 25-50% subjective improvement in symptoms of dizziness Baseline: multiple episodes daily Goal status: INITIAL   LONG TERM GOALS: Target date: 09/08/2024   Patient will report 75% improvement in cervical pain Baseline: 8/10 worst Goal status: INITIAL  2.  Patient will report 75% subjective improvement in symptoms of dizziness Baseline:  Goal status: INITIAL  3.  Patient will have 5/5 BUE strength for postural and scapular stability to decrease cervicogenic HA Baseline: see strength tables above Goal status: INITIAL  4.  Patient will improve to </= 70% on DHI scale for improved QOL Baseline: 96% Goal status: INITIAL    ASSESSMENT:  CLINICAL IMPRESSION: Patient is seen today for physical therapy evaluation and treatment for vestibular migraines at request of Dr Skeet.   She reports long h/o dizziness, migraines, and neck pain.   She attributes some of her dizziness to positional changes like bending over or getting up from sitting.   However, she does not exhibit any symptoms of vertigo with positional testing today.   Hallpike-Dix, turning head S/S in supine, and roll testing are all negative without any observable nystagmus.   Her BP is elevated though at 175/95.  I have informed her PCP of this reading and it could be that BP could responsible for at least part of her symptoms.   She does have slight and intermittent L beating nystagmus with gaze stabilization testing.   Gaze stabilization testing increases her dizziness, especially when she is walking.   She has difficulty focusing on distant object and walking toward it with head  turns.  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  She has some postural deficits as well which could be contributing to her neck pain.   PT is necessary for deficits in cervical ROM, BUE strength/stability, gaze stability, and neck pain/migraines.   Patient is agreeable to the POC  OBJECTIVE IMPAIRMENTS: decreased activity tolerance, dizziness, increased muscle spasms, impaired flexibility, and pain.   ACTIVITY LIMITATIONS: bending, transfers, and turning or quick direction changes, driving  PARTICIPATION LIMITATIONS: meal prep, cleaning, laundry, driving, shopping, and occupation  PERSONAL FACTORS: Age, Fitness, Time since onset of injury/illness/exacerbation, and 1-2 comorbidities: DM, fibromyalgia, chronic LBP,  chronic R hip pain, chronic SI joint pain, HTN are also affecting patient's functional outcome.   REHAB POTENTIAL: Good  CLINICAL  DECISION MAKING: Evolving/moderate complexity  EVALUATION COMPLEXITY: Moderate   PLAN:  PT FREQUENCY: 1-2x/week  PT DURATION: 8 weeks  PLANNED INTERVENTIONS: 97164- PT Re-evaluation, 97750- Physical Performance Testing, 97110-Therapeutic exercises, 97530- Therapeutic activity, [id number]- Neuromuscular re-education, 97535- Self Care, 02859- Manual therapy, [phone]- Canalith repositioning, [id number]- Electrical stimulation (unattended), 97035- Ultrasound, 02987- Traction (mechanical), 20560 (1-2 muscles), 20561 (3+ muscles)- Dry Needling, Patient/Family education, Cryotherapy, and Moist heat  PLAN FOR NEXT SESSION: see how HEP is affecting her symptoms;  progress to standing or walking activities with gaze stabilization;  upper thoracic mobilizations, check suboccipitals, upper trap MFR;  postural strengthening   Kaisley Stiverson, PT 07/14/2024, 9:07 PM  "

## 2024-07-14 ENCOUNTER — Ambulatory Visit: Attending: Neurology | Admitting: Rehabilitation

## 2024-07-14 ENCOUNTER — Encounter: Payer: Self-pay | Admitting: Rehabilitation

## 2024-07-14 ENCOUNTER — Other Ambulatory Visit: Payer: Self-pay

## 2024-07-14 DIAGNOSIS — G43809 Other migraine, not intractable, without status migrainosus: Secondary | ICD-10-CM | POA: Diagnosis present

## 2024-07-14 DIAGNOSIS — R42 Dizziness and giddiness: Secondary | ICD-10-CM | POA: Diagnosis present

## 2024-07-14 DIAGNOSIS — M6281 Muscle weakness (generalized): Secondary | ICD-10-CM | POA: Insufficient documentation

## 2024-07-14 DIAGNOSIS — R293 Abnormal posture: Secondary | ICD-10-CM | POA: Insufficient documentation

## 2024-07-21 ENCOUNTER — Encounter: Admitting: Physical Medicine and Rehabilitation

## 2024-07-26 ENCOUNTER — Ambulatory Visit: Admitting: Rehabilitation

## 2024-07-27 NOTE — Therapy (Incomplete)
 " OUTPATIENT PHYSICAL THERAPY VESTIBULAR TREATMENT     Patient Name: Lauren Soto MRN: [id number] DOB:[birth year], 54 y.o., female Today's Date: 07/27/2024  END OF SESSION:    Past Medical History:  Diagnosis Date   Acute cystitis 02/03/2023   AKI (acute kidney injury) 02/02/2023   Asthma 04/21/2014   Chronic right hip pain 11/06/2022   Diabetes mellitus without complication (HCC)    Essential hypertension 05/25/2012   GERD (gastroesophageal reflux disease) 02/03/2023   Hyperlipidemia 11/06/2022   Hypertension    Long term current use of oral hypoglycemic drug 11/06/2022   Menorrhagia 06/08/2012   Non-insulin  dependent type 2 diabetes mellitus (HCC) 06/18/2018   Pelvic pain in female 06/08/2012   Postural dizziness with presyncope 02/03/2023   Severe sepsis (HCC) 02/03/2023   Uterine fibroid 05/25/2012   Vertigo    Vestibular migraine 07/06/2018   Past Surgical History:  Procedure Laterality Date   ABDOMINAL HYSTERECTOMY     BUNIONECTOMY     CHOLECYSTECTOMY     HERNIA REPAIR     umblical   SHOULDER ARTHROSCOPY     Patient Active Problem List   Diagnosis Date Noted   Chronic bilateral low back pain with right-sided sciatica 04/21/2024   Elevated antinuclear antibody (ANA) level 04/21/2024   Snoring 04/21/2024   Sleep difficulties 04/21/2024   Diarrhea 03/30/2024   Dysuria 01/19/2024   Polyarthralgia 11/27/2023   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   Fibromyalgia 09/10/2023   Other insomnia 09/10/2023   Obesity (BMI 35.0-39.9 without comorbidity) 03/19/2023   Vertigo    Severe sepsis (HCC) 02/03/2023   Postural dizziness with presyncope 02/03/2023   GERD (gastroesophageal reflux disease) 02/03/2023   Acute cystitis 02/03/2023   AKI (acute kidney injury) 02/02/2023   Long term current use of oral hypoglycemic drug 11/06/2022   Chronic right hip pain 11/06/2022   Hyperlipidemia 11/06/2022   Vestibular migraine 07/06/2018   Non-insulin  dependent  type 2 diabetes mellitus (HCC) 06/18/2018   Asthma 04/21/2014   Menorrhagia 06/08/2012   Pelvic pain in female 06/08/2012   Essential hypertension 05/25/2012   Uterine fibroid 05/25/2012    PCP: Waddell Mon, NP REFERRING PROVIDER: Dr Skeet MART DIAG: [phone] (ICD-10-CM) - Vestibular migraine  THERAPY DIAG:  No diagnosis found.  ONSET DATE: several years ago  Rationale for Evaluation and Treatment: Rehabilitation  SUBJECTIVE:   SUBJECTIVE STATEMENT:   EVAL:  53 y/o patient referred to PT from Dr Skeet for vestibular migraines.  MD office note mentions positional vertigo with possible peripheral vertigo.  She is well known to us  from recent episode of care for LBP/hip pain. unfortunately we were unable to make any improvement in her symptoms, and she hope to get lumbar ESI soon.  Regarding the dizziness, She reports she has had it for several years now.  States the problem started after she took 2 fairly long airplane flights back to back.   States after she returned she began having weakness, dizziness and was diagnosed with vertigo.   She went to ENT and was evaluated, treated and felt a little improvement.   However, the problems have been progressively worsening over recent months.  She takes meclizine  for the dizziness which doesn't help a lot.    She also has migraines for which she takes sumitriptan.  States she gets migraines several times weekly for no apparent reason and just has to lie down.   Dizziness occurs multiple times daily but migraines do seem to follow it.   Bending  over and returning to stand makes her feel like her head is swimming.  Getting up out bed provokes dizziness as well per her report.   She also c/o posterior neck pain.  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   She does have symptoms at work at times and has to sit down until they subside.   She states closing her eyes and lying down helps her migraines, but closing her  eyes makes her dizziness worse.     Pt accompanied by: self  PERTINENT HISTORY: DM, fibromyalgia, chronic LBP,  chronic R hip pain, chronic SI joint pain, HTN  PAIN:  Are you having pain? Yes: NPRS scale: 0/10 at times;  8/10 worst Pain location: upper trap/lateral cervical spine area Pain description: aching, sore, tight Aggravating factors: worse as the day progresses Relieving factors: resting  PRECAUTIONS: Fall  RED FLAGS: None   WEIGHT BEARING RESTRICTIONS: No  FALLS: Has patient fallen in last 6 months? No  LIVING ENVIRONMENT: Lives with: lives with their family Lives in: House/apartment Stairs: see prior evaluations for this info Has following equipment at home: None  PLOF: Independent with gait  PATIENT GOALS: have less dizziness, less migraines, less neck pain  OBJECTIVE:  Note: Objective measures were completed at Evaluation unless otherwise noted.  DIAGNOSTIC FINDINGS: no recent head or neck imaging  COGNITION: Overall cognitive status: Within functional limits for tasks assessed   SENSATION: WFL  EDEMA:  Seems to have some edema in her arms  MUSCLE TONE:  WNL  DTRs:  NT  POSTURE:  rounded shoulders and forward head;  elevated shoulders with very tight appearing upper trapezius muscles; very pronounced upper thoracic kyphosis at T1-3  Cervical ROM:    Active A/PROM (deg) eval  Flexion 80%  Extension 75%  Right lateral flexion 60%  Left lateral flexion 60%  Right rotation 90%  Left rotation 80%  (Blank rows = not tested)  STRENGTH: equal BUE;  but has weakness with BUE shoulder ER, shoulder flexoin, shoulder HABD of about   UPPER EXTREMITY MMT:  MMT Right eval Left eval  Shoulder flexion 4- 4-  Shoulder extension 4- 4-  Shoulder abduction 4- 4-  Shoulder adduction    Shoulder extension    Shoulder internal rotation 4+ 4+  Shoulder external rotation 4 4  Middle trapezius 4- 4-  Lower trapezius    Elbow flexion 4+ 4+  Elbow  extension 4+ 4+  Wrist flexion    Wrist extension    Wrist ulnar deviation    Wrist radial deviation    Wrist pronation    Wrist supination    Grip strength =BUE    (Blank rows = not tested)   BED MOBILITY:  Sit to supine Complete Independence Supine to sit Complete Independence Rolling to Right Complete Independence Rolling to Left Complete Independence  GAIT: Gait pattern: short steps, slower cadence, holds head very still with no rotation---turns her body if she needs to turn to look at something Distance walked: 150' into clinic Assistive device utilized: None Level of assistance: Complete Independence Comments:   FUNCTIONAL TESTS:  TBD  PATIENT SURVEYS:  DHI: THE DIZZINESS HANDICAP INVENTORY (DHI)  P1. Does looking up increase your problem? 4 = Yes  E2. Because of your problem, do you feel frustrated? 2 = Sometimes  F3. Because of your problem, do you restrict your travel for business or recreation?  4 = Yes  P4. Does walking down the aisle of  a supermarket increase your problems?  4 = Yes  F5. Because of your problem, do you have difficulty getting into or out of bed?  4 = Yes  F6. Does your problem significantly restrict your participation in social activities, such as going out to dinner, going to the movies, dancing, or going to parties? 4 = Yes  F7. Because of your problem, do you have difficulty reading?  4 = Yes  P8. Does performing more ambitious activities such as sports, dancing, household chores (sweeping or putting dishes away) increase your problems?  4 = Yes  E9. Because of your problem, are you afraid to leave your home without having without having someone accompany you?  4 = Yes  E10. Because of your problem have you been embarrassed in front of others?  4 = Yes  P11. Do quick movements of your head increase your problem?  4 = Yes  F12. Because of your problem, do you avoid heights?  4 = Yes  P13. Does turning over in bed increase your problem?  4 = Yes   F14. Because of your problem, is it difficult for you to do strenuous homework or yard work? 4 = Yes  E15. Because of your problem, are you afraid people may think you are intoxicated? 4 = Yes  F16. Because of your problem, is it difficult for you to go for a walk by yourself?  4 = Yes  P17. Does walking down a sidewalk increase your problem?  4 = Yes  E18.Because of your problem, is it difficult for you to concentrate 4 = Yes  F19. Because of your problem, is it difficult for you to walk around your house in the dark? 4 = Yes  E20. Because of your problem, are you afraid to stay home alone?  4 = Yes  E21. Because of your problem, do you feel handicapped? 4 = Yes  E22. Has the problem placed stress on your relationships with members of your family or friends? 4 = Yes  E23. Because of your problem, are you depressed?  2 = Sometimes  F24. Does your problem interfere with your job or household responsibilities?  4 = Yes  P25. Does bending over increase your problem?  4 = Yes  TOTAL 96/100    DHI Scoring Instructions  The patient is asked to answer each question as it pertains to dizziness or unsteadiness problems, specifically  considering their condition during the last month. Questions are designed to incorporate functional (F), physical  (P), and emotional (E) impacts on disability.   Scores greater than 10 points should be referred to balance specialists for further evaluation.   16-34 Points (mild handicap)  36-52 Points (moderate handicap)  54+ Points (severe handicap)  Minimally Detectable Change: 17 points ([address] [place], 1990)  Poynette, G. SHAUNNA. and Norris, C. W. (1990). The development of the Dizziness Handicap Inventory. Archives of Otolaryngology - Head and Neck Surgery 116(4): [id number].   VESTIBULAR ASSESSMENT:  GENERAL OBSERVATION:    SYMPTOM BEHAVIOR:  Subjective history: see subjective note above  Non-Vestibular symptoms: changes in vision, neck pain, headaches,  nausea/vomiting, and migraine symptoms  Type of dizziness: Spinning/Vertigo, Unsteady with head/body turns, Lightheadedness/Faint, and Swimmyheaded  Frequency: multiple times daily with various movements  Duration: difficult for patient to tell, but states sometimes it takes a while to go away, not immediate resolution per her report  Aggravating factors: Induced by position change: sit to stand and turning around quickly  Relieving factors:  head stationary, lying supine, rest, slow movements, and avoid busy/distracting environments  Progression of symptoms: unchanged  OCULOMOTOR EXAM:  Ocular Alignment: normal  Ocular ROM: No Limitations  Spontaneous Nystagmus: absent  Gaze-Induced Nystagmus: absent  Smooth Pursuits: intact  Saccades: intact  Convergence/Divergence: NT cm   Cover-cross-cover test: Normal  VESTIBULAR - OCULAR REFLEX:   Slow VOR: Positive Left  VOR Cancellation: Normal  Head-Impulse Test: HIT Right: negative HIT Left: negative  Dynamic Visual Acuity: nt   POSITIONAL TESTING: Right Dix-Hallpike: no nystagmus Left Dix-Hallpike: no nystagmus Right Roll Test: no nystagmus Left Roll Test: no nystagmus Right Sidelying: no nystagmus Left Sidelying: no nystagmus  MOTION SENSITIVITY:  Motion Sensitivity Quotient Intensity: 0 = none, 1 = Lightheaded, 2 = Mild, 3 = Moderate, 4 = Severe, 5 = Vomiting  Intensity  1. Sitting to supine   2. Supine to L side   3. Supine to R side   4. Supine to sitting   5. L Hallpike-Dix   6. Up from L    7. R Hallpike-Dix   8. Up from R    9. Sitting, head tipped to L knee   10. Head up from L knee   11. Sitting, head tipped to R knee   12. Head up from R knee   13. Sitting head turns x5   14.Sitting head nods x5   15. In stance, 180 turn to L    16. In stance, 180 turn to R     OTHOSTATICS: Sitting 140/95;   Standing 175/95 (messaged PCP this reading)  FUNCTIONAL GAIT: NT                                                                                                                              TREATMENT DATE:   Foam roll longitudinal stretching/stab/strength Foam roll horizontal mobs  Gaze stabilization x 2 with walking  Subocc release Cervical jt mobs  07/14/24 SELF CARE: Provided education on PT POC progression; initial HEP  With instructions to start with seated gaze stabilization x 1 week, then progress to standing gaze stabilization   PATIENT EDUCATION: Education details: initial HEP Person educated: Patient Education method: Explanation, Demonstration, Tactile cues, Verbal cues, and Handouts Education comprehension: verbalized understanding, verbal cues required, tactile cues required, and needs further education  HOME EXERCISE PROGRAM: Access Code: [email or web address] URL: [email or web address] Date: 07/14/2024 Prepared by: Garnette Montclair  Exercises - Seated Gaze Stabilization with Head Rotation  - 1 x daily - 7 x weekly - 3 sets - 10 reps - Seated Gaze Stabilization with Head Nod  - 1 x daily - 7 x weekly - 3 sets - 10 reps - Standing Gaze Stabilization with Head Rotation  - 1 x daily - 7 x weekly - 3 sets - 10 reps - Standing Gaze Stabilization with Head Nod  - 1 x daily - 7 x weekly - 3 sets - 10 reps - Seated Thoracic Self Mobilization  -  1 x daily - 7 x weekly - 1 sets - 10 reps - Standing Cervical Retraction  - 1 x daily - 7 x weekly - 1 sets - 10 reps - Seated Cervical Retraction and Extension  - 1 x daily - 7 x weekly - 1 sets - 10 reps   GOALS: Goals reviewed with patient? Yes  SHORT TERM GOALS: Target date: 08/11/2024   Patient will report 25-50% subjective improvement in neck pain Baseline: 8/10 worst Goal status: INITIAL  2.  Patient will demonstrate improvement in cervical ROM to 100% all planes Baseline: see ROM tables above Goal status: INITIAL  3.  Patient will be independent with initial HEP Baseline: requires assist from PT for correct  completion Goal status: INITIAL  4.  Patient will report 25-50% subjective improvement in symptoms of dizziness Baseline: multiple episodes daily Goal status: INITIAL   LONG TERM GOALS: Target date: 09/08/2024   Patient will report 75% improvement in cervical pain Baseline: 8/10 worst Goal status: INITIAL  2.  Patient will report 75% subjective improvement in symptoms of dizziness Baseline:  Goal status: INITIAL  3.  Patient will have 5/5 BUE strength for postural and scapular stability to decrease cervicogenic HA Baseline: see strength tables above Goal status: INITIAL  4.  Patient will improve to </= 70% on DHI scale for improved QOL Baseline: 96% Goal status: INITIAL    ASSESSMENT:  CLINICAL IMPRESSION:   EVAL:  Patient is seen today for physical therapy evaluation and treatment for vestibular migraines at request of Dr Skeet.   She reports long h/o dizziness, migraines, and neck pain.   She attributes some of her dizziness to positional changes like bending over or getting up from sitting.   However, she does not exhibit any symptoms of vertigo with positional testing today.   Hallpike-Dix, turning head S/S in supine, and roll testing are all negative without any observable nystagmus.   Her BP is elevated though at 175/95.  I have informed her PCP of this reading and it could be that BP could responsible for at least part of her symptoms.   She does have slight and intermittent L beating nystagmus with gaze stabilization testing.   Gaze stabilization testing increases her dizziness, especially when she is walking.   She has difficulty focusing on distant object and walking toward it with head turns.  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  She has some postural deficits as well which could be contributing to her neck pain.   PT is necessary for deficits in cervical ROM, BUE strength/stability, gaze stability, and neck pain/migraines.   Patient  is agreeable to the POC  OBJECTIVE IMPAIRMENTS: decreased activity tolerance, dizziness, increased muscle spasms, impaired flexibility, and pain.   ACTIVITY LIMITATIONS: bending, transfers, and turning or quick direction changes, driving  PARTICIPATION LIMITATIONS: meal prep, cleaning, laundry, driving, shopping, and occupation  PERSONAL FACTORS: Age, Fitness, Time since onset of injury/illness/exacerbation, and 1-2 comorbidities: DM, fibromyalgia, chronic LBP,  chronic R hip pain, chronic SI joint pain, HTN are also affecting patient's functional outcome.   REHAB POTENTIAL: Good  CLINICAL DECISION MAKING: Evolving/moderate complexity  EVALUATION COMPLEXITY: Moderate   PLAN:  PT FREQUENCY: 1-2x/week  PT DURATION: 8 weeks  PLANNED INTERVENTIONS: 97164- PT Re-evaluation, 97750- Physical Performance Testing, 97110-Therapeutic exercises, 97530- Therapeutic activity, [id number]- Neuromuscular re-education, 97535- Self Care, [id number]- Manual therapy, [id number]- Canalith repositioning, [id number]- Electrical stimulation (unattended), 97035- Ultrasound, 02987- Traction (mechanical), 79439 (1-2 muscles), 20561 (3+ muscles)- Dry  Needling, Patient/Family education, Cryotherapy, and Moist heat  PLAN FOR NEXT SESSION: see how HEP is affecting her symptoms;  progress to standing or walking activities with gaze stabilization;  upper thoracic mobilizations, check suboccipitals, upper trap MFR;  postural strengthening   Nyxon Strupp, PT 07/27/2024, 2:30 PM  "

## 2024-07-28 ENCOUNTER — Ambulatory Visit: Admitting: Rehabilitation

## 2024-07-30 ENCOUNTER — Encounter: Admitting: Physical Medicine & Rehabilitation

## 2024-08-02 ENCOUNTER — Ambulatory Visit: Admitting: Rehabilitation

## 2024-08-05 ENCOUNTER — Ambulatory Visit: Attending: Physical Medicine and Rehabilitation | Admitting: Rehabilitation

## 2024-08-09 ENCOUNTER — Ambulatory Visit: Attending: Physical Medicine and Rehabilitation | Admitting: Rehabilitation

## 2024-08-11 ENCOUNTER — Encounter: Admitting: Physical Medicine and Rehabilitation

## 2024-08-12 ENCOUNTER — Ambulatory Visit: Admitting: Rehabilitation

## 2024-08-16 ENCOUNTER — Ambulatory Visit: Admitting: Rehabilitation

## 2024-08-19 ENCOUNTER — Ambulatory Visit: Admitting: Rehabilitation

## 2024-08-20 ENCOUNTER — Encounter: Admitting: Physical Medicine & Rehabilitation

## 2024-09-06 ENCOUNTER — Encounter: Attending: Physical Medicine & Rehabilitation | Admitting: Physical Medicine and Rehabilitation

## 2024-10-06 ENCOUNTER — Encounter: Admitting: Adult Health

## 2024-10-11 ENCOUNTER — Encounter: Admitting: Adult Health

## 2024-12-22 ENCOUNTER — Ambulatory Visit: Payer: Self-pay | Admitting: Neurology
